# Patient Record
Sex: Male | Born: 1965 | Race: White | Hispanic: No | Marital: Married | Smoking: Current every day smoker
Health system: Southern US, Community
[De-identification: ages and names within clinical notes are randomized; demographics above are authoritative.]

## PROBLEM LIST (undated history)

## (undated) DIAGNOSIS — K219 Gastro-esophageal reflux disease without esophagitis: Secondary | ICD-10-CM

## (undated) DIAGNOSIS — Z87442 Personal history of urinary calculi: Secondary | ICD-10-CM

## (undated) DIAGNOSIS — F419 Anxiety disorder, unspecified: Secondary | ICD-10-CM

## (undated) DIAGNOSIS — I10 Essential (primary) hypertension: Secondary | ICD-10-CM

## (undated) DIAGNOSIS — K5792 Diverticulitis of intestine, part unspecified, without perforation or abscess without bleeding: Secondary | ICD-10-CM

## (undated) DIAGNOSIS — E7800 Pure hypercholesterolemia, unspecified: Secondary | ICD-10-CM

## (undated) DIAGNOSIS — Z9861 Coronary angioplasty status: Principal | ICD-10-CM

## (undated) DIAGNOSIS — I499 Cardiac arrhythmia, unspecified: Secondary | ICD-10-CM

## (undated) DIAGNOSIS — M1990 Unspecified osteoarthritis, unspecified site: Secondary | ICD-10-CM

## (undated) DIAGNOSIS — R079 Chest pain, unspecified: Secondary | ICD-10-CM

## (undated) DIAGNOSIS — I2510 Atherosclerotic heart disease of native coronary artery without angina pectoris: Principal | ICD-10-CM

## (undated) HISTORY — PX: TONSILLECTOMY: SUR1361

## (undated) HISTORY — PX: COLONOSCOPY: SHX174

## (undated) HISTORY — DX: Coronary angioplasty status: Z98.61

## (undated) HISTORY — DX: Atherosclerotic heart disease of native coronary artery without angina pectoris: I25.10

---

## 2002-10-28 ENCOUNTER — Emergency Department (HOSPITAL_COMMUNITY): Admission: EM | Admit: 2002-10-28 | Discharge: 2002-10-28 | Payer: Self-pay | Admitting: *Deleted

## 2002-10-28 ENCOUNTER — Encounter: Payer: Self-pay | Admitting: *Deleted

## 2004-03-12 ENCOUNTER — Emergency Department (HOSPITAL_COMMUNITY): Admission: EM | Admit: 2004-03-12 | Discharge: 2004-03-12 | Payer: Self-pay | Admitting: Emergency Medicine

## 2004-05-20 ENCOUNTER — Emergency Department (HOSPITAL_COMMUNITY): Admission: EM | Admit: 2004-05-20 | Discharge: 2004-05-20 | Payer: Self-pay | Admitting: Emergency Medicine

## 2004-05-22 ENCOUNTER — Emergency Department (HOSPITAL_COMMUNITY): Admission: EM | Admit: 2004-05-22 | Discharge: 2004-05-22 | Payer: Self-pay | Admitting: *Deleted

## 2004-05-23 ENCOUNTER — Inpatient Hospital Stay (HOSPITAL_COMMUNITY): Admission: EM | Admit: 2004-05-23 | Discharge: 2004-05-26 | Payer: Self-pay | Admitting: Emergency Medicine

## 2004-11-12 ENCOUNTER — Emergency Department (HOSPITAL_COMMUNITY): Admission: EM | Admit: 2004-11-12 | Discharge: 2004-11-12 | Payer: Self-pay | Admitting: Emergency Medicine

## 2005-07-11 ENCOUNTER — Emergency Department (HOSPITAL_COMMUNITY): Admission: EM | Admit: 2005-07-11 | Discharge: 2005-07-11 | Payer: Self-pay | Admitting: Gastroenterology

## 2005-07-18 ENCOUNTER — Emergency Department (HOSPITAL_COMMUNITY): Admission: EM | Admit: 2005-07-18 | Discharge: 2005-07-18 | Payer: Self-pay | Admitting: Emergency Medicine

## 2005-07-26 ENCOUNTER — Emergency Department (HOSPITAL_COMMUNITY): Admission: EM | Admit: 2005-07-26 | Discharge: 2005-07-26 | Payer: Self-pay | Admitting: Emergency Medicine

## 2005-10-04 ENCOUNTER — Emergency Department (HOSPITAL_COMMUNITY): Admission: EM | Admit: 2005-10-04 | Discharge: 2005-10-04 | Payer: Self-pay | Admitting: Emergency Medicine

## 2005-12-19 ENCOUNTER — Emergency Department (HOSPITAL_COMMUNITY): Admission: EM | Admit: 2005-12-19 | Discharge: 2005-12-19 | Payer: Self-pay | Admitting: Emergency Medicine

## 2007-06-03 ENCOUNTER — Emergency Department (HOSPITAL_COMMUNITY): Admission: EM | Admit: 2007-06-03 | Discharge: 2007-06-03 | Payer: Self-pay | Admitting: Family Medicine

## 2008-05-28 ENCOUNTER — Emergency Department (HOSPITAL_COMMUNITY): Admission: EM | Admit: 2008-05-28 | Discharge: 2008-05-28 | Payer: Self-pay | Admitting: Emergency Medicine

## 2008-05-29 ENCOUNTER — Emergency Department (HOSPITAL_COMMUNITY): Admission: EM | Admit: 2008-05-29 | Discharge: 2008-05-29 | Payer: Self-pay | Admitting: Emergency Medicine

## 2008-09-15 ENCOUNTER — Emergency Department (HOSPITAL_COMMUNITY): Admission: EM | Admit: 2008-09-15 | Discharge: 2008-09-15 | Payer: Self-pay | Admitting: *Deleted

## 2009-05-13 HISTORY — PX: TRANSTHORACIC ECHOCARDIOGRAM: SHX275

## 2009-08-29 ENCOUNTER — Ambulatory Visit (HOSPITAL_COMMUNITY): Admission: RE | Admit: 2009-08-29 | Discharge: 2009-08-29 | Payer: Self-pay | Admitting: Gastroenterology

## 2009-09-02 ENCOUNTER — Ambulatory Visit (HOSPITAL_COMMUNITY): Admission: RE | Admit: 2009-09-02 | Discharge: 2009-09-02 | Payer: Self-pay | Admitting: Gastroenterology

## 2009-09-07 DIAGNOSIS — Z9861 Coronary angioplasty status: Secondary | ICD-10-CM

## 2009-09-07 DIAGNOSIS — I2510 Atherosclerotic heart disease of native coronary artery without angina pectoris: Secondary | ICD-10-CM

## 2009-09-07 HISTORY — DX: Coronary angioplasty status: Z98.61

## 2009-09-07 HISTORY — DX: Atherosclerotic heart disease of native coronary artery without angina pectoris: I25.10

## 2009-09-07 HISTORY — PX: CORONARY ANGIOPLASTY WITH STENT PLACEMENT: SHX49

## 2009-09-09 ENCOUNTER — Encounter: Admission: RE | Admit: 2009-09-09 | Discharge: 2009-09-09 | Payer: Self-pay | Admitting: Cardiology

## 2009-09-15 DIAGNOSIS — I2510 Atherosclerotic heart disease of native coronary artery without angina pectoris: Secondary | ICD-10-CM | POA: Diagnosis present

## 2009-09-16 ENCOUNTER — Inpatient Hospital Stay (HOSPITAL_COMMUNITY): Admission: AD | Admit: 2009-09-16 | Discharge: 2009-09-17 | Payer: Self-pay | Admitting: Cardiology

## 2011-01-04 HISTORY — PX: NM MYOCAR PERF WALL MOTION: HXRAD629

## 2011-01-09 LAB — CBC
HCT: 41.1 % (ref 39.0–52.0)
Hemoglobin: 14.4 g/dL (ref 13.0–17.0)
MCHC: 34.9 g/dL (ref 30.0–36.0)
MCV: 91.7 fL (ref 78.0–100.0)
Platelets: 169 10*3/uL (ref 150–400)
RBC: 4.49 MIL/uL (ref 4.22–5.81)
RDW: 12.4 % (ref 11.5–15.5)
WBC: 8.3 10*3/uL (ref 4.0–10.5)

## 2011-01-09 LAB — BASIC METABOLIC PANEL
BUN: 10 mg/dL (ref 6–23)
CO2: 25 mEq/L (ref 19–32)
Calcium: 8.6 mg/dL (ref 8.4–10.5)
Chloride: 106 mEq/L (ref 96–112)
Creatinine, Ser: 0.98 mg/dL (ref 0.4–1.5)
GFR calc Af Amer: >60 mL/min (ref >60)
GFR calc non Af Amer: >60 mL/min (ref >60)
Glucose, Bld: 93 mg/dL (ref 70–99)
Potassium: 4.4 mEq/L (ref 3.5–5.1)
Sodium: 137 mEq/L (ref 135–145)

## 2011-01-09 LAB — CARDIAC PANEL(CRET KIN+CKTOT+MB+TROPI)
CK, MB: 0.9 ng/mL (ref 0.3–4.0)
CK, MB: 1.1 ng/mL (ref 0.3–4.0)
Relative Index: INVALID (ref 0.0–2.5)
Relative Index: INVALID (ref 0.0–2.5)
Total CK: 68 U/L (ref 7–232)
Total CK: 78 U/L (ref 7–232)
Troponin I: 0.02 ng/mL (ref 0.00–0.06)
Troponin I: 0.02 ng/mL (ref 0.00–0.06)

## 2011-02-23 NOTE — Discharge Summary (Signed)
Eric Savage, Eric Savage              ACCOUNT NO.:  [id number]   MEDICAL RECORD NO.:  [id number]          PATIENT TYPE:  INP   LOCATION:  A331                          FACILITY:  [facility]   PHYSICIAN:  Patrica Duel, M.D.    DATE OF BIRTH:  01-12-1966   DATE OF ADMISSION:  05/23/2004  DATE OF DISCHARGE:  08/19/2005LH                                 DISCHARGE SUMMARY   DISCHARGE DIAGNOSES:  1.  Refractory cellulitis, secondary to abrasion of his left arm.  Possible      sepsis.  2.  Question the validity of blood culture result.   For details regarding admission, please refer to the admitting note.  Briefly, this 45 year old male with a totally benign history, from a  trailer, striking his left arm on a large rock approximately days prior to  this admission.   The patient sustained a deep abrasion as well as puncture wound, which was  contaminated with debris.  He was seen in the emergency department the day  of the injury, and the wound was thoroughly irrigated and loosely  approximated with suture.  He was given Keflex prophylactically.  He was  seen again in the emergency department and administered Ancef IV.  He  returned back to the emergency department on the day of admission with  increasingly severe edema, pain and obvious cellulitis of his left arm.  He  was admitted for IV antibiotics.   HOSPITAL COURSE:  The patient was empirically treated with Levaquin and  doxycycline.  Dr. Romeo Apple was consulted who agreed with current management.  He developed fever, and a blood culture was positive for gram-positive rods.  His fever promptly resolved.  Dr. Romeo Apple left town.  Dr. Hilda Lias saw the patient in consultation and  felt the management was appropriate at that time.  He began to improve and  continued to improve until the day of discharge.  He was stable for  discharge on the fifth hospital day.  Appropriate wound care was undertaken  at the onset of this admission, and he has  responded well.   DISCHARGE MEDICATIONS:  1.  Levaquin 500 daily.  2.  Doxycycline 100 b.i.d.  3.  Vicodin p.r.n. pain.   FOLLOWUP:  He will be followed closely in the office and treated expectantly  as an outpatient.      MC/MEDQ  D:  07/18/2004  T:  07/18/2004  Job:  [id number]

## 2011-02-23 NOTE — H&P (Signed)
NAME:  Eric Savage, Eric Savage                        ACCOUNT NO.:  [id number]   MEDICAL RECORD NO.:  [id number]                   PATIENT TYPE:  INP   LOCATION:  A331                                 FACILITY:  [facility]   PHYSICIAN:  Patrica Duel, M.D.                 DATE OF BIRTH:  1966/03/06   DATE OF ADMISSION:  05/23/2004  DATE OF DISCHARGE:                                HISTORY & PHYSICAL   CHIEF COMPLAINT:  Arm pain.   HISTORY OF PRESENT ILLNESS:  This is a 45 year old male with a benign past  history.   The patient presented to the emergency department four days prior to this  admission after having fallen from a trailer, striking his left arm on a  large rock.  He sustained a deep abrasion as well as a puncture wound which  was contaminated with debris.  In the emergency department, the patient saw  P.A. Triplett.  The wound was thoroughly irrigated and loosely approximated  with sutures.  He was given Keflex prophylactically.  He followed up as  instructed and was found to have developing cellulitis.  He was given Ancef  IV.  He returned to the emergency department once again with increasingly  severe swelling and pain of his left arm.   Given the refractory nature of the cellulitis, the patient is admitted for  IV antibiotics.  We also need to consider the possibility of a septic joint  as well as retained foreign body contributing to refractory cellulitis.   There is no history of headache, neurologic deficits, chest pain, shortness  of breath, nausea, vomiting, diarrhea, melena, hematemesis, hematochezia, or  genitourinary symptoms.   CURRENT MEDICATIONS:  None.   ALLERGIES:  NONE.   PAST MEDICAL HISTORY:  As noted.  He underwent tonsillectomy as a young  child.   FAMILY HISTORY:  Noncontributory.   REVIEW OF SYSTEMS:  Negative, except as mentioned.   PHYSICAL EXAMINATION:  GENERAL:  A very pleasant male in no acute distress.  VITAL SIGNS:  The patient was febrile  upon presentation with a temperature  of 100 degrees.  Vitals otherwise normal.  HEENT:  Normocephalic, atraumatic.  Pupils are equal.  Ears, nose, and  throat benign.  NECK:  Supple.  No bruits, thyromegaly, or lymphadenopathy.  LUNGS:  Clear to A&P.  HEART:  Heart sounds are normal.  ABDOMEN:  Benign.  EXTREMITIES:  Marked erythema and tenderness of the forearm extending to the  extensor surface of the upper arm.  The joint itself seems somewhat boggy  and tender.  There is a slight discharge from an abrasion which is distal to  the olecranon region.  No frank pus is noted.  No epitrochlear or axillary  nodes are palpable.   ASSESSMENT:  Refractory cellulitis.  Question of septic joint versus  retained foreign body.   PLAN:  IV antibiotics (Levaquin as well as doxycycline).  Dr. Romeo Apple  will  be consulted to possibly explore the wound for debridement as well as to  access the possibility of retained foreign body and consider septic joint.  Will follow and treat expectantly.     ___________________________________________                                         Patrica Duel, M.D.   MC/MEDQ  D:  05/24/2004  T:  05/24/2004  Job:  [id number]

## 2011-02-23 NOTE — Consult Note (Signed)
NAME:  Eric Savage, Eric Savage                        ACCOUNT NO.:  [id number]   MEDICAL RECORD NO.:  [id number]                   PATIENT TYPE:  INP   LOCATION:  A331                                 FACILITY:  [facility]   PHYSICIAN:  J. Darreld Mclean, M.D.              DATE OF BIRTH:  1966-03-19   DATE OF CONSULTATION:  05/26/2004  DATE OF DISCHARGE:                                   CONSULTATION   REQUESTING PHYSICIAN:  Patrica Duel, M.D.   CHIEF COMPLAINT:  Infected left arm.   HISTORY:  The patient had fallen over a trailer and had multiple small loose  bodies in his left forearm near the elbow area on the posterior aspect.  He  has had an infection. He was seen by Dr. Romeo Apple on the 17th.  Cultures  have been obtained and these are negative.  His blood cultures were  bacillus. The patient is afebrile. He is on Vibramycin and has had  significant improvement in the wound.  The question is, does he need a  debridement.  Dr. Romeo Apple is out of town and Dr. Nobie Putnam has asked that I  see the patient.   The patient says that the swelling is going down progressively.  It is even  much less swollen than it was this morning.  He has full range of motion, in  fact, I saw him downstairs near x-ray when I was walking into the hospital.  The patient recognized me.  He has some slight swelling around his hand.  The patient says that you can see his knuckles now, and this morning he  could not.  He has full motion of his fingers.  He said this morning he  could barely make a fist.  He is feeling much better.  The wound, itself,  looks good.  There is no erythema nor purulence.  I have reviewed the x-rays  and these are negative except for soft tissue swelling. He has excellent  range of motion of the elbow with some tenderness, full supination, and full  flexion.  He lacks full extension by approximately 5 degrees. He does have  some slight erythema and slight edema. There is no discharge.   Cultures today are negative except for 1 blood culture which is showing only  a bacillus species.  He is afebrile and has been so for the last 2 days. He  appears to have had excellent response.  He has been shifted to Vibramycin.   IMPRESSION:  Resolving infection left elbow, responding well to Vibramycin.   PLAN:  Continue IV with Vibramycin for at least 6-7 more days.  Would not  recommend an incision, drainage, or debridement at this time.  The patient  says that he has a job in [place].  He has to start Monday, he has to be  there Monday.  Arrangements could be made for IV therapy as an outpatient.  I have  talked to pharmacy and they said that it could be arranged if the  times were set and arranged for the patient's benefit and comfort.   If the wound gets worse or any troubles they are to let me know.  Otherwise  I think that he will be okay to be discharged as an outpatient.     ___________________________________________                                            Teola Bradley, M.D.   JWK/MEDQ  D:  05/26/2004  T:  05/26/2004  Job:  [id number]

## 2011-07-13 LAB — URINALYSIS, ROUTINE W REFLEX MICROSCOPIC
Glucose, UA: NEGATIVE mg/dL
Ketones, ur: NEGATIVE mg/dL
Leukocytes, UA: NEGATIVE
Nitrite: NEGATIVE
Protein, ur: 30 mg/dL — AB
Specific Gravity, Urine: 1.037 — ABNORMAL HIGH (ref 1.005–1.030)
Urobilinogen, UA: 0.2 mg/dL (ref 0.0–1.0)
pH: 6 (ref 5.0–8.0)

## 2011-07-13 LAB — URINE MICROSCOPIC-ADD ON

## 2012-05-16 ENCOUNTER — Inpatient Hospital Stay (HOSPITAL_COMMUNITY)
Admission: EM | Admit: 2012-05-16 | Discharge: 2012-05-17 | DRG: 133 | Disposition: A | Discharge status: Home / Self Care | Payer: BC Managed Care – PPO | Attending: Cardiology | Admitting: Cardiology

## 2012-05-16 ENCOUNTER — Emergency Department (HOSPITAL_COMMUNITY): Payer: BC Managed Care – PPO

## 2012-05-16 ENCOUNTER — Encounter (HOSPITAL_COMMUNITY): Payer: Self-pay | Admitting: *Deleted

## 2012-05-16 ENCOUNTER — Other Ambulatory Visit: Payer: Self-pay

## 2012-05-16 DIAGNOSIS — R7309 Other abnormal glucose: Secondary | ICD-10-CM | POA: Diagnosis present

## 2012-05-16 DIAGNOSIS — I10 Essential (primary) hypertension: Secondary | ICD-10-CM | POA: Diagnosis present

## 2012-05-16 DIAGNOSIS — Z9861 Coronary angioplasty status: Secondary | ICD-10-CM

## 2012-05-16 DIAGNOSIS — K219 Gastro-esophageal reflux disease without esophagitis: Secondary | ICD-10-CM | POA: Diagnosis present

## 2012-05-16 DIAGNOSIS — I2510 Atherosclerotic heart disease of native coronary artery without angina pectoris: Principal | ICD-10-CM | POA: Diagnosis present

## 2012-05-16 DIAGNOSIS — E785 Hyperlipidemia, unspecified: Secondary | ICD-10-CM | POA: Diagnosis present

## 2012-05-16 DIAGNOSIS — F17200 Nicotine dependence, unspecified, uncomplicated: Secondary | ICD-10-CM | POA: Diagnosis present

## 2012-05-16 DIAGNOSIS — I200 Unstable angina: Secondary | ICD-10-CM

## 2012-05-16 HISTORY — DX: Essential (primary) hypertension: I10

## 2012-05-16 HISTORY — DX: Gastro-esophageal reflux disease without esophagitis: K21.9

## 2012-05-16 LAB — URINALYSIS, ROUTINE W REFLEX MICROSCOPIC
Bilirubin Urine: NEGATIVE
Glucose, UA: NEGATIVE mg/dL
Hgb urine dipstick: NEGATIVE
Ketones, ur: NEGATIVE mg/dL
Leukocytes, UA: NEGATIVE
Nitrite: NEGATIVE
Protein, ur: NEGATIVE mg/dL
Specific Gravity, Urine: 1.024 (ref 1.005–1.030)
Urobilinogen, UA: 1 mg/dL (ref 0.0–1.0)
pH: 6.5 (ref 5.0–8.0)

## 2012-05-16 LAB — CBC
HCT: 45.3 % (ref 39.0–52.0)
Hemoglobin: 15.5 g/dL (ref 13.0–17.0)
MCH: 30.9 pg (ref 26.0–34.0)
MCHC: 34.2 g/dL (ref 30.0–36.0)
MCV: 90.2 fL (ref 78.0–100.0)
Platelets: 218 10*3/uL (ref 150–400)
RBC: 5.02 MIL/uL (ref 4.22–5.81)
RDW: 12.3 % (ref 11.5–15.5)
WBC: 9.9 10*3/uL (ref 4.0–10.5)

## 2012-05-16 LAB — POCT I-STAT, CHEM 8
BUN: 16 mg/dL (ref 6–23)
Calcium, Ion: 1.25 mmol/L — ABNORMAL HIGH (ref 1.12–1.23)
Chloride: 105 mEq/L (ref 96–112)
Creatinine, Ser: 1.1 mg/dL (ref 0.50–1.35)
Glucose, Bld: 159 mg/dL — ABNORMAL HIGH (ref 70–99)
HCT: 45 % (ref 39.0–52.0)
Hemoglobin: 15.3 g/dL (ref 13.0–17.0)
Potassium: 4.1 mEq/L (ref 3.5–5.1)
Sodium: 140 mEq/L (ref 135–145)
TCO2: 23 mmol/L (ref 0–100)

## 2012-05-16 LAB — POCT I-STAT TROPONIN I: Troponin i, poc: 0 ng/mL (ref 0.00–0.08)

## 2012-05-16 LAB — RAPID URINE DRUG SCREEN, HOSP PERFORMED
Amphetamines: NOT DETECTED
Barbiturates: NOT DETECTED
Benzodiazepines: NOT DETECTED
Cocaine: NOT DETECTED
Opiates: NOT DETECTED
Tetrahydrocannabinol: POSITIVE — AB

## 2012-05-16 LAB — TROPONIN I
Troponin I: <0.3 ng/mL (ref <0.30)
Troponin I: <0.3 ng/mL (ref <0.30)

## 2012-05-16 LAB — MRSA PCR SCREENING: MRSA by PCR: NEGATIVE

## 2012-05-16 MED ORDER — METOPROLOL TARTRATE 12.5 MG HALF TABLET
12.5000 mg | ORAL_TABLET | Freq: Two times a day (BID) | ORAL | Status: DC
  Administered 2012-05-16: 12.5 mg via ORAL
  Filled 2012-05-16 (×3): qty 1

## 2012-05-16 MED ORDER — SODIUM CHLORIDE 0.9 % IV SOLN: INTRAVENOUS | Status: DC | PRN

## 2012-05-16 MED ORDER — ASPIRIN 325 MG PO TABS
325.0000 mg | ORAL_TABLET | Freq: Every day | ORAL | Status: DC
  Administered 2012-05-17: 325 mg via ORAL
  Filled 2012-05-16: qty 1

## 2012-05-16 MED ORDER — NITROGLYCERIN IN D5W 200-5 MCG/ML-% IV SOLN
2.0000 ug/min | INTRAVENOUS | Status: DC
  Administered 2012-05-16 (×2): 2 ug/min via INTRAVENOUS
  Filled 2012-05-16: qty 250

## 2012-05-16 MED ORDER — ACETAMINOPHEN 325 MG PO TABS: 650.0000 mg | ORAL_TABLET | ORAL | Status: DC | PRN

## 2012-05-16 MED ORDER — LISINOPRIL 40 MG PO TABS
40.0000 mg | ORAL_TABLET | Freq: Every day | ORAL | Status: DC
  Administered 2012-05-16 – 2012-05-17 (×2): 40 mg via ORAL
  Filled 2012-05-16 (×2): qty 1

## 2012-05-16 MED ORDER — SIMVASTATIN 40 MG PO TABS
40.0000 mg | ORAL_TABLET | Freq: Every day | ORAL | Status: DC
  Filled 2012-05-16: qty 1

## 2012-05-16 MED ORDER — HEPARIN (PORCINE) IN NACL 100-0.45 UNIT/ML-% IJ SOLN
1400.0000 [IU]/h | INTRAMUSCULAR | Status: DC
  Administered 2012-05-16 (×2): 900 [IU]/h via INTRAVENOUS
  Administered 2012-05-17: 1400 [IU]/h via INTRAVENOUS
  Filled 2012-05-16 (×3): qty 250

## 2012-05-16 MED ORDER — PANTOPRAZOLE SODIUM 40 MG PO TBEC
40.0000 mg | DELAYED_RELEASE_TABLET | Freq: Every day | ORAL | Status: DC
  Administered 2012-05-17: 40 mg via ORAL
  Filled 2012-05-16: qty 1

## 2012-05-16 MED ORDER — HEPARIN BOLUS VIA INFUSION
4000.0000 [IU] | Freq: Once | INTRAVENOUS | Status: AC
  Administered 2012-05-16: 4000 [IU] via INTRAVENOUS

## 2012-05-16 MED ORDER — ZOLPIDEM TARTRATE 5 MG PO TABS: 10.0000 mg | ORAL_TABLET | Freq: Every evening | ORAL | Status: DC | PRN

## 2012-05-16 MED ORDER — TRAMADOL HCL 50 MG PO TABS
50.0000 mg | ORAL_TABLET | Freq: Four times a day (QID) | ORAL | Status: DC | PRN
  Filled 2012-05-16: qty 1

## 2012-05-16 MED ORDER — ALPRAZOLAM 0.25 MG PO TABS
0.2500 mg | ORAL_TABLET | Freq: Three times a day (TID) | ORAL | Status: DC | PRN
  Administered 2012-05-16: 0.25 mg via ORAL
  Filled 2012-05-16: qty 1

## 2012-05-16 MED ORDER — CLOPIDOGREL BISULFATE 75 MG PO TABS
75.0000 mg | ORAL_TABLET | Freq: Every day | ORAL | Status: DC
  Administered 2012-05-16 – 2012-05-17 (×2): 75 mg via ORAL
  Filled 2012-05-16 (×2): qty 1

## 2012-05-16 MED ORDER — ASPIRIN 81 MG PO CHEW
324.0000 mg | CHEWABLE_TABLET | Freq: Once | ORAL | Status: AC
  Administered 2012-05-16: 324 mg via ORAL
  Filled 2012-05-16: qty 4

## 2012-05-16 NOTE — ED Notes (Signed)
Phlebotomy at the bedside  

## 2012-05-16 NOTE — ED Notes (Signed)
Pt eating his lunch  

## 2012-05-16 NOTE — ED Provider Notes (Signed)
Medical screening examination/treatment/procedure(s) were performed by non-physician practitioner and as supervising physician I was immediately available for consultation/collaboration.   Richardean Canal, MD 05/16/12 [phone]

## 2012-05-16 NOTE — ED Provider Notes (Signed)
History     CSN: [id number]  Arrival date & time 05/16/12  1353   First MD Initiated Contact with Patient 05/16/12 1501      Chief Complaint  Patient presents with  . Chest Pain    (Consider location/radiation/quality/duration/timing/severity/associated sxs/prior treatment) HPI Hx from pt. Eric Savage is a 46 y.o. male with hx CAD and stenting who presents with chest pain. States that this began yesterday afternoon and was intermittent in nature. Pain lasts for about 5-10 seconds at a time and is aching in nature. It is brought on by exertion. It is located substernally with occasional radiation to the neck and jaw. It is followed by a sensation as if he needs to belch. He states it does not feel c/w acid reflux. He states it does feel somewhat similar to his previous anginal pain. No associated nausea, vomiting, dyspnea, diaphoresis. No medications taken at home prior to coming here.  Main cardiologist is Dr. Clarene Duke, but he has not seen Dr. Clarene Duke in a while. He was last cathed in 2010 by Dr. Tresa Endo and had drug eluting stents placed to the L circumflex and RAD; he had a 50-60% stenosis of the LAD.  Past Medical History  Diagnosis Date  . Acid reflux   . Hypertension   . Coronary artery disease     Past Surgical History  Procedure Date  . Angioplasty     History reviewed. No pertinent family history.  History  Substance Use Topics  . Smoking status: Current Everyday Smoker  . Smokeless tobacco: Not on file  . Alcohol Use: Yes      Review of Systems  Constitutional: Negative for fever, chills and appetite change.  HENT: Positive for neck pain. Negative for congestion, sore throat and trouble swallowing.   Respiratory: Negative for cough, chest tightness, shortness of breath and wheezing.   Cardiovascular: Positive for chest pain. Negative for palpitations and leg swelling.  Gastrointestinal: Negative for nausea, vomiting and abdominal pain.       Belching    Musculoskeletal: Negative for myalgias.  Skin: Negative for color change and rash.  Neurological: Negative for dizziness, syncope and weakness.  All other systems reviewed and are negative.    Allergies  Review of patient's allergies indicates no known allergies.  Home Medications   Current Outpatient Rx  Name Route Sig Dispense Refill  . CLOPIDOGREL BISULFATE 75 MG PO TABS Oral Take 75 mg by mouth daily.    Marland Kitchen ESOMEPRAZOLE MAGNESIUM 40 MG PO CPDR Oral Take 40 mg by mouth daily before breakfast.    . LISINOPRIL 40 MG PO TABS Oral Take 40 mg by mouth daily.      BP 125/71  Pulse 73  Temp 98.6 F (37 C) (Oral)  Resp 14  SpO2 100%  Physical Exam  Nursing note and vitals reviewed. Constitutional: He appears well-developed and well-nourished. No distress.  HENT:  Head: Normocephalic and atraumatic.  Mouth/Throat: Oropharynx is clear and moist. No oropharyngeal exudate.  Eyes:       Normal appearance  Neck: Normal range of motion.  Cardiovascular: Normal rate, regular rhythm and normal heart sounds.   Pulmonary/Chest: Effort normal and breath sounds normal. He exhibits no tenderness.  Abdominal: Soft. Bowel sounds are normal. There is no tenderness. There is no rebound and no guarding.  Musculoskeletal: Normal range of motion.  Neurological: He is alert.  Skin: Skin is warm and dry. He is not diaphoretic.  Psychiatric: He has a normal mood and affect.  ED Course  Procedures (including critical care time)   Date: 05/16/2012  Rate: 73  Rhythm: normal sinus rhythm  QRS Axis: normal  Intervals: normal  ST/T Wave abnormalities: normal  Conduction Disutrbances:none  Narrative Interpretation:   Old EKG Reviewed: since last tracing rate increased  Labs Reviewed  POCT I-STAT, CHEM 8 - Abnormal; Notable for the following:    Glucose, Bld 159 (*)     Calcium, Ion 1.25 (*)     All other components within normal limits  CBC  POCT I-STAT TROPONIN I  URINE RAPID DRUG  SCREEN (HOSP PERFORMED)   Dg Chest 2 View  05/16/2012  *RADIOLOGY REPORT*  Clinical Data: Chest pain.  Neck pain.  Jaw pain and headache.  CHEST - 2 VIEW  Comparison: 09/09/2009.  Findings:  Cardiopericardial silhouette within normal limits. Mediastinal contours normal. Trachea midline.  No airspace disease or effusion.  IMPRESSION: No active cardiopulmonary disease.  Original Report Authenticated By: Andreas Newport, M.D.     No diagnosis found.    MDM  Pt with hx of stents who presents with new exertional chest pain, which feels similar to previous anginal pain. Currently CP free at this time. ECG unchanged from previous. POC troponin negative. Will contact [facility] to discuss.  3:35 PM Discussed with Corine Shelter, PA-C. He and consult team will be by shortly to see.  4:44 PM Pt will be admitted for MI r/o.  Grant Fontana, PA-C 05/16/12 1644

## 2012-05-16 NOTE — ED Notes (Signed)
Substernal cp, belching after the pain; took nothing for pain.

## 2012-05-16 NOTE — ED Notes (Signed)
Requested specimen, patient unable to urinate at this time

## 2012-05-16 NOTE — ED Notes (Signed)
Pt pulled to room from triage and taken to xray

## 2012-05-16 NOTE — Progress Notes (Signed)
ANTICOAGULATION CONSULT NOTE - Initial Consult  Pharmacy Consult for Heparin Indication: chest pain/ACS  No Known Allergies  Patient Measurements: Height: 5\' 6"  (167.6 cm) Weight: 162 lb (73.483 kg) IBW/kg (Calculated) : 63.8   Vital Signs: Temp: 98.6 F (37 C) (08/09 1354) Temp src: Oral (08/09 1354) BP: 136/91 mmHg (08/09 1611) Pulse Rate: 59  (08/09 1611)  Labs:  Basename 05/16/12 1435 05/16/12 1402  HGB 15.3 15.5  HCT 45.0 45.3  PLT -- 218  APTT -- --  LABPROT -- --  INR -- --  HEPARINUNFRC -- --  CREATININE 1.10 --  CKTOTAL -- --  CKMB -- --  TROPONINI -- --    Estimated Creatinine Clearance: 75.7 ml/min (by C-G formula based on Cr of 1.1).   Medical History: Past Medical History  Diagnosis Date  . Acid reflux   . Hypertension   . Coronary artery disease     Medications:  Plavix, Nexium, Lisinopril  Assessment: 46 y.o. male presents with chest pain. H/o CAD with DES 12/10. To begin heparin for r/o ACS.  Goal of Therapy:  Heparin level 0.3-0.7 units/ml Monitor platelets by anticoagulation protocol: Yes   Plan:  1. Heparin 4000 units IV bolus 2. Heparin gtt at 900 units/hr 3. Heparin level in 6 hours 4. Daily heparin level and CBC  Christoper Fabian, PharmD, BCPS Clinical pharmacist, pager [phone] 05/16/2012,4:56 PM

## 2012-05-16 NOTE — H&P (Signed)
Patient ID: Eric Savage MRN: [id number], DOB/AGE: 01/25/1966   Admit date: 05/16/2012   Primary Physician: ELMAHDY,WAGDY, MD Primary Cardiologist: Dr Little  HPI: 46 y/o with a history of CAD. He had an RCA/CFX DES 12/10 after a negative Nuclear study. He had residual LAD and Dx disease. He had an OP Myoview March 2012-"OK". He is seen now in the ER with complaints of two days of SSCP, "lasts 5 seconds", but not unlike his prior anginal symptoms. He did have some radiation to his jaw and throat. No associated N/V/D.   Problem List: Past Medical History  Diagnosis Date  . Acid reflux   . Hypertension   . Coronary artery disease     Past Surgical History  Procedure Date  . Angioplasty      Allergies: No Known Allergies   Home Medications  (Not in a hospital admission)   History reviewed. No pertinent family history.   History   Social History  . Marital Status: Single    Spouse Name: N/A    Number of Children: N/A  . Years of Education: N/A   Occupational History  . Not on file.   Social History Main Topics  . Smoking status: Current Everyday Smoker  . Smokeless tobacco: Not on file  . Alcohol Use: Yes  . Drug Use: 7 per week    Special: Marijuana     LSD  . Sexually Active:    Other Topics Concern  . Not on file   Social History Narrative  . No narrative on file     Review of Systems: General: negative for chills, fever, night sweats or weight changes.  Cardiovascular: negative for chest pain, dyspnea on exertion, edema, orthopnea, palpitations, paroxysmal nocturnal dyspnea or shortness of breath Dermatological: negative for rash Respiratory: negative for cough or wheezing Urologic: negative for hematuria Abdominal: negative for nausea, vomiting, diarrhea, bright red blood per rectum, melena, or hematemesis Neurologic: negative for visual changes, syncope, or dizziness All other systems reviewed and are otherwise negative except as noted  above.  Physical Exam: Blood pressure 136/91, pulse 59, temperature 98.6 F (37 C), temperature source Oral, resp. rate 16, SpO2 98.00%.  General appearance: alert, cooperative, appears stated age and no distress Neck: no carotid bruit, no JVD, supple, symmetrical, trachea midline and thyroid not enlarged, symmetric, no tenderness/mass/nodules Lungs: clear to auscultation bilaterally Heart: regular rate and rhythm Abdomen: soft, non-tender; bowel sounds normal; no masses,  no organomegaly Extremities: extremities normal, atraumatic, no cyanosis or edema Pulses: 2+ and symmetric Skin: Skin color, texture, turgor normal. No rashes or lesions Neurologic: Grossly normal    Labs:   Results for orders placed during the hospital encounter of 05/16/12 (from the past 24 hour(s))  CBC     Status: Normal   Collection Time   05/16/12  2:02 PM      Component Value Range   WBC 9.9  4.0 - 10.5 K/uL   RBC 5.02  4.22 - 5.81 MIL/uL   Hemoglobin 15.5  13.0 - 17.0 g/dL   HCT 45.3  39.0 - 52.0 %   MCV 90.2  78.0 - 100.0 fL   MCH 30.9  26.0 - 34.0 pg   MCHC 34.2  30.0 - 36.0 g/dL   RDW 12.3  11.5 - 15.5 %   Platelets 218  150 - 400 K/uL  POCT I-STAT TROPONIN I     Status: Normal   Collection Time   05/16/12  2:33 PM        Component Value Range   Troponin i, poc 0.00  0.00 - 0.08 ng/mL   Comment 3           POCT I-STAT, CHEM 8     Status: Abnormal   Collection Time   05/16/12  2:35 PM      Component Value Range   Sodium 140  135 - 145 mEq/L   Potassium 4.1  3.5 - 5.1 mEq/L   Chloride 105  96 - 112 mEq/L   BUN 16  6 - 23 mg/dL   Creatinine, Ser 1.10  0.50 - 1.35 mg/dL   Glucose, Bld 159 (*) 70 - 99 mg/dL   Calcium, Ion 1.25 (*) 1.12 - 1.23 mmol/L   TCO2 23  0 - 100 mmol/L   Hemoglobin 15.3  13.0 - 17.0 g/dL   HCT 45.0  39.0 - 52.0 %     Radiology/Studies: Dg Chest 2 View  05/16/2012  *RADIOLOGY REPORT*  Clinical Data: Chest pain.  Neck pain.  Jaw pain and headache.  CHEST - 2 VIEW   Comparison: 09/09/2009.  Findings:  Cardiopericardial silhouette within normal limits. Mediastinal contours normal. Trachea midline.  No airspace disease or effusion.  IMPRESSION: No active cardiopulmonary disease.  Original Report Authenticated By: GEOFFREY LAMKE, M.D.    EKG:NSR without acute changes   ASSESSMENT AND PLAN:  Principal Problem:  *Unstable angina Active Problems:  CAD (coronary artery disease), RCA/CFX DES 12/10 with residual 50-60% LAD.  HTN (hypertension)  Dyslipidemia, untreated  Smoker  Hyperglycemia- BS 159  Plan- Admit, Heparin, NTG, statin, ASA, low dose Metoprolol, R/O MI  Will check Hgb A1c.  Signed, KILROY,LUKE K, PA-C 05/16/2012, 4:24 PM  I have seen & examined the patient along with Mr. Kilroy.  I agree with his findings, exam & recommendations.  Essentially a 46 y/o man with h/o of known CAD - PCI to LCx & RCA in 2010 after "false negative Myoivew" ST.  Has been in [facility] until the past few weeks when he began to note intermittent Chest pains -- always with exertion.  Over the past few days, he has noted them with increased frequency & lasting longer.  Today, the Sx have started to be present @ rest, although always short-lived.  Says he has had a "few" since coming to the ER. He states that these Sx are very similar to his previous chest pains prior to his PCI.    He denies and CHF type Sx of edema, PND, orthopnea.  No palpitations, syncope or near-syncope.  He notes that he ran out of his Plavix Rx "a while ago."  I discussed with him the potential plan for admit - r/o MI, NTG  & Heparin.  Smoking cessation counseling.  As his Sx seem to be progressively worsening in nature and similar to his previous Anginal equivalents Sx, I agree that this should be considered Unstable Angina until proven otherwise.   He has had a false negative Myoview before with CP and a negative once over a year ago without CP.  He is quite skeptical of Myoview results, although with  multivessel disease, the likely reason for false negative reading would be balanced ischemia, which is less likely an issue after 2 vessel revascularization.  He is more in favor of invasive evaluation "to be sure", which is a reasonable approach for definitive anatomic evaluation.  We will admit with plan for LHC +/- PCI on Monday for Unstable Angina.  [name],DAVID W, M.D., M.S. [facility] [address] [place], [place]  [address]  [phone] Pager # [phone]  05/16/2012 5:26 PM   

## 2012-05-17 LAB — HEPARIN LEVEL (UNFRACTIONATED)
Heparin Unfractionated: 0.27 IU/mL — ABNORMAL LOW (ref 0.30–0.70)
Heparin Unfractionated: 0.29 IU/mL — ABNORMAL LOW (ref 0.30–0.70)
Heparin Unfractionated: <0.1 IU/mL — ABNORMAL LOW (ref 0.30–0.70)

## 2012-05-17 LAB — COMPREHENSIVE METABOLIC PANEL
ALT: 9 U/L (ref 0–53)
AST: 12 U/L (ref 0–37)
Albumin: 3.5 g/dL (ref 3.5–5.2)
Alkaline Phosphatase: 69 U/L (ref 39–117)
BUN: 17 mg/dL (ref 6–23)
CO2: 25 mEq/L (ref 19–32)
Calcium: 9.1 mg/dL (ref 8.4–10.5)
Chloride: 106 mEq/L (ref 96–112)
Creatinine, Ser: 0.91 mg/dL (ref 0.50–1.35)
GFR calc Af Amer: >90 mL/min (ref >90)
GFR calc non Af Amer: >90 mL/min (ref >90)
Glucose, Bld: 104 mg/dL — ABNORMAL HIGH (ref 70–99)
Potassium: 4.3 mEq/L (ref 3.5–5.1)
Sodium: 139 mEq/L (ref 135–145)
Total Bilirubin: 0.3 mg/dL (ref 0.3–1.2)
Total Protein: 5.6 g/dL — ABNORMAL LOW (ref 6.0–8.3)

## 2012-05-17 LAB — TROPONIN I: Troponin I: <0.3 ng/mL (ref <0.30)

## 2012-05-17 LAB — CBC
HCT: 42.8 % (ref 39.0–52.0)
Hemoglobin: 14.4 g/dL (ref 13.0–17.0)
MCH: 30.7 pg (ref 26.0–34.0)
MCHC: 33.6 g/dL (ref 30.0–36.0)
MCV: 91.3 fL (ref 78.0–100.0)
Platelets: 202 10*3/uL (ref 150–400)
RBC: 4.69 MIL/uL (ref 4.22–5.81)
RDW: 12.4 % (ref 11.5–15.5)
WBC: 9.1 10*3/uL (ref 4.0–10.5)

## 2012-05-17 LAB — PROTIME-INR
INR: 1.1 (ref 0.00–1.49)
Prothrombin Time: 14.4 seconds (ref 11.6–15.2)

## 2012-05-17 LAB — HEMOGLOBIN A1C
Hgb A1c MFr Bld: 5.5 % (ref <5.7)
Mean Plasma Glucose: 111 mg/dL (ref <117)

## 2012-05-17 MED ORDER — NITROGLYCERIN 0.4 MG SL SUBL: 0.4000 mg | SUBLINGUAL_TABLET | SUBLINGUAL | Status: DC | PRN

## 2012-05-17 MED ORDER — METOPROLOL TARTRATE 25 MG PO TABS: 12.5000 mg | ORAL_TABLET | Freq: Two times a day (BID) | ORAL | Status: DC

## 2012-05-17 MED ORDER — ISOSORBIDE MONONITRATE ER 30 MG PO TB24: 30.0000 mg | ORAL_TABLET | Freq: Every day | ORAL | Status: DC

## 2012-05-17 MED ORDER — HEPARIN BOLUS VIA INFUSION
2000.0000 [IU] | Freq: Once | INTRAVENOUS | Status: AC
  Administered 2012-05-17: 2000 [IU] via INTRAVENOUS
  Filled 2012-05-17: qty 2000

## 2012-05-17 MED ORDER — SIMVASTATIN 40 MG PO TABS: 40.0000 mg | ORAL_TABLET | Freq: Every day | ORAL | Status: DC

## 2012-05-17 MED ORDER — ASPIRIN 325 MG PO TABS: 325.0000 mg | ORAL_TABLET | Freq: Every day | ORAL | Status: AC

## 2012-05-17 NOTE — Discharge Summary (Signed)
Patient ID: CAS TRACZ,  MRN: [id number], DOB/AGE: 46-Jul-1967 46 y.o.  Admit date: 05/16/2012 Discharge date: 05/17/2012  Primary Care Provider:  Primary Cardiologist: Dr Herbie Baltimore  Discharge Diagnoses Principal Problem:  *Unstable angina Active Problems:  CAD (coronary artery disease), RCA/CFX DES 12/10 with residual 50-60% LAD.  HTN (hypertension)  Dyslipidemia, untreated  Smoker    Procedures: None   Hospital Course:  46 y/o with a history of CAD. He had an RCA/CFX DES 12/10 after a negative nuclear study. He had residual LAD and Dx disease of 50-60%. He had an OP Myoview March 2012-"OK". He is seen now in the ER with complaints of two days of SSCP, "lasts 5 seconds", but not unlike his prior anginal symptoms. He did have some radiation to his jaw and throat. No associated N/V/D. He was admitted to stepdown and started on Heparin and Nitro. His enzymes were negative. ASA and statin were added. He was to have a cath on Monday but insisted on going home Sat. Dr Herbie Baltimore spoke with the pt and feels he is OK for discharge on medical Rx with plans for cath Monday or Tuesday as an OP.   Discharge Vitals:  Blood pressure 112/68, pulse 58, temperature 98.2 F (36.8 C), temperature source Oral, resp. rate 18, height 5\' 6"  (1.676 m), weight 73.7 kg (162 lb 7.7 oz), SpO2 99.00%.    Labs: Results for orders placed during the hospital encounter of 05/16/12 (from the past 48 hour(s))  CBC     Status: Normal   Collection Time   05/16/12  2:02 PM      Component Value Range Comment   WBC 9.9  4.0 - 10.5 K/uL    RBC 5.02  4.22 - 5.81 MIL/uL    Hemoglobin 15.5  13.0 - 17.0 g/dL    HCT 78.2  95.6 - 21.3 %    MCV 90.2  78.0 - 100.0 fL    MCH 30.9  26.0 - 34.0 pg    MCHC 34.2  30.0 - 36.0 g/dL    RDW 08.6  57.8 - 46.9 %    Platelets 218  150 - 400 K/uL   POCT I-STAT TROPONIN I     Status: Normal   Collection Time   05/16/12  2:33 PM      Component Value Range Comment   Troponin i, poc 0.00   0.00 - 0.08 ng/mL    Comment 3            POCT I-STAT, CHEM 8     Status: Abnormal   Collection Time   05/16/12  2:35 PM      Component Value Range Comment   Sodium 140  135 - 145 mEq/L    Potassium 4.1  3.5 - 5.1 mEq/L    Chloride 105  96 - 112 mEq/L    BUN 16  6 - 23 mg/dL    Creatinine, Ser 6.29  0.50 - 1.35 mg/dL    Glucose, Bld 528 (*) 70 - 99 mg/dL    Calcium, Ion 4.13 (*) 1.12 - 1.23 mmol/L    TCO2 23  0 - 100 mmol/L    Hemoglobin 15.3  13.0 - 17.0 g/dL    HCT 24.4  01.0 - 27.2 %   URINE RAPID DRUG SCREEN (HOSP PERFORMED)     Status: Abnormal   Collection Time   05/16/12  4:10 PM      Component Value Range Comment   Opiates NONE DETECTED  NONE DETECTED  Cocaine NONE DETECTED  NONE DETECTED    Benzodiazepines NONE DETECTED  NONE DETECTED    Amphetamines NONE DETECTED  NONE DETECTED    Tetrahydrocannabinol POSITIVE (*) NONE DETECTED    Barbiturates NONE DETECTED  NONE DETECTED   TROPONIN I     Status: Normal   Collection Time   05/16/12  4:36 PM      Component Value Range Comment   Troponin I <0.30  <0.30 ng/mL   HEMOGLOBIN A1C     Status: Normal   Collection Time   05/16/12  4:37 PM      Component Value Range Comment   Hemoglobin A1C 5.5  <5.7 %    Mean Plasma Glucose 111  <117 mg/dL   TROPONIN I     Status: Normal   Collection Time   05/16/12  4:56 PM      Component Value Range Comment   Troponin I <0.30  <0.30 ng/mL   URINALYSIS, ROUTINE W REFLEX MICROSCOPIC     Status: Normal   Collection Time   05/16/12  5:02 PM      Component Value Range Comment   Color, Urine YELLOW  YELLOW    APPearance CLEAR  CLEAR    Specific Gravity, Urine 1.024  1.005 - 1.030    pH 6.5  5.0 - 8.0    Glucose, UA NEGATIVE  NEGATIVE mg/dL    Hgb urine dipstick NEGATIVE  NEGATIVE    Bilirubin Urine NEGATIVE  NEGATIVE    Ketones, ur NEGATIVE  NEGATIVE mg/dL    Protein, ur NEGATIVE  NEGATIVE mg/dL    Urobilinogen, UA 1.0  0.0 - 1.0 mg/dL    Nitrite NEGATIVE  NEGATIVE    Leukocytes, UA  NEGATIVE  NEGATIVE MICROSCOPIC NOT DONE ON URINES WITH NEGATIVE PROTEIN, BLOOD, LEUKOCYTES, NITRITE, OR GLUCOSE <1000 mg/dL.  MRSA PCR SCREENING     Status: Normal   Collection Time   05/16/12  8:18 PM      Component Value Range Comment   MRSA by PCR NEGATIVE  NEGATIVE   TROPONIN I     Status: Normal   Collection Time   05/16/12 11:48 PM      Component Value Range Comment   Troponin I <0.30  <0.30 ng/mL   HEPARIN LEVEL (UNFRACTIONATED)     Status: Abnormal   Collection Time   05/17/12 12:10 AM      Component Value Range Comment   Heparin Unfractionated <0.10 (*) 0.30 - 0.70 IU/mL   CBC     Status: Normal   Collection Time   05/17/12  5:30 AM      Component Value Range Comment   WBC 9.1  4.0 - 10.5 K/uL    RBC 4.69  4.22 - 5.81 MIL/uL    Hemoglobin 14.4  13.0 - 17.0 g/dL    HCT 16.1  09.6 - 04.5 %    MCV 91.3  78.0 - 100.0 fL    MCH 30.7  26.0 - 34.0 pg    MCHC 33.6  30.0 - 36.0 g/dL    RDW 40.9  81.1 - 91.4 %    Platelets 202  150 - 400 K/uL   COMPREHENSIVE METABOLIC PANEL     Status: Abnormal   Collection Time   05/17/12  5:30 AM      Component Value Range Comment   Sodium 139  135 - 145 mEq/L    Potassium 4.3  3.5 - 5.1 mEq/L    Chloride 106  96 -  112 mEq/L    CO2 25  19 - 32 mEq/L    Glucose, Bld 104 (*) 70 - 99 mg/dL    BUN 17  6 - 23 mg/dL    Creatinine, Ser 8.11  0.50 - 1.35 mg/dL    Calcium 9.1  8.4 - 91.4 mg/dL    Total Protein 5.6 (*) 6.0 - 8.3 g/dL    Albumin 3.5  3.5 - 5.2 g/dL    AST 12  0 - 37 U/L    ALT 9  0 - 53 U/L    Alkaline Phosphatase 69  39 - 117 U/L    Total Bilirubin 0.3  0.3 - 1.2 mg/dL    GFR calc non Af Amer >90  >90 mL/min    GFR calc Af Amer >90  >90 mL/min   HEPARIN LEVEL (UNFRACTIONATED)     Status: Abnormal   Collection Time   05/17/12  5:30 AM      Component Value Range Comment   Heparin Unfractionated 0.29 (*) 0.30 - 0.70 IU/mL   PROTIME-INR     Status: Normal   Collection Time   05/17/12  5:30 AM      Component Value Range Comment    Prothrombin Time 14.4  11.6 - 15.2 seconds    INR 1.10  0.00 - 1.49   HEPARIN LEVEL (UNFRACTIONATED)     Status: Abnormal   Collection Time   05/17/12  8:04 AM      Component Value Range Comment   Heparin Unfractionated 0.27 (*) 0.30 - 0.70 IU/mL     Disposition:  Follow-up Information    Follow up with [name],DAVID W, MD. (office will call)    Contact information:   [facility] [address], [address] [place] [address] [phone]          Discharge Medications:  Medication List  As of 05/17/2012 12:34 PM   TAKE these medications         aspirin 325 MG tablet   Take 1 tablet (325 mg total) by mouth daily.      clopidogrel 75 MG tablet   Commonly known as: PLAVIX   Take 75 mg by mouth daily.      esomeprazole 40 MG capsule   Commonly known as: NEXIUM   Take 40 mg by mouth daily before breakfast.      isosorbide mononitrate 30 MG 24 hr tablet   Commonly known as: IMDUR   Take 1 tablet (30 mg total) by mouth daily.      lisinopril 40 MG tablet   Commonly known as: PRINIVIL,ZESTRIL   Take 40 mg by mouth daily.      metoprolol tartrate 25 MG tablet   Commonly known as: LOPRESSOR   Take 0.5 tablets (12.5 mg total) by mouth 2 (two) times daily.      nitroGLYCERIN 0.4 MG SL tablet   Commonly known as: NITROSTAT   Place 1 tablet (0.4 mg total) under the tongue every 5 (five) minutes as needed for chest pain.      simvastatin 40 MG tablet   Commonly known as: ZOCOR   Take 1 tablet (40 mg total) by mouth daily at 6 PM.           Duration of Discharge Encounter: Greater than 30 minutes including physician time.  Leonides Schanz Corine Shelter PA-C 05/17/2012 12:34 PM  ATTENDING ATTESTATION:  As he has ruled out for MI, and his Sx are somewhat atypical (as was  the case in the past), he is relatively low risk from an ACS standpoint.  We spent some time discussing options for plan of care -- remaining inpatient on IV medications for  plan cath on Mon vs. Potential d/c home on increased Medical Rx (ASA/Plavix, Imdur & low dose BB) with plan for OP cath early next week. He would like to be able to go home today to take care of "family concerns" today, but is determined that he would like to proceed with cath (radial access) ASAP.  I do think that he is medically stable at this point -- no CP with ambulation, no arrhythmias, no dyspnea. He has promised to avoid exertional activities and to be adherent to out medical therapy. I also admonished him to return immediately for re-admission were he to have a prolonged CP episode.  With this contract in place, I feel that he is safe for discharge today with the plan to set up his cardiac catheterization as an OP procedure early next week - Mon vs. Tues. [facility] office will schedule & notify him.   Marykay Lex, M.D., M.S. [facility] [address] [place], [place]  [address]  [phone] Pager # [phone]  05/17/2012 12:36 PM

## 2012-05-17 NOTE — Progress Notes (Signed)
ANTICOAGULATION CONSULT NOTE - Follow Up  Pharmacy Consult for Heparin Indication: chest pain/ACS  No Known Allergies  Patient Measurements: Height: 5\' 6"  (167.6 cm) Weight: 162 lb (73.483 kg) IBW/kg (Calculated) : 63.8   Vital Signs: Temp: 98.4 F (36.9 C) (08/09 2322) Temp src: Oral (08/09 2322) BP: 109/58 mmHg (08/09 2320) Pulse Rate: 58  (08/09 2320)  Labs:  Basename 05/17/12 0010 05/16/12 2348 05/16/12 1656 05/16/12 1636 05/16/12 1435 05/16/12 1402  HGB -- -- -- -- 15.3 15.5  HCT -- -- -- -- 45.0 45.3  PLT -- -- -- -- -- 218  APTT -- -- -- -- -- --  LABPROT -- -- -- -- -- --  INR -- -- -- -- -- --  HEPARINUNFRC <0.10* -- -- -- -- --  CREATININE -- -- -- -- 1.10 --  CKTOTAL -- -- -- -- -- --  CKMB -- -- -- -- -- --  TROPONINI -- <0.30 <0.30 <0.30 -- --    Estimated Creatinine Clearance: 75.7 ml/min (by C-G formula based on Cr of 1.1).   Medical History: Past Medical History  Diagnosis Date  . Acid reflux   . Hypertension   . Coronary artery disease     Medications:  Plavix, Nexium, Lisinopril  Assessment: 46 y.o. male presents with chest pain. H/o CAD with DES 12/10. On IV heparin for r/o ACS.  Heparin level (<0.1) is below-goal on 900 units/hr. No problem with line / infusion per RN.   Goal of Therapy:  Heparin level 0.3-0.7 units/ml Monitor platelets by anticoagulation protocol: Yes   Plan:  1. Heparin IV bolus of 2000 units, then increase IV heparin to 1200 units/hr.  2. Heparin level in 6 hours.   Lorre Munroe, PharmD 05/17/2012,1:06 AM

## 2012-05-17 NOTE — Progress Notes (Signed)
ANTICOAGULATION CONSULT NOTE - Follow Up  Pharmacy Consult for Heparin Indication: chest pain/ACS  No Known Allergies  Patient Measurements: Height: 5\' 6"  (167.6 cm) Weight: 162 lb 7.7 oz (73.7 kg) IBW/kg (Calculated) : 63.8   Vital Signs: Temp: 97.9 F (36.6 C) (08/10 0755) Temp src: Oral (08/10 0755) BP: 110/58 mmHg (08/10 0730) Pulse Rate: 58  (08/09 2320)  Labs:  Basename 05/17/12 0804 05/17/12 0530 05/17/12 0010 05/16/12 2348 05/16/12 1656 05/16/12 1636 05/16/12 1435 05/16/12 1402  HGB -- 14.4 -- -- -- -- 15.3 --  HCT -- 42.8 -- -- -- -- 45.0 45.3  PLT -- 202 -- -- -- -- -- 218  APTT -- -- -- -- -- -- -- --  LABPROT -- 14.4 -- -- -- -- -- --  INR -- 1.10 -- -- -- -- -- --  HEPARINUNFRC 0.27* 0.29* <0.10* -- -- -- -- --  CREATININE -- 0.91 -- -- -- -- 1.10 --  CKTOTAL -- -- -- -- -- -- -- --  CKMB -- -- -- -- -- -- -- --  TROPONINI -- -- -- <0.30 <0.30 <0.30 -- --    Estimated Creatinine Clearance: 91.5 ml/min (by C-G formula based on Cr of 0.91).   Medical History: Past Medical History  Diagnosis Date  . Acid reflux   . Hypertension   . Coronary artery disease     Medications:  Prescriptions prior to admission  Medication Sig Dispense Refill  . clopidogrel (PLAVIX) 75 MG tablet Take 75 mg by mouth daily.      Marland Kitchen esomeprazole (NEXIUM) 40 MG capsule Take 40 mg by mouth daily before breakfast.      . lisinopril (PRINIVIL,ZESTRIL) 40 MG tablet Take 40 mg by mouth daily.         Admit Complaint: 46 y.o. male admitted 05/16/2012 with chest pain. H/o CAD with DES 12/10. Pharmacy consulted to dose heparin.    Assessment: Anticoagulation:  ACS: heparin infusing without problems at 1200 units/hr patient denies bleeding.    Cardiovascular: CAD, HTN: ASA, plavix, lisinopril, simvastatin: BP 100-150s/50-90s, HR 50-70s  Gastrointestinal / Nutrition: GERD: protonix  PTA Medication Issues: All Home Meds Ordered:   Best Practices: DVT Prophylaxis:  IV  heparin  Goal of Therapy:  Heparin level 0.3-0.7 units/ml Monitor platelets by anticoagulation protocol: Yes   Plan:  1. Heparin IV bolus of 2000 units, then increase IV heparin to 1400 units/hr.  2. Recheck heparin level in 6 hours.    Thank you for allowing pharmacy to be a part of this patients care team.  Lovenia Kim Pharm.D., BCPS Clinical Pharmacist 05/17/2012 10:04 AM Pager: [phone] Phone: [phone]

## 2012-05-17 NOTE — Progress Notes (Signed)
Subjective:  Some chest pain, last seconds. NTG increased.  Objective:  Vital Signs in the last 24 hours: Temp:  [97.3 F (36.3 C)-98.6 F (37 C)] 97.9 F (36.6 C) (08/10 0755) Pulse Rate:  [58-75] 58  (08/09 2320) Resp:  [10-21] 18  (08/10 0755) BP: (109-150)/(58-99) 110/58 mmHg (08/10 0730) SpO2:  [96 %-100 %] 98 % (08/10 0755) Weight:  [73.483 kg (162 lb)-73.7 kg (162 lb 7.7 oz)] 73.7 kg (162 lb 7.7 oz) (08/10 0539)  Intake/Output from previous day:  Intake/Output Summary (Last 24 hours) at 05/17/12 0936 Last data filed at 05/17/12 0900  Gross per 24 hour  Intake 962.75 ml  Output    650 ml  Net 312.75 ml    Physical Exam: General appearance: alert, cooperative, no distress and anxious Lungs: clear to auscultation bilaterally Heart: regular rate and rhythm   Rate: 50  Rhythm: sinus bradycardia  Lab Results:  Basename 05/17/12 0530 05/16/12 1435 05/16/12 1402  WBC 9.1 -- 9.9  HGB 14.4 15.3 --  PLT 202 -- 218    Basename 05/17/12 0530 05/16/12 1435  NA 139 140  K 4.3 4.1  CL 106 105  CO2 25 --  GLUCOSE 104* 159*  BUN 17 16  CREATININE 0.91 1.10    Basename 05/16/12 2348 05/16/12 1656  TROPONINI <0.30 <0.30   Hepatic Function Panel  Basename 05/17/12 0530  PROT 5.6*  ALBUMIN 3.5  AST 12  ALT 9  ALKPHOS 69  BILITOT 0.3  BILIDIR --  IBILI --   No results found for this basename: CHOL in the last 72 hours  Basename 05/17/12 0530  INR 1.10    Imaging: Dg Chest 2 View  05/16/2012  *RADIOLOGY REPORT*  Clinical Data: Chest pain.  Neck pain.  Jaw pain and headache.  CHEST - 2 VIEW  Comparison: 09/09/2009.  Findings:  Cardiopericardial silhouette within normal limits. Mediastinal contours normal. Trachea midline.  No airspace disease or effusion.  IMPRESSION: No active cardiopulmonary disease.  Original Report Authenticated By: Andreas Newport, M.D.    Cardiac Studies:  Assessment/Plan:   Principal Problem:  *Unstable angina Active Problems:  CAD (coronary artery disease), RCA/CFX DES 12/10 with residual 50-60% LAD.  HTN (hypertension)  Dyslipidemia, untreated  Smoker Plan- Cath Monday, D/C Metoprolol as his HR is 50 without it.  Corine Shelter PA-C 05/17/2012, 9:36 AM  I have seen & examined Mr. Bentsen today & reviewed his lab data.  He has ruled out for MI & the brief CP episodes have "notably improved" while on current Rx, but are still there occasionally (less frequent & severe).  His exam is normal. HR was a bit bradycardic, but would like to use BB at least in the short term -- use 12.5 mg bid.  As he has ruled out for MI, and his Sx are somewhat atypical (as was the case in the past), he is relatively low risk from an ACS standpoint.  We spent some time discussing options for plan of care -- remaining inpatient on IV medications for plan cath on Mon vs. Potential d/c home on increased Medical Rx (ASA/Plavix, Imdur & low dose BB) with plan for OP cath early next week.  He would like to be able to go home today to take care of "family concerns" today, but is determined that he would like to proceed with cath (radial access) ASAP.  I do think that he is medically stable at this point -- no CP with ambulation, no arrhythmias, no dyspnea.  He has promised to avoid exertional activities and to be adherent to out medical therapy.  I also admonished him to return immediately for re-admission were he to have a prolonged CP episode.  With this contract in place, I feel that he is safe for discharge today with the plan to set up his cardiac catheterization as an OP procedure early next week - Mon vs. Tues. [facility] office will schedule & notify him.   Marykay Lex, M.D., M.S. [facility] [address] [place], [place]  [address]  [phone] Pager # [phone]  05/17/2012 12:34 PM

## 2012-05-17 NOTE — Progress Notes (Signed)
Patient discharged to home.  Education provided re: follow up care, medications, symptoms requiring attention, and bleeding precautions post heparin infusion prior to his departure.  Patient reports no pain and vital signs within acceptable parameters at time of discharge; peripheral IV's removed.

## 2012-05-19 ENCOUNTER — Ambulatory Visit (HOSPITAL_COMMUNITY)
Admission: RE | Admit: 2012-05-19 | Discharge: 2012-05-19 | Disposition: A | Discharge status: Home / Self Care | Payer: BC Managed Care – PPO | Source: Ambulatory Visit | Attending: Cardiology | Admitting: Cardiology

## 2012-05-19 ENCOUNTER — Encounter (HOSPITAL_COMMUNITY)
Admission: RE | Disposition: A | Discharge status: Home / Self Care | Payer: Self-pay | Source: Ambulatory Visit | Attending: Cardiology

## 2012-05-19 DIAGNOSIS — I10 Essential (primary) hypertension: Secondary | ICD-10-CM | POA: Insufficient documentation

## 2012-05-19 DIAGNOSIS — I2510 Atherosclerotic heart disease of native coronary artery without angina pectoris: Secondary | ICD-10-CM | POA: Insufficient documentation

## 2012-05-19 DIAGNOSIS — Z9861 Coronary angioplasty status: Secondary | ICD-10-CM | POA: Insufficient documentation

## 2012-05-19 DIAGNOSIS — K219 Gastro-esophageal reflux disease without esophagitis: Secondary | ICD-10-CM | POA: Insufficient documentation

## 2012-05-19 DIAGNOSIS — R079 Chest pain, unspecified: Secondary | ICD-10-CM | POA: Insufficient documentation

## 2012-05-19 HISTORY — PX: LEFT HEART CATHETERIZATION WITH CORONARY ANGIOGRAM: SHX5451

## 2012-05-19 SURGERY — LEFT HEART CATHETERIZATION WITH CORONARY ANGIOGRAM
Anesthesia: LOCAL

## 2012-05-19 MED ORDER — SODIUM CHLORIDE 0.9 % IV SOLN: 1.0000 mL/kg/h | INTRAVENOUS | Status: DC

## 2012-05-19 MED ORDER — MIDAZOLAM HCL 2 MG/2ML IJ SOLN
INTRAMUSCULAR | Status: AC
  Filled 2012-05-19: qty 2

## 2012-05-19 MED ORDER — DIAZEPAM 5 MG PO TABS
ORAL_TABLET | ORAL | Status: AC
  Administered 2012-05-19: 5 mg via ORAL
  Filled 2012-05-19: qty 1

## 2012-05-19 MED ORDER — NITROGLYCERIN 0.2 MG/ML ON CALL CATH LAB
INTRAVENOUS | Status: AC
  Filled 2012-05-19: qty 1

## 2012-05-19 MED ORDER — SODIUM CHLORIDE 0.9 % IJ SOLN: 3.0000 mL | Freq: Two times a day (BID) | INTRAMUSCULAR | Status: DC

## 2012-05-19 MED ORDER — FENTANYL CITRATE 0.05 MG/ML IJ SOLN
INTRAMUSCULAR | Status: AC
  Filled 2012-05-19: qty 2

## 2012-05-19 MED ORDER — MORPHINE SULFATE 4 MG/ML IJ SOLN: 2.0000 mg | INTRAMUSCULAR | Status: DC | PRN

## 2012-05-19 MED ORDER — HEPARIN (PORCINE) IN NACL 2-0.9 UNIT/ML-% IJ SOLN
INTRAMUSCULAR | Status: AC
  Filled 2012-05-19: qty 2000

## 2012-05-19 MED ORDER — ACETAMINOPHEN 325 MG PO TABS: 650.0000 mg | ORAL_TABLET | ORAL | Status: DC | PRN

## 2012-05-19 MED ORDER — SODIUM CHLORIDE 0.9 % IV SOLN
INTRAVENOUS | Status: DC
  Administered 2012-05-19: 14:00:00 via INTRAVENOUS

## 2012-05-19 MED ORDER — LIDOCAINE HCL (PF) 1 % IJ SOLN
INTRAMUSCULAR | Status: AC
  Filled 2012-05-19: qty 30

## 2012-05-19 MED ORDER — ASPIRIN 81 MG PO CHEW
324.0000 mg | CHEWABLE_TABLET | ORAL | Status: AC
  Administered 2012-05-19: 324 mg via ORAL

## 2012-05-19 MED ORDER — NITROGLYCERIN 0.4 MG SL SUBL: 0.4000 mg | SUBLINGUAL_TABLET | SUBLINGUAL | Status: DC | PRN

## 2012-05-19 MED ORDER — SODIUM CHLORIDE 0.9 % IJ SOLN: 3.0000 mL | INTRAMUSCULAR | Status: DC | PRN

## 2012-05-19 MED ORDER — ONDANSETRON HCL 4 MG/2ML IJ SOLN: 4.0000 mg | Freq: Four times a day (QID) | INTRAMUSCULAR | Status: DC | PRN

## 2012-05-19 MED ORDER — NITROGLYCERIN 0.4 MG SL SUBL
SUBLINGUAL_TABLET | SUBLINGUAL | Status: AC
  Filled 2012-05-19: qty 25

## 2012-05-19 MED ORDER — DIAZEPAM 5 MG PO TABS
5.0000 mg | ORAL_TABLET | ORAL | Status: AC
  Administered 2012-05-19: 5 mg via ORAL

## 2012-05-19 MED ORDER — SODIUM CHLORIDE 0.9 % IV SOLN: 250.0000 mL | INTRAVENOUS | Status: DC | PRN

## 2012-05-19 MED ORDER — DIAZEPAM 5 MG PO TABS
ORAL_TABLET | ORAL | Status: AC
  Filled 2012-05-19: qty 1

## 2012-05-19 MED ORDER — ASPIRIN 81 MG PO CHEW
CHEWABLE_TABLET | ORAL | Status: AC
  Administered 2012-05-19: 324 mg via ORAL
  Filled 2012-05-19: qty 4

## 2012-05-19 NOTE — CV Procedure (Signed)
[facility] PERCUTANEOUS CORONARY INTERVENTION REPORT  NAME:  DELL BRINER   MRN: [id number] DOB:  1966/06/03   ADMIT DATE: 05/19/2012  INTERVENTIONAL CARDIOLOGIST: Marykay Lex, M.D., MS PRIMARY CARE PROVIDER: Lynne Leader, MD PRIMARY CARDIOLOGIST:   PATIENT:  Eric Savage is a 46 y.o. male with history of previous coronary disease status post PCI to the RCA and Circumflex with drug-eluting stents Dr. Tresa Endo in 2010.  He has done relatively well since his PCI until he started having chest discomfort initially with exertion and now at rest last several days that was similar to his prior anginal equivalent. He did have a nuclear stress test done prior to having his catheterization which was read as normal. There for quite skeptical of repeat nuclear stress testing as the follow catheterization demonstrated two-vessel disease.  He was initially admitted overnight for observation and ruled out for MI.  Assessment with an brought in for outpatient invasive coronary evaluation via cardiac catheterization.    PRE-OPERATIVE DIAGNOSIS:    A typical anginal pain consistent with prior unstable angina  PROCEDURES PERFORMED:    Left heart catheterization with coronary angiography and left ventriculography via 5 French Right Radial Artery access  PROCEDURE: Consent: The procedure with Risks/Benefits/Alternatives and Indications was reviewed with the patient.  All questions were answered.    Risks / Complications include, but not limited to: Death, MI, CVA/TIA, VF/VT (with defibrillation), Bradycardia (need for temporary pacer placement), contrast induced nephropathy, bleeding / bruising / hematoma / pseudoaneurysm, vascular or coronary injury (with possible emergent CT or Vascular Surgery), adverse medication reactions, infection.    The patient voice understanding and agree to proceed.    Risks of procedure as well as the alternatives and risks of each were explained  to the (patient/caregiver).  Consent for procedure obtained. Consent for signed by MD and patient with RN witness -- placed on chart.  PROCEDURE: The patient was brought to the 2nd Floor [facility] in the fasting state and prepped and draped in the usual sterile fashion for Right radial or groin access. A modified Allen's test with plethysmography was performed on the right wrist demonstrating excellent Ulnar Artery flow.  Sterile technique was used including antiseptics, cap, gloves, gown, hand hygiene, mask and sheet.  Skin prep: Chlorhexidine;  Time Out: Verified patient identification, verified procedure, site/side was marked, verified correct patient position, special equipment/implants available, medications/allergies/relevent history reviewed, required imaging and test results available.  Performed  Access: Right Radial Artery; 5 Fr Sheath, Seldinger technique Diagnostic:  Cath was advanced and removed over Versicore wire and exchanged over long exchange safety J-wire.  Left and Right Coronary Artery Angiography: TIG 4.0  LV Hemodynamics (LV Gram): Angled Pigtail TR Band:  1445 Hours,  12 mL air  Hemodynamics:  Central Aortic / Mean Pressures:  96/58 mmHg;  75 mmHg  Left Ventricular Pressures / EDP:  96/6 mmHg;  15 mmHg  Left Ventriculography:  EF:  60-65%  Wall Motion:  Normal  Coronary Anatomy:  Left Main:  Large caliber vessel that bifurcates into the circumflex and LAD. He genetically normal. LAD:  Large caliber vessel with a long proximal segment giving off one small septal perforator. There then 2 diagonal branches in the early midportion of which have proximal 40-50% tubular lesions. The remainder the LAD system has diffuse luminal irregularities and are somewhat tortuous vessels. Left Circumflex:  Large caliber vessel gives rise to a proximal small OM branch and an atrial branch. At  this point has a widely patent stent that terminates as a  circumflex courses in the interventricular groove. At this point the vessel bifurcates into the AV groove branch and OM 2  OM1:  Very small-caliber that tapers into small several small branches in the basal anterolateral wall  OM 2:  Moderate to large caliber vessel that essentially trifurcates into 3 small branches of the reach the inferolateral border.  The remainder the AV groove circumflex has an ostial 40% lesion of the vessel then terminates by bifurcating into 2 posterior lateral branches  RCA:  Large caliber dominant vessel is rises SA nodal artery at which point there is a widely patent stent extending into the mid vessel jailing of a small RV marginal branch the remainder the right coronary artery courses in the AV groove where bifurcates into the RPDA and posterior lateral system.  RPDA:  Large caliber vessel reaches almost to the apex gives several septal perforator branches as it tapers to the apex.  RPL Sysytem:The Right Posterior AV Groove Branch  gives rise to a very proximal posterior lateral branches almost like a double barrel PDA. The vessel then bifurcates distally into 2 small caliber posterior lateral branches of the most distal one giving off the AV nodal artery.  ANESTHESIA:   Local Lidocaine  2 ml SEDATION:  3 mg IV Versed,  50 mcg IV fentanyl ; Premedication:  5 mg oral Valium MEDICATIONS: Radial Cocktail: 5 mg Verapamil, 400 mcg NTG, 2 ml 2% Lidocaine in 10 ml NS   Anticoagulation:  IV Heparin 3500 units  EBL:   < 5 ml  PATIENT DISPOSITION:    The patient was transferred to the PACU holding area in a hemodynamicaly stable, chest pain free condition.  The patient tolerated the procedure well, and there were no complications.  The patient was stable before, during, and after the procedure.  POST-OPERATIVE DIAGNOSIS:    Widely patent stents in the circumflex and RCA.  Moderate lesions in both Diagonal branches in the ostium the Atrioventricular Groove Branch  of the Circumflex.  Well-preserved left ventricular ejection fraction with normal EDP.  No culprit lesions to explain the patient's chest pain as anginal in nature.  PLAN OF CARE:  Standard post-radial diagnostic catheterization care and anticipate discharge this afternoon to  Continue with aspirin and Plavix as well as beta blocker and ACE inhibitor and statin per previous discharge orders.  He'll followup [facility] likely with me as he has been Dr. Clarene Duke patient.   Marykay Lex, M.D., M.S. [facility] [address] [place], [place]  [address]  [phone]  05/19/2012 2:59 PM

## 2012-05-19 NOTE — H&P (View-Only) (Signed)
Patient ID: Eric Savage MRN: [id number], DOB/AGE: 02/10/1966   Admit date: 05/16/2012   Primary Physician: Lynne Leader, MD Primary Cardiologist: Dr Clarene Duke  HPI: 46 y/o with a history of CAD. He had an RCA/CFX DES 12/10 after a negative Nuclear study. He had residual LAD and Dx disease. He had an OP Myoview March 2012-"OK". He is seen now in the ER with complaints of two days of SSCP, "lasts 5 seconds", but not unlike his prior anginal symptoms. He did have some radiation to his jaw and throat. No associated N/V/D.   Problem List: Past Medical History  Diagnosis Date  . Acid reflux   . Hypertension   . Coronary artery disease     Past Surgical History  Procedure Date  . Angioplasty      Allergies: No Known Allergies   Home Medications  (Not in a hospital admission)   History reviewed. No pertinent family history.   History   Social History  . Marital Status: Single    Spouse Name: N/A    Number of Children: N/A  . Years of Education: N/A   Occupational History  . Not on file.   Social History Main Topics  . Smoking status: Current Everyday Smoker  . Smokeless tobacco: Not on file  . Alcohol Use: Yes  . Drug Use: 7 per week    Special: Marijuana     LSD  . Sexually Active:    Other Topics Concern  . Not on file   Social History Narrative  . No narrative on file     Review of Systems: General: negative for chills, fever, night sweats or weight changes.  Cardiovascular: negative for chest pain, dyspnea on exertion, edema, orthopnea, palpitations, paroxysmal nocturnal dyspnea or shortness of breath Dermatological: negative for rash Respiratory: negative for cough or wheezing Urologic: negative for hematuria Abdominal: negative for nausea, vomiting, diarrhea, bright red blood per rectum, melena, or hematemesis Neurologic: negative for visual changes, syncope, or dizziness All other systems reviewed and are otherwise negative except as noted  above.  Physical Exam: Blood pressure 136/91, pulse 59, temperature 98.6 F (37 C), temperature source Oral, resp. rate 16, SpO2 98.00%.  General appearance: alert, cooperative, appears stated age and no distress Neck: no carotid bruit, no JVD, supple, symmetrical, trachea midline and thyroid not enlarged, symmetric, no tenderness/mass/nodules Lungs: clear to auscultation bilaterally Heart: regular rate and rhythm Abdomen: soft, non-tender; bowel sounds normal; no masses,  no organomegaly Extremities: extremities normal, atraumatic, no cyanosis or edema Pulses: 2+ and symmetric Skin: Skin color, texture, turgor normal. No rashes or lesions Neurologic: Grossly normal    Labs:   Results for orders placed during the hospital encounter of 05/16/12 (from the past 24 hour(s))  CBC     Status: Normal   Collection Time   05/16/12  2:02 PM      Component Value Range   WBC 9.9  4.0 - 10.5 K/uL   RBC 5.02  4.22 - 5.81 MIL/uL   Hemoglobin 15.5  13.0 - 17.0 g/dL   HCT 40.9  81.1 - 91.4 %   MCV 90.2  78.0 - 100.0 fL   MCH 30.9  26.0 - 34.0 pg   MCHC 34.2  30.0 - 36.0 g/dL   RDW 78.2  95.6 - 21.3 %   Platelets 218  150 - 400 K/uL  POCT I-STAT TROPONIN I     Status: Normal   Collection Time   05/16/12  2:33 PM  Component Value Range   Troponin i, poc 0.00  0.00 - 0.08 ng/mL   Comment 3           POCT I-STAT, CHEM 8     Status: Abnormal   Collection Time   05/16/12  2:35 PM      Component Value Range   Sodium 140  135 - 145 mEq/L   Potassium 4.1  3.5 - 5.1 mEq/L   Chloride 105  96 - 112 mEq/L   BUN 16  6 - 23 mg/dL   Creatinine, Ser 7.82  0.50 - 1.35 mg/dL   Glucose, Bld 956 (*) 70 - 99 mg/dL   Calcium, Ion 2.13 (*) 1.12 - 1.23 mmol/L   TCO2 23  0 - 100 mmol/L   Hemoglobin 15.3  13.0 - 17.0 g/dL   HCT 08.6  57.8 - 46.9 %     Radiology/Studies: Dg Chest 2 View  05/16/2012  *RADIOLOGY REPORT*  Clinical Data: Chest pain.  Neck pain.  Jaw pain and headache.  CHEST - 2 VIEW   Comparison: 09/09/2009.  Findings:  Cardiopericardial silhouette within normal limits. Mediastinal contours normal. Trachea midline.  No airspace disease or effusion.  IMPRESSION: No active cardiopulmonary disease.  Original Report Authenticated By: Eric Savage, M.D.    EKG:NSR without acute changes   ASSESSMENT AND PLAN:  Principal Problem:  *Unstable angina Active Problems:  CAD (coronary artery disease), RCA/CFX DES 12/10 with residual 50-60% LAD.  HTN (hypertension)  Dyslipidemia, untreated  Smoker  Hyperglycemia- BS 159  Plan- Admit, Heparin, NTG, statin, ASA, low dose Metoprolol, R/O MI  Will check Hgb A1c.  Eric Pretty, PA-C 05/16/2012, 4:24 PM  I have seen & examined the patient along with Mr. Eric Savage.  I agree with his findings, exam & recommendations.  Essentially a 46 y/o man with h/o of known CAD - PCI to LCx & RCA in 2010 after "false negative Myoivew" ST.  Has been in USOH until the past few weeks when he began to note intermittent Chest pains -- always with exertion.  Over the past few days, he has noted them with increased frequency & lasting longer.  Today, the Sx have started to be present @ rest, although always short-lived.  Says he has had a "few" since coming to the ER. He states that these Sx are very similar to his previous chest pains prior to his PCI.    He denies and CHF type Sx of edema, PND, orthopnea.  No palpitations, syncope or near-syncope.  He notes that he ran out of his Plavix Rx "a while ago."  I discussed with him the potential plan for admit - r/o MI, NTG  & Heparin.  Smoking cessation counseling.  As his Sx seem to be progressively worsening in nature and similar to his previous Anginal equivalents Sx, I agree that this should be considered Unstable Angina until proven otherwise.   He has had a false negative Myoview before with CP and a negative once over a year ago without CP.  He is quite skeptical of Myoview results, although with  multivessel disease, the likely reason for false negative reading would be balanced ischemia, which is less likely an issue after 2 vessel revascularization.  He is more in favor of invasive evaluation "to be sure", which is a reasonable approach for definitive anatomic evaluation.  We will admit with plan for [facility] +/- PCI on Monday for Unstable Angina.  Marykay Lex, M.D., M.S. [facility] [address] [place], [place]  [address]  [phone] Pager # [phone]  05/16/2012 5:26 PM

## 2012-05-19 NOTE — Interval H&P Note (Signed)
History and Physical Interval Note:  05/19/2012 1:15 PM  Eric Savage was admitted overnight 8/9-08/07/2012 for signs and is concerning for unstable angina. He ruled out for MI with cardiac markers and his pain was relatively well-controlled on nitroglycerin. Due to pressing family emergency we decided that he would be stable for discharge provided plan to return earlier this week for planned cardiac catheterization.  She seems much less stressed now and issues resolved. He has still had intermittent chest discomfort, albeit less than it was prior to his admission.   He has presented today for planned cardiac catheterization, with the diagnosis of Chest pain /Unstable Angina As Is His Symptoms Similar to the Chest Discomfort He Had Prior to 2 Stents Placed in the Past. The various methods of treatment have been discussed with the patient and family. After consideration of risks, benefits and other options for treatment, the patient has consented to  Procedure(s) (LRB): LEFT HEART CATHETERIZATION WITH CORONARY ANGIOGRAM (N/A) with possible percutaneous intervention as an invasive evaluation procedure.    The patient's history has been reviewed, patient examined, no change in status, stable for surgery.  I have reviewed the patient's chart and labs.  Questions were answered to the patient's satisfaction.    Plan is to proceed with left heart catheterization coronary angiography possible PCI.  Marykay Lex, M.D., M.S. [facility] [address] [place], [place]  [address]  [phone] Pager # [phone]  05/19/2012 1:18 PM

## 2012-05-19 NOTE — Progress Notes (Signed)
Utilization Review Completed.Khoen Genet T8/09/2012   

## 2013-01-06 DIAGNOSIS — R079 Chest pain, unspecified: Secondary | ICD-10-CM

## 2013-01-06 HISTORY — DX: Chest pain, unspecified: R07.9

## 2013-01-27 ENCOUNTER — Encounter (HOSPITAL_COMMUNITY): Payer: Self-pay | Admitting: Emergency Medicine

## 2013-01-27 ENCOUNTER — Emergency Department (HOSPITAL_COMMUNITY): Payer: Self-pay

## 2013-01-27 DIAGNOSIS — I2510 Atherosclerotic heart disease of native coronary artery without angina pectoris: Principal | ICD-10-CM | POA: Insufficient documentation

## 2013-01-27 DIAGNOSIS — R0602 Shortness of breath: Secondary | ICD-10-CM | POA: Insufficient documentation

## 2013-01-27 DIAGNOSIS — I200 Unstable angina: Secondary | ICD-10-CM | POA: Insufficient documentation

## 2013-01-27 DIAGNOSIS — F17200 Nicotine dependence, unspecified, uncomplicated: Secondary | ICD-10-CM | POA: Insufficient documentation

## 2013-01-27 DIAGNOSIS — E785 Hyperlipidemia, unspecified: Secondary | ICD-10-CM | POA: Insufficient documentation

## 2013-01-27 DIAGNOSIS — Z9861 Coronary angioplasty status: Secondary | ICD-10-CM | POA: Insufficient documentation

## 2013-01-27 DIAGNOSIS — Z91199 Patient's noncompliance with other medical treatment and regimen due to unspecified reason: Secondary | ICD-10-CM | POA: Insufficient documentation

## 2013-01-27 DIAGNOSIS — R079 Chest pain, unspecified: Secondary | ICD-10-CM | POA: Insufficient documentation

## 2013-01-27 DIAGNOSIS — I498 Other specified cardiac arrhythmias: Secondary | ICD-10-CM | POA: Insufficient documentation

## 2013-01-27 DIAGNOSIS — Z9119 Patient's noncompliance with other medical treatment and regimen: Secondary | ICD-10-CM | POA: Insufficient documentation

## 2013-01-27 DIAGNOSIS — I10 Essential (primary) hypertension: Secondary | ICD-10-CM | POA: Insufficient documentation

## 2013-01-27 LAB — COMPREHENSIVE METABOLIC PANEL
ALT: 14 U/L (ref 0–53)
AST: 17 U/L (ref 0–37)
Albumin: 3.7 g/dL (ref 3.5–5.2)
Alkaline Phosphatase: 86 U/L (ref 39–117)
BUN: 16 mg/dL (ref 6–23)
CO2: 26 mEq/L (ref 19–32)
Calcium: 9.5 mg/dL (ref 8.4–10.5)
Chloride: 104 mEq/L (ref 96–112)
Creatinine, Ser: 1.14 mg/dL (ref 0.50–1.35)
GFR calc Af Amer: 87 mL/min — ABNORMAL LOW (ref >90)
GFR calc non Af Amer: 75 mL/min — ABNORMAL LOW (ref >90)
Glucose, Bld: 167 mg/dL — ABNORMAL HIGH (ref 70–99)
Potassium: 4 mEq/L (ref 3.5–5.1)
Sodium: 141 mEq/L (ref 135–145)
Total Bilirubin: 0.4 mg/dL (ref 0.3–1.2)
Total Protein: 6.4 g/dL (ref 6.0–8.3)

## 2013-01-27 LAB — POCT I-STAT TROPONIN I: Troponin i, poc: 0 ng/mL (ref 0.00–0.08)

## 2013-01-27 LAB — CBC WITH DIFFERENTIAL/PLATELET
Basophils Absolute: 0 10*3/uL (ref 0.0–0.1)
Basophils Relative: 0 % (ref 0–1)
Eosinophils Absolute: 0.3 10*3/uL (ref 0.0–0.7)
Eosinophils Relative: 4 % (ref 0–5)
HCT: 42.9 % (ref 39.0–52.0)
Hemoglobin: 15.5 g/dL (ref 13.0–17.0)
Lymphocytes Relative: 28 % (ref 12–46)
Lymphs Abs: 2.1 10*3/uL (ref 0.7–4.0)
MCH: 31.8 pg (ref 26.0–34.0)
MCHC: 36.1 g/dL — ABNORMAL HIGH (ref 30.0–36.0)
MCV: 88.1 fL (ref 78.0–100.0)
Monocytes Absolute: 0.6 10*3/uL (ref 0.1–1.0)
Monocytes Relative: 8 % (ref 3–12)
Neutro Abs: 4.5 10*3/uL (ref 1.7–7.7)
Neutrophils Relative %: 60 % (ref 43–77)
Platelets: 206 10*3/uL (ref 150–400)
RBC: 4.87 MIL/uL (ref 4.22–5.81)
RDW: 12.2 % (ref 11.5–15.5)
WBC: 7.5 10*3/uL (ref 4.0–10.5)

## 2013-01-27 NOTE — ED Notes (Signed)
PT. REPORTS INTERMITTENT MID CHEST PAIN WITH SOB FOR 2 - 3 WEEKS , DENIES NAUSEA OR DIAPHORESIS . STATES HISTORY OF CAD/STENTS.

## 2013-01-28 ENCOUNTER — Encounter (HOSPITAL_COMMUNITY)
Admission: EM | Disposition: A | Discharge status: Home / Self Care | Payer: Self-pay | Source: Home / Self Care | Attending: Emergency Medicine

## 2013-01-28 ENCOUNTER — Observation Stay (HOSPITAL_COMMUNITY)
Admission: EM | Admit: 2013-01-28 | Discharge: 2013-01-28 | Disposition: A | Discharge status: Home / Self Care | Payer: Self-pay | Attending: Cardiovascular Disease | Admitting: Cardiovascular Disease

## 2013-01-28 ENCOUNTER — Encounter (HOSPITAL_COMMUNITY): Payer: Self-pay | Admitting: Internal Medicine

## 2013-01-28 DIAGNOSIS — E785 Hyperlipidemia, unspecified: Secondary | ICD-10-CM | POA: Diagnosis present

## 2013-01-28 DIAGNOSIS — Z9119 Patient's noncompliance with other medical treatment and regimen: Secondary | ICD-10-CM

## 2013-01-28 DIAGNOSIS — I10 Essential (primary) hypertension: Secondary | ICD-10-CM | POA: Diagnosis present

## 2013-01-28 DIAGNOSIS — Z91199 Patient's noncompliance with other medical treatment and regimen due to unspecified reason: Secondary | ICD-10-CM

## 2013-01-28 DIAGNOSIS — R079 Chest pain, unspecified: Secondary | ICD-10-CM

## 2013-01-28 DIAGNOSIS — I2510 Atherosclerotic heart disease of native coronary artery without angina pectoris: Secondary | ICD-10-CM | POA: Diagnosis present

## 2013-01-28 DIAGNOSIS — F17200 Nicotine dependence, unspecified, uncomplicated: Secondary | ICD-10-CM | POA: Diagnosis present

## 2013-01-28 DIAGNOSIS — Z9861 Coronary angioplasty status: Secondary | ICD-10-CM | POA: Diagnosis present

## 2013-01-28 HISTORY — DX: Chest pain, unspecified: R07.9

## 2013-01-28 HISTORY — DX: Pure hypercholesterolemia, unspecified: E78.00

## 2013-01-28 HISTORY — PX: LEFT HEART CATHETERIZATION WITH CORONARY ANGIOGRAM: SHX5451

## 2013-01-28 LAB — BASIC METABOLIC PANEL
BUN: 16 mg/dL (ref 6–23)
CO2: 28 mEq/L (ref 19–32)
Calcium: 9.3 mg/dL (ref 8.4–10.5)
Chloride: 103 mEq/L (ref 96–112)
Creatinine, Ser: 0.99 mg/dL (ref 0.50–1.35)
GFR calc Af Amer: >90 mL/min (ref >90)
GFR calc non Af Amer: >90 mL/min (ref >90)
Glucose, Bld: 111 mg/dL — ABNORMAL HIGH (ref 70–99)
Potassium: 4.1 mEq/L (ref 3.5–5.1)
Sodium: 139 mEq/L (ref 135–145)

## 2013-01-28 LAB — CBC
HCT: 41.6 % (ref 39.0–52.0)
HCT: 40.6 % (ref 39.0–52.0)
Hemoglobin: 14.8 g/dL (ref 13.0–17.0)
Hemoglobin: 14.5 g/dL (ref 13.0–17.0)
MCH: 31 pg (ref 26.0–34.0)
MCH: 31 pg (ref 26.0–34.0)
MCHC: 35.6 g/dL (ref 30.0–36.0)
MCHC: 35.7 g/dL (ref 30.0–36.0)
MCV: 87.2 fL (ref 78.0–100.0)
MCV: 86.9 fL (ref 78.0–100.0)
Platelets: 187 10*3/uL (ref 150–400)
Platelets: 203 10*3/uL (ref 150–400)
RBC: 4.77 MIL/uL (ref 4.22–5.81)
RBC: 4.67 MIL/uL (ref 4.22–5.81)
RDW: 12.2 % (ref 11.5–15.5)
RDW: 12.3 % (ref 11.5–15.5)
WBC: 7.5 10*3/uL (ref 4.0–10.5)
WBC: 7.4 10*3/uL (ref 4.0–10.5)

## 2013-01-28 LAB — CREATININE, SERUM
Creatinine, Ser: 1.11 mg/dL (ref 0.50–1.35)
GFR calc Af Amer: 90 mL/min — ABNORMAL LOW (ref >90)
GFR calc non Af Amer: 77 mL/min — ABNORMAL LOW (ref >90)

## 2013-01-28 LAB — LIPID PANEL
Cholesterol: 178 mg/dL (ref 0–200)
HDL: 38 mg/dL — ABNORMAL LOW (ref >39)
LDL Cholesterol: 91 mg/dL (ref 0–99)
Total CHOL/HDL Ratio: 4.7 RATIO
Triglycerides: 243 mg/dL — ABNORMAL HIGH (ref <150)
VLDL: 49 mg/dL — ABNORMAL HIGH (ref 0–40)

## 2013-01-28 LAB — HEMOGLOBIN A1C
Hgb A1c MFr Bld: 5.2 % (ref <5.7)
Mean Plasma Glucose: 103 mg/dL (ref <117)

## 2013-01-28 LAB — POCT ACTIVATED CLOTTING TIME: Activated Clotting Time: 100 seconds

## 2013-01-28 LAB — TROPONIN I
Troponin I: <0.3 ng/mL (ref <0.30)
Troponin I: <0.3 ng/mL (ref <0.30)
Troponin I: <0.3 ng/mL (ref <0.30)

## 2013-01-28 LAB — PROTIME-INR
INR: 0.97 (ref 0.00–1.49)
Prothrombin Time: 12.8 seconds (ref 11.6–15.2)

## 2013-01-28 LAB — MRSA PCR SCREENING: MRSA by PCR: NEGATIVE

## 2013-01-28 LAB — GLUCOSE, CAPILLARY: Glucose-Capillary: 114 mg/dL — ABNORMAL HIGH (ref 70–99)

## 2013-01-28 LAB — HEPARIN LEVEL (UNFRACTIONATED): Heparin Unfractionated: 0.32 IU/mL (ref 0.30–0.70)

## 2013-01-28 SURGERY — LEFT HEART CATHETERIZATION WITH CORONARY ANGIOGRAM
Anesthesia: LOCAL

## 2013-01-28 MED ORDER — ASPIRIN EC 325 MG PO TBEC: 325.0000 mg | DELAYED_RELEASE_TABLET | Freq: Every day | ORAL | Status: DC

## 2013-01-28 MED ORDER — FENTANYL CITRATE 0.05 MG/ML IJ SOLN
INTRAMUSCULAR | Status: AC
  Filled 2013-01-28: qty 2

## 2013-01-28 MED ORDER — ACETAMINOPHEN 325 MG PO TABS: 650.0000 mg | ORAL_TABLET | Freq: Four times a day (QID) | ORAL | Status: DC | PRN

## 2013-01-28 MED ORDER — NITROGLYCERIN 0.4 MG SL SUBL: 0.4000 mg | SUBLINGUAL_TABLET | SUBLINGUAL | Status: DC | PRN

## 2013-01-28 MED ORDER — ONDANSETRON HCL 4 MG/2ML IJ SOLN: 4.0000 mg | Freq: Four times a day (QID) | INTRAMUSCULAR | Status: DC | PRN

## 2013-01-28 MED ORDER — ACETAMINOPHEN 325 MG PO TABS: 650.0000 mg | ORAL_TABLET | ORAL | Status: DC | PRN

## 2013-01-28 MED ORDER — DIAZEPAM 5 MG PO TABS: 5.0000 mg | ORAL_TABLET | ORAL | Status: DC

## 2013-01-28 MED ORDER — ZOLPIDEM TARTRATE 5 MG PO TABS: 5.0000 mg | ORAL_TABLET | Freq: Every evening | ORAL | Status: DC | PRN

## 2013-01-28 MED ORDER — ISOSORBIDE MONONITRATE ER 30 MG PO TB24: 15.0000 mg | ORAL_TABLET | Freq: Every day | ORAL | Status: DC

## 2013-01-28 MED ORDER — MIDAZOLAM HCL 2 MG/2ML IJ SOLN
INTRAMUSCULAR | Status: AC
  Filled 2013-01-28: qty 2

## 2013-01-28 MED ORDER — TRAMADOL HCL 50 MG PO TABS
50.0000 mg | ORAL_TABLET | Freq: Four times a day (QID) | ORAL | Status: DC | PRN
  Filled 2013-01-28 (×3): qty 1

## 2013-01-28 MED ORDER — HEPARIN (PORCINE) IN NACL 100-0.45 UNIT/ML-% IJ SOLN
1000.0000 [IU]/h | INTRAMUSCULAR | Status: DC
  Administered 2013-01-28: 1000 [IU]/h via INTRAVENOUS
  Filled 2013-01-28: qty 250

## 2013-01-28 MED ORDER — HEPARIN (PORCINE) IN NACL 2-0.9 UNIT/ML-% IJ SOLN
INTRAMUSCULAR | Status: AC
  Filled 2013-01-28: qty 1000

## 2013-01-28 MED ORDER — ALUM & MAG HYDROXIDE-SIMETH 200-200-20 MG/5ML PO SUSP
30.0000 mL | Freq: Once | ORAL | Status: AC
  Administered 2013-01-28: 30 mL via ORAL
  Filled 2013-01-28: qty 30

## 2013-01-28 MED ORDER — PANTOPRAZOLE SODIUM 40 MG PO TBEC
40.0000 mg | DELAYED_RELEASE_TABLET | Freq: Every day | ORAL | Status: DC
  Administered 2013-01-28: 40 mg via ORAL
  Filled 2013-01-28: qty 1

## 2013-01-28 MED ORDER — SODIUM CHLORIDE 0.9 % IV SOLN
INTRAVENOUS | Status: DC
  Administered 2013-01-28: 10:00:00 via INTRAVENOUS

## 2013-01-28 MED ORDER — ALPRAZOLAM 0.25 MG PO TABS: 0.2500 mg | ORAL_TABLET | Freq: Three times a day (TID) | ORAL | Status: DC | PRN

## 2013-01-28 MED ORDER — HEPARIN BOLUS VIA INFUSION
3500.0000 [IU] | Freq: Once | INTRAVENOUS | Status: AC
  Administered 2013-01-28: 3500 [IU] via INTRAVENOUS

## 2013-01-28 MED ORDER — ASPIRIN EC 325 MG PO TBEC
325.0000 mg | DELAYED_RELEASE_TABLET | Freq: Every day | ORAL | Status: DC
  Filled 2013-01-28: qty 1

## 2013-01-28 MED ORDER — SODIUM CHLORIDE 0.9 % IV SOLN
INTRAVENOUS | Status: DC
Start: 2013-01-28 — End: 2013-01-28

## 2013-01-28 MED ORDER — LIDOCAINE HCL (PF) 1 % IJ SOLN
INTRAMUSCULAR | Status: AC
  Filled 2013-01-28: qty 30

## 2013-01-28 MED ORDER — ONDANSETRON HCL 4 MG PO TABS: 4.0000 mg | ORAL_TABLET | Freq: Four times a day (QID) | ORAL | Status: DC | PRN

## 2013-01-28 MED ORDER — SODIUM CHLORIDE 0.9 % IJ SOLN: 3.0000 mL | Freq: Two times a day (BID) | INTRAMUSCULAR | Status: DC

## 2013-01-28 MED ORDER — SIMVASTATIN 20 MG PO TABS: 20.0000 mg | ORAL_TABLET | Freq: Every evening | ORAL | Status: DC

## 2013-01-28 MED ORDER — ASPIRIN EC 325 MG PO TBEC
325.0000 mg | DELAYED_RELEASE_TABLET | Freq: Every day | ORAL | Status: DC
  Administered 2013-01-28: 325 mg via ORAL
  Filled 2013-01-28: qty 1

## 2013-01-28 MED ORDER — ENOXAPARIN SODIUM 40 MG/0.4ML ~~LOC~~ SOLN
40.0000 mg | SUBCUTANEOUS | Status: DC
Start: 2013-01-28 — End: 2013-01-28

## 2013-01-28 MED ORDER — ACETAMINOPHEN 650 MG RE SUPP: 650.0000 mg | Freq: Four times a day (QID) | RECTAL | Status: DC | PRN

## 2013-01-28 MED ORDER — SODIUM CHLORIDE 0.9 % IV SOLN
INTRAVENOUS | Status: DC
  Administered 2013-01-28: 04:00:00 via INTRAVENOUS

## 2013-01-28 MED ORDER — ASPIRIN 81 MG PO CHEW
324.0000 mg | CHEWABLE_TABLET | ORAL | Status: AC
  Administered 2013-01-28: 324 mg via ORAL
  Filled 2013-01-28: qty 4

## 2013-01-28 MED ORDER — DIAZEPAM 5 MG PO TABS
5.0000 mg | ORAL_TABLET | ORAL | Status: AC
  Administered 2013-01-28: 5 mg via ORAL
  Filled 2013-01-28: qty 1

## 2013-01-28 MED ORDER — ATORVASTATIN CALCIUM 40 MG PO TABS
40.0000 mg | ORAL_TABLET | Freq: Every day | ORAL | Status: DC
  Filled 2013-01-28: qty 1

## 2013-01-28 MED ORDER — NITROGLYCERIN 0.4 MG/HR TD PT24
0.4000 mg | MEDICATED_PATCH | Freq: Every day | TRANSDERMAL | Status: DC
  Administered 2013-01-28 (×2): 0.4 mg via TRANSDERMAL
  Filled 2013-01-28 (×3): qty 1

## 2013-01-28 NOTE — ED Notes (Signed)
Pt requesting medication for his acid reflux, this RN spoke with Dr. Toniann Fail in regards to pt's request, new orders are being placed by Dr. Toniann Fail for step down, Heparin IV drip, and Nitro paste. Bed control informed for new bed placement

## 2013-01-28 NOTE — Care Management Note (Signed)
    Page 1 of 1   01/28/2013     8:56:53 AM   CARE MANAGEMENT NOTE 01/28/2013  Patient:  Eric Savage, Eric Savage   Account Number:  [id number]  Date Initiated:  01/28/2013  Documentation initiated by:  Junius Creamer  Subjective/Objective Assessment:   adm w ch pain     Action/Plan:   lives alone, no ins listed   Anticipated DC Date:     Anticipated DC Plan:  HOME/SELF CARE      DC Planning Services  CM consult      Choice offered to / List presented to:             Status of service:   Medicare Important Message given?   (If response is "NO", the following Medicare IM given date fields will be blank) Date Medicare IM given:   Date Additional Medicare IM given:    Discharge Disposition:    Per UR Regulation:  Reviewed for med. necessity/level of care/duration of stay  If discussed at Long Length of Stay Meetings, dates discussed:    Comments:  4/23 0856 debbie Liesel Peckenpaugh rn,bsn

## 2013-01-28 NOTE — ED Provider Notes (Signed)
History     CSN: [id number]  Arrival date & time 01/27/13  2243   First MD Initiated Contact with Patient 01/28/13 0202      Chief Complaint  Patient presents with  . Chest Pain    (Consider location/radiation/quality/duration/timing/severity/associated sxs/prior treatment) HPI Hx per PT - having CP dull ache substernal with SOB, worse over the last few days to weeks. No diaphoresis or nausea, was followed by [facility]. Last cath 2 years ago has h/o stents, currently smokes tob and takes only ASA. Currently pain free. Has some reflux but states this is different. Pain mod in severity and not radiating. No known aggrevating/ alleviating factors Past Medical History  Diagnosis Date  . Acid reflux   . Hypertension   . Coronary artery disease     Past Surgical History  Procedure Laterality Date  . Angioplasty    . Coronary stent placement      No family history on file.  History  Substance Use Topics  . Smoking status: Current Every Day Smoker  . Smokeless tobacco: Not on file  . Alcohol Use: Yes      Review of Systems  Constitutional: Negative for fever and chills.  HENT: Negative for neck pain and neck stiffness.   Eyes: Negative for pain.  Respiratory: Positive for shortness of breath.   Cardiovascular: Positive for chest pain.  Gastrointestinal: Negative for abdominal pain.  Genitourinary: Negative for dysuria.  Musculoskeletal: Negative for back pain.  Skin: Negative for rash.  Neurological: Negative for headaches.  All other systems reviewed and are negative.    Allergies  Review of patient's allergies indicates no known allergies.  Home Medications   Current Outpatient Rx  Name  Route  Sig  Dispense  Refill  . aspirin 325 MG tablet   Oral   Take 1 tablet (325 mg total) by mouth daily.         . nitroGLYCERIN (NITROSTAT) 0.4 MG SL tablet   Sublingual   Place 1 tablet (0.4 mg total) under the tongue every 5 (five) minutes as needed for chest pain.  25 tablet   2   . omeprazole (PRILOSEC OTC) 20 MG tablet   Oral   Take 20 mg by mouth daily.           BP 143/88  Pulse 67  Temp(Src) 98.4 F (36.9 C) (Oral)  Resp 16  SpO2 100%  Physical Exam  Constitutional: He is oriented to person, place, and time. He appears well-developed and well-nourished.  HENT:  Head: Normocephalic and atraumatic.  Eyes: Conjunctivae and EOM are normal. Pupils are equal, round, and reactive to light.  Neck: Trachea normal. Neck supple. No thyromegaly present.  Cardiovascular: Normal rate, regular rhythm, S1 normal, S2 normal and normal pulses.     No systolic murmur is present   No diastolic murmur is present  Pulses:      Radial pulses are 2+ on the right side, and 2+ on the left side.  Pulmonary/Chest: Effort normal and breath sounds normal. He has no wheezes. He has no rhonchi. He has no rales. He exhibits no tenderness.  Abdominal: Soft. Normal appearance and bowel sounds are normal. There is no tenderness. There is no CVA tenderness and negative Murphy's sign.  Musculoskeletal:  calves nontender, no cords or erythema  Neurological: He is alert and oriented to person, place, and time. He has normal strength. No cranial nerve deficit or sensory deficit. GCS eye subscore is 4. GCS verbal subscore is 5.  GCS motor subscore is 6.  Skin: Skin is warm and dry. No rash noted. He is not diaphoretic.  Psychiatric: His speech is normal.  Cooperative and appropriate    ED Course  Procedures (including critical care time)  Results for orders placed during the hospital encounter of 01/28/13  CBC WITH DIFFERENTIAL      Result Value Range   WBC 7.5  4.0 - 10.5 K/uL   RBC 4.87  4.22 - 5.81 MIL/uL   Hemoglobin 15.5  13.0 - 17.0 g/dL   HCT 16.1  09.6 - 04.5 %   MCV 88.1  78.0 - 100.0 fL   MCH 31.8  26.0 - 34.0 pg   MCHC 36.1 (*) 30.0 - 36.0 g/dL   RDW 40.9  81.1 - 91.4 %   Platelets 206  150 - 400 K/uL   Neutrophils Relative 60  43 - 77 %   Neutro  Abs 4.5  1.7 - 7.7 K/uL   Lymphocytes Relative 28  12 - 46 %   Lymphs Abs 2.1  0.7 - 4.0 K/uL   Monocytes Relative 8  3 - 12 %   Monocytes Absolute 0.6  0.1 - 1.0 K/uL   Eosinophils Relative 4  0 - 5 %   Eosinophils Absolute 0.3  0.0 - 0.7 K/uL   Basophils Relative 0  0 - 1 %   Basophils Absolute 0.0  0.0 - 0.1 K/uL  COMPREHENSIVE METABOLIC PANEL      Result Value Range   Sodium 141  135 - 145 mEq/L   Potassium 4.0  3.5 - 5.1 mEq/L   Chloride 104  96 - 112 mEq/L   CO2 26  19 - 32 mEq/L   Glucose, Bld 167 (*) 70 - 99 mg/dL   BUN 16  6 - 23 mg/dL   Creatinine, Ser 7.82  0.50 - 1.35 mg/dL   Calcium 9.5  8.4 - 95.6 mg/dL   Total Protein 6.4  6.0 - 8.3 g/dL   Albumin 3.7  3.5 - 5.2 g/dL   AST 17  0 - 37 U/L   ALT 14  0 - 53 U/L   Alkaline Phosphatase 86  39 - 117 U/L   Total Bilirubin 0.4  0.3 - 1.2 mg/dL   GFR calc non Af Amer 75 (*) >90 mL/min   GFR calc Af Amer 87 (*) >90 mL/min  POCT I-STAT TROPONIN I      Result Value Range   Troponin i, poc 0.00  0.00 - 0.08 ng/mL   Comment 3            Dg Chest 2 View  01/28/2013  *RADIOLOGY REPORT*  Clinical Data: Chest pain  CHEST - 2 VIEW  Comparison: 05/16/2012  Findings: Heart size upper normal.  Mediastinal contours otherwise within normal range.  No focal consolidation, pleural effusion, or pneumothorax.  No acute osseous finding.  IMPRESSION: No radiographic evidence of acute cardiopulmonary process.   Original Report Authenticated By: Jearld Lesch, M.D.       Date: 01/28/2013  Rate: 70  Rhythm: normal sinus rhythm  QRS Axis: normal  Intervals: normal  ST/T Wave abnormalities: nonspecific ST changes  Conduction Disutrbances:none  Narrative Interpretation:   Old EKG Reviewed: unchanged  2:25 AM ASA at home today. Pain free in ER, d/w MED Dr Toniann Fail to admit for [facility]   MDM  CP/ SOB with sig h/o CAD  ECG, CXR, labs  MED admit  Sunnie Nielsen, MD 01/28/13 [phone]

## 2013-01-28 NOTE — Discharge Summary (Signed)
Patient ID: Eric Savage,  MRN: [id number], DOB/AGE: [birth year] 47 y.o.  Admit date: 01/28/2013 Discharge date: 01/28/2013  Primary Care Provider:  Primary Cardiologist: Dr Tresa Endo  Discharge Diagnoses Principal Problem:   Unstable angina Active Problems:   CAD, RCA/CFX DES 12/10 with residual 50-60% LAD.- Cath 05/19/12- and 01/28/13 no ISR- med Rx   Non-compliance with treatment   HTN (hypertension)   Dyslipidemia, untreated   Smoker    Procedures: Coronary angiogram 01/28/13   Hospital Course:  This is a 47 y.o. male with a past medical history significant for CAD. He had an RCA and CFX DES in Dec 2010 after a negative Myoview. He was admitted in Aug 2013 with chest pain and ruled out for an MI. He came in for an OP cath a few days later that revealed patent stents and moderate residual LAD and Dx disease. He was admitted with [place] 01/27/13. He had run out of all his medications several weeks ago. He had recently lost his job. Troponin's were negative. Dr Tresa Endo felt that the best way to evaluate him further would be with coronary angiogram. This was done 01/28/13 and revealed no evidence for restenosis of either RCA or LCX. There was mild LAD 20% narrowing and 60 - 70% smooth mid diagonal 2 stenosis. He will be discharged later on the 23d. He is not on a beta blocker because of baseline bradycardia. Dr Tresa Endo feels it is OK for him to be off Plavix.at this point. He has been counseled against smoking. He has an appointment for follow up in two weeks.    Discharge Vitals:  Blood pressure 113/68, pulse 55, temperature 97.7 F (36.5 C), temperature source Oral, resp. rate 18, SpO2 96.00%.    Labs: Results for orders placed during the hospital encounter of 01/28/13 (from the past 48 hour(s))  CBC WITH DIFFERENTIAL     Status: Abnormal   Collection Time    01/27/13 10:55 PM      Result Value Range   WBC 7.5  4.0 - 10.5 K/uL   RBC 4.87  4.22 - 5.81 MIL/uL   Hemoglobin 15.5  13.0 - 17.0  g/dL   HCT 82.9  56.2 - 13.0 %   MCV 88.1  78.0 - 100.0 fL   MCH 31.8  26.0 - 34.0 pg   MCHC 36.1 (*) 30.0 - 36.0 g/dL   RDW 86.5  78.4 - 69.6 %   Platelets 206  150 - 400 K/uL   Neutrophils Relative 60  43 - 77 %   Neutro Abs 4.5  1.7 - 7.7 K/uL   Lymphocytes Relative 28  12 - 46 %   Lymphs Abs 2.1  0.7 - 4.0 K/uL   Monocytes Relative 8  3 - 12 %   Monocytes Absolute 0.6  0.1 - 1.0 K/uL   Eosinophils Relative 4  0 - 5 %   Eosinophils Absolute 0.3  0.0 - 0.7 K/uL   Basophils Relative 0  0 - 1 %   Basophils Absolute 0.0  0.0 - 0.1 K/uL  COMPREHENSIVE METABOLIC PANEL     Status: Abnormal   Collection Time    01/27/13 10:55 PM      Result Value Range   Sodium 141  135 - 145 mEq/L   Potassium 4.0  3.5 - 5.1 mEq/L   Chloride 104  96 - 112 mEq/L   CO2 26  19 - 32 mEq/L   Glucose, Bld 167 (*) 70 - 99 mg/dL  BUN 16  6 - 23 mg/dL   Creatinine, Ser 8.41  0.50 - 1.35 mg/dL   Calcium 9.5  8.4 - 32.4 mg/dL   Total Protein 6.4  6.0 - 8.3 g/dL   Albumin 3.7  3.5 - 5.2 g/dL   AST 17  0 - 37 U/L   ALT 14  0 - 53 U/L   Alkaline Phosphatase 86  39 - 117 U/L   Total Bilirubin 0.4  0.3 - 1.2 mg/dL   GFR calc non Af Amer 75 (*) >90 mL/min   GFR calc Af Amer 87 (*) >90 mL/min   Comment:            The eGFR has been calculated     using the CKD EPI equation.     This calculation has not been     validated in all clinical     situations.     eGFR's persistently     <90 mL/min signify     possible Chronic Kidney Disease.  POCT I-STAT TROPONIN I     Status: None   Collection Time    01/27/13 11:08 PM      Result Value Range   Troponin i, poc 0.00  0.00 - 0.08 ng/mL   Comment 3            Comment: Due to the release kinetics of cTnI,     a negative result within the first hours     of the onset of symptoms does not rule out     myocardial infarction with certainty.     If myocardial infarction is still suspected,     repeat the test at appropriate intervals.  TROPONIN I     Status:  None   Collection Time    01/28/13  3:29 AM      Result Value Range   Troponin I <0.30  <0.30 ng/mL   Comment:            Due to the release kinetics of cTnI,     a negative result within the first hours     of the onset of symptoms does not rule out     myocardial infarction with certainty.     If myocardial infarction is still suspected,     repeat the test at appropriate intervals.  CREATININE, SERUM     Status: Abnormal   Collection Time    01/28/13  3:29 AM      Result Value Range   Creatinine, Ser 1.11  0.50 - 1.35 mg/dL   GFR calc non Af Amer 77 (*) >90 mL/min   GFR calc Af Amer 90 (*) >90 mL/min   Comment:            The eGFR has been calculated     using the CKD EPI equation.     This calculation has not been     validated in all clinical     situations.     eGFR's persistently     <90 mL/min signify     possible Chronic Kidney Disease.  CBC     Status: None   Collection Time    01/28/13  4:35 AM      Result Value Range   WBC 7.5  4.0 - 10.5 K/uL   RBC 4.77  4.22 - 5.81 MIL/uL   Hemoglobin 14.8  13.0 - 17.0 g/dL   HCT 40.1  02.7 - 25.3 %   MCV 87.2  78.0 -  100.0 fL   MCH 31.0  26.0 - 34.0 pg   MCHC 35.6  30.0 - 36.0 g/dL   RDW 16.1  09.6 - 04.5 %   Platelets 187  150 - 400 K/uL  MRSA PCR SCREENING     Status: None   Collection Time    01/28/13  5:18 AM      Result Value Range   MRSA by PCR NEGATIVE  NEGATIVE   Comment:            The GeneXpert MRSA Assay (FDA     approved for NASAL specimens     only), is one component of a     comprehensive MRSA colonization     surveillance program. It is not     intended to diagnose MRSA     infection nor to guide or     monitor treatment for     MRSA infections.  BASIC METABOLIC PANEL     Status: Abnormal   Collection Time    01/28/13  7:00 AM      Result Value Range   Sodium 139  135 - 145 mEq/L   Potassium 4.1  3.5 - 5.1 mEq/L   Chloride 103  96 - 112 mEq/L   CO2 28  19 - 32 mEq/L   Glucose, Bld 111 (*)  70 - 99 mg/dL   BUN 16  6 - 23 mg/dL   Creatinine, Ser 4.09  0.50 - 1.35 mg/dL   Calcium 9.3  8.4 - 81.1 mg/dL   GFR calc non Af Amer >90  >90 mL/min   GFR calc Af Amer >90  >90 mL/min   Comment:            The eGFR has been calculated     using the CKD EPI equation.     This calculation has not been     validated in all clinical     situations.     eGFR's persistently     <90 mL/min signify     possible Chronic Kidney Disease.  CBC     Status: None   Collection Time    01/28/13  7:00 AM      Result Value Range   WBC 7.4  4.0 - 10.5 K/uL   RBC 4.67  4.22 - 5.81 MIL/uL   Hemoglobin 14.5  13.0 - 17.0 g/dL   HCT 91.4  78.2 - 95.6 %   MCV 86.9  78.0 - 100.0 fL   MCH 31.0  26.0 - 34.0 pg   MCHC 35.7  30.0 - 36.0 g/dL   RDW 21.3  08.6 - 57.8 %   Platelets 203  150 - 400 K/uL  LIPID PANEL     Status: Abnormal   Collection Time    01/28/13  7:00 AM      Result Value Range   Cholesterol 178  0 - 200 mg/dL   Triglycerides 469 (*) <150 mg/dL   HDL 38 (*) >62 mg/dL   Total CHOL/HDL Ratio 4.7     VLDL 49 (*) 0 - 40 mg/dL   LDL Cholesterol 91  0 - 99 mg/dL   Comment:            Total Cholesterol/HDL:CHD Risk     Coronary Heart Disease Risk Table                         Men   Women      1/2  Average Risk   3.4   3.3      Average Risk       5.0   4.4      2 X Average Risk   9.6   7.1      3 X Average Risk  23.4   11.0                Use the calculated Patient Ratio     above and the CHD Risk Table     to determine the patient's CHD Risk.                ATP III CLASSIFICATION (LDL):      <100     mg/dL   Optimal      098-119  mg/dL   Near or Above                        Optimal      130-159  mg/dL   Borderline      147-829  mg/dL   High      >562     mg/dL   Very High  GLUCOSE, CAPILLARY     Status: Abnormal   Collection Time    01/28/13  7:30 AM      Result Value Range   Glucose-Capillary 114 (*) 70 - 99 mg/dL  TROPONIN I     Status: None   Collection Time    01/28/13  10:10 AM      Result Value Range   Troponin I <0.30  <0.30 ng/mL   Comment:            Due to the release kinetics of cTnI,     a negative result within the first hours     of the onset of symptoms does not rule out     myocardial infarction with certainty.     If myocardial infarction is still suspected,     repeat the test at appropriate intervals.  HEPARIN LEVEL (UNFRACTIONATED)     Status: None   Collection Time    01/28/13 10:10 AM      Result Value Range   Heparin Unfractionated 0.32  0.30 - 0.70 IU/mL   Comment:            IF HEPARIN RESULTS ARE BELOW     EXPECTED VALUES, AND PATIENT     DOSAGE HAS BEEN CONFIRMED,     SUGGEST FOLLOW UP TESTING     OF ANTITHROMBIN III LEVELS.  PROTIME-INR     Status: None   Collection Time    01/28/13 10:10 AM      Result Value Range   Prothrombin Time 12.8  11.6 - 15.2 seconds   INR 0.97  0.00 - 1.49    Disposition:  Follow-up Information   Follow up with Abelino Derrick, PA-C On 02/10/2013. (2pm)    Contact information:   [address] [place] [address] [phone]       Discharge Medications:    Medication List    TAKE these medications       aspirin 325 MG tablet  Take 1 tablet (325 mg total) by mouth daily.     isosorbide mononitrate 30 MG 24 hr tablet  Commonly known as:  IMDUR  Take 0.5 tablets (15 mg total) by mouth daily.     nitroGLYCERIN 0.4 MG SL tablet  Commonly known as:  NITROSTAT  Place 1 tablet (0.4  mg total) under the tongue every 5 (five) minutes as needed for chest pain.     omeprazole 20 MG tablet  Commonly known as:  PRILOSEC OTC  Take 20 mg by mouth daily.     simvastatin 20 MG tablet  Commonly known as:  ZOCOR  Take 1 tablet (20 mg total) by mouth every evening.        Outstanding Labs/Studies: Hgb A1C  Duration of Discharge Encounter: Greater than 30 minutes including physician time.  Jolene Provost PA-C 01/28/2013 2:54 PM

## 2013-01-28 NOTE — Progress Notes (Signed)
ANTICOAGULATION CONSULT NOTE - Initial Consult  Pharmacy Consult for heparin Indication: chest pain/ACS  No Known Allergies  Patient Measurements:   Heparin Dosing Weight: 70.3 kg  Vital Signs: Temp: 98.4 F (36.9 C) (04/22 2332) Temp src: Oral (04/22 2332) BP: 140/77 mmHg (04/23 0345) Pulse Rate: 55 (04/23 0345)  Labs:  Recent Labs  01/27/13 2255  HGB 15.5  HCT 42.9  PLT 206  CREATININE 1.14    The CrCl is unknown because both a height and weight (above a minimum accepted value) are required for this calculation.   Medical History: Past Medical History  Diagnosis Date  . Acid reflux   . Hypertension   . Coronary artery disease     Medications:   (Not in a hospital admission)  Assessment: 47 yo man with known CAD to start heparin for CP.  Baseline Hg 15.5, PTLC 206 Goal of Therapy:  Heparin level 0.3-0.7 units/ml Monitor platelets by anticoagulation protocol: Yes   Plan:  Heparin bolus 3500 units and drip at 1000 units/hr Check heparin level 6 hours after start Daily heparin level and CBC while on heparin.  Monitor for s&s bleeding.  Talbert Cage Poteet 01/28/2013,4:03 AM

## 2013-01-28 NOTE — Consult Note (Signed)
Reason for Consult: Chest pain  Requesting Physician: [facility]  HPI: This is a 47 y.o. male with a past medical history significant for CAD. He had an RCA and CFX DES in Dec 2010 after a negative Myoview. He was admitted in Aug 2013 with chest pain and ruled out for an MI. He came in for an OP cath a few days later that revealed patent stents and moderate residual LAD and Dx disease. He has since lost his job and was unable to afford any medications. He has not been seen in the office.  He presented 4/22 pm with SSCP which he describes as med sternal. No radiation to his arms or jaw. It lasts "2-3 seconds" and is similar to chest pain in the past that he has associated with angina. Troponin is negative and his EKG is without acute changes.   PMHx:  Past Medical History  Diagnosis Date  . Acid reflux   . Hypertension   . Coronary artery disease 12/10    RCA/CFX DES   Past Surgical History  Procedure Laterality Date  . Coronary stent placement  12/10    RCA/CFX DES    FAMHx: Family History  Problem Relation Age of Onset  . CAD Father 22    CABG    SOCHx:  reports that he has been smoking Cigarettes.  He has been smoking about 1.00 pack per day. He does not have any smokeless tobacco history on file. He reports that  drinks alcohol. He reports that he uses illicit drugs (Marijuana) about 7 times per week.  ALLERGIES: No Known Allergies  ROS: Pertinent items are noted in HPI. He denies fever, chills, or SOB. He has had no GI bleeding.  HOME MEDICATIONS: Prescriptions prior to admission  Medication Sig Dispense Refill  . aspirin 325 MG tablet Take 1 tablet (325 mg total) by mouth daily.      . nitroGLYCERIN (NITROSTAT) 0.4 MG SL tablet Place 1 tablet (0.4 mg total) under the tongue every 5 (five) minutes as needed for chest pain.  25 tablet  2  . omeprazole (PRILOSEC OTC) 20 MG tablet Take 20 mg by mouth daily.        HOSPITAL MEDICATIONS: I have reviewed the patient's  current medications.  VITALS: Blood pressure 133/65, pulse 55, temperature 97.8 F (36.6 C), temperature source Oral, resp. rate 17, SpO2 92.00%.  PHYSICAL EXAM: General appearance: alert, cooperative, appears stated age and no distress Neck: no carotid bruit and no JVD Lungs: decreased breath sounds c/w COPD Heart: regular rate and rhythm, S1, S2 normal, no murmur, click, rub or gallop Abdomen: soft, non-tender; bowel sounds normal; no masses,  no organomegaly Extremities: extremities normal, atraumatic, no cyanosis or edema Pulses: 2+ and symmetric Skin: Skin color, texture, turgor normal. No rashes or lesions Neurologic: Grossly normal  LABS: Results for orders placed during the hospital encounter of 01/28/13 (from the past 48 hour(s))  CBC WITH DIFFERENTIAL     Status: Abnormal   Collection Time    01/27/13 10:55 PM      Result Value Range   WBC 7.5  4.0 - 10.5 Savage/uL   RBC 4.87  4.22 - 5.81 MIL/uL   Hemoglobin 15.5  13.0 - 17.0 g/dL   HCT 16.1  09.6 - 04.5 %   MCV 88.1  78.0 - 100.0 fL   MCH 31.8  26.0 - 34.0 pg   MCHC 36.1 (*) 30.0 - 36.0 g/dL   RDW 40.9  81.1 - 91.4 %  Platelets 206  150 - 400 Savage/uL   Neutrophils Relative 60  43 - 77 %   Neutro Abs 4.5  1.7 - 7.7 Savage/uL   Lymphocytes Relative 28  12 - 46 %   Lymphs Abs 2.1  0.7 - 4.0 Savage/uL   Monocytes Relative 8  3 - 12 %   Monocytes Absolute 0.6  0.1 - 1.0 Savage/uL   Eosinophils Relative 4  0 - 5 %   Eosinophils Absolute 0.3  0.0 - 0.7 Savage/uL   Basophils Relative 0  0 - 1 %   Basophils Absolute 0.0  0.0 - 0.1 Savage/uL  COMPREHENSIVE METABOLIC PANEL     Status: Abnormal   Collection Time    01/27/13 10:55 PM      Result Value Range   Sodium 141  135 - 145 mEq/L   Potassium 4.0  3.5 - 5.1 mEq/L   Chloride 104  96 - 112 mEq/L   CO2 26  19 - 32 mEq/L   Glucose, Bld 167 (*) 70 - 99 mg/dL   BUN 16  6 - 23 mg/dL   Creatinine, Ser 4.69  0.50 - 1.35 mg/dL   Calcium 9.5  8.4 - 62.9 mg/dL   Total Protein 6.4  6.0 - 8.3 g/dL    Albumin 3.7  3.5 - 5.2 g/dL   AST 17  0 - 37 U/L   ALT 14  0 - 53 U/L   Alkaline Phosphatase 86  39 - 117 U/L   Total Bilirubin 0.4  0.3 - 1.2 mg/dL   GFR calc non Af Amer 75 (*) >90 mL/min   GFR calc Af Amer 87 (*) >90 mL/min   Comment:            The eGFR has been calculated     using the CKD EPI equation.     This calculation has not been     validated in all clinical     situations.     eGFR's persistently     <90 mL/min signify     possible Chronic Kidney Disease.  POCT I-STAT TROPONIN I     Status: None   Collection Time    01/27/13 11:08 PM      Result Value Range   Troponin i, poc 0.00  0.00 - 0.08 ng/mL   Comment 3            Comment: Due to the release kinetics of cTnI,     a negative result within the first hours     of the onset of symptoms does not rule out     myocardial infarction with certainty.     If myocardial infarction is still suspected,     repeat the test at appropriate intervals.  TROPONIN I     Status: None   Collection Time    01/28/13  3:29 AM      Result Value Range   Troponin I <0.30  <0.30 ng/mL   Comment:            Due to the release kinetics of cTnI,     a negative result within the first hours     of the onset of symptoms does not rule out     myocardial infarction with certainty.     If myocardial infarction is still suspected,     repeat the test at appropriate intervals.  CREATININE, SERUM     Status: Abnormal   Collection Time    01/28/13  3:29 AM      Result Value Range   Creatinine, Ser 1.11  0.50 - 1.35 mg/dL   GFR calc non Af Amer 77 (*) >90 mL/min   GFR calc Af Amer 90 (*) >90 mL/min   Comment:            The eGFR has been calculated     using the CKD EPI equation.     This calculation has not been     validated in all clinical     situations.     eGFR's persistently     <90 mL/min signify     possible Chronic Kidney Disease.  CBC     Status: None   Collection Time    01/28/13  4:35 AM      Result Value Range    WBC 7.5  4.0 - 10.5 Savage/uL   RBC 4.77  4.22 - 5.81 MIL/uL   Hemoglobin 14.8  13.0 - 17.0 g/dL   HCT 40.9  81.1 - 91.4 %   MCV 87.2  78.0 - 100.0 fL   MCH 31.0  26.0 - 34.0 pg   MCHC 35.6  30.0 - 36.0 g/dL   RDW 78.2  95.6 - 21.3 %   Platelets 187  150 - 400 Savage/uL  MRSA PCR SCREENING     Status: None   Collection Time    01/28/13  5:18 AM      Result Value Range   MRSA by PCR NEGATIVE  NEGATIVE   Comment:            The GeneXpert MRSA Assay (FDA     approved for NASAL specimens     only), is one component of a     comprehensive MRSA colonization     surveillance program. It is not     intended to diagnose MRSA     infection nor to guide or     monitor treatment for     MRSA infections.  BASIC METABOLIC PANEL     Status: Abnormal   Collection Time    01/28/13  7:00 AM      Result Value Range   Sodium 139  135 - 145 mEq/L   Potassium 4.1  3.5 - 5.1 mEq/L   Chloride 103  96 - 112 mEq/L   CO2 28  19 - 32 mEq/L   Glucose, Bld 111 (*) 70 - 99 mg/dL   BUN 16  6 - 23 mg/dL   Creatinine, Ser 0.86  0.50 - 1.35 mg/dL   Calcium 9.3  8.4 - 57.8 mg/dL   GFR calc non Af Amer >90  >90 mL/min   GFR calc Af Amer >90  >90 mL/min   Comment:            The eGFR has been calculated     using the CKD EPI equation.     This calculation has not been     validated in all clinical     situations.     eGFR's persistently     <90 mL/min signify     possible Chronic Kidney Disease.  CBC     Status: None   Collection Time    01/28/13  7:00 AM      Result Value Range   WBC 7.4  4.0 - 10.5 Savage/uL   RBC 4.67  4.22 - 5.81 MIL/uL   Hemoglobin 14.5  13.0 - 17.0 g/dL   HCT 46.9  62.9 - 52.8 %  MCV 86.9  78.0 - 100.0 fL   MCH 31.0  26.0 - 34.0 pg   MCHC 35.7  30.0 - 36.0 g/dL   RDW 16.1  09.6 - 04.5 %   Platelets 203  150 - 400 Savage/uL  LIPID PANEL     Status: Abnormal   Collection Time    01/28/13  7:00 AM      Result Value Range   Cholesterol 178  0 - 200 mg/dL   Triglycerides 409 (*) <150 mg/dL    HDL 38 (*) >81 mg/dL   Total CHOL/HDL Ratio 4.7     VLDL 49 (*) 0 - 40 mg/dL   LDL Cholesterol 91  0 - 99 mg/dL   Comment:            Total Cholesterol/HDL:CHD Risk     Coronary Heart Disease Risk Table                         Men   Women      1/2 Average Risk   3.4   3.3      Average Risk       5.0   4.4      2 X Average Risk   9.6   7.1      3 X Average Risk  23.4   11.0                Use the calculated Patient Ratio     above and the CHD Risk Table     to determine the patient's CHD Risk.                ATP III CLASSIFICATION (LDL):      <100     mg/dL   Optimal      191-478  mg/dL   Near or Above                        Optimal      130-159  mg/dL   Borderline      295-621  mg/dL   High      >308     mg/dL   Very High    IMAGING: Dg Chest 2 View  01/28/2013  *RADIOLOGY REPORT*  Clinical Data: Chest pain  CHEST - 2 VIEW  Comparison: 05/16/2012  Findings: Heart size upper normal.  Mediastinal contours otherwise within normal range.  No focal consolidation, pleural effusion, or pneumothorax.  No acute osseous finding.  IMPRESSION: No radiographic evidence of acute cardiopulmonary process.   Original Report Authenticated By: Jearld Lesch, M.D.    EKG- NSR, SB  IMPRESSION:  Principal Problem:   Unstable angina  Active Problems:   CAD, RCA/CFX DES 12/10 with residual 50-60% LAD.- Cath 05/19/12- no ISR- med Rx   Non-compliance with treatment   HTN (hypertension)   Dyslipidemia, untreated   Smoker   RECOMMENDATION: MD to see. We will take on our service. He is not on Beta Blocker secondary to sinus bradycardia  Time Spent Directly with Patient: 45 minutes  Eric Savage,Eric Savage 01/28/2013, 8:09 AM    Patient seen and examined. Agree with assessment and plan. Pleasant 47 yo WM with known CAD, s/p stents to RCA and LCX after a negative myoview. He has continued to smoke and has discontinued all of his meds for at least 6 months. He presents with recurrent chest pain  which he states is similar to prior  discomfort. He has a probable dyslipidemic lipid panel with increased TG, VLDL and low HDL suggesting LDL discordance with particle concentration. Discussed options with patient. With a prior nuclear study not detecting significant CAD, recommend definitive cath. Will plan later today.   Lennette Bihari, MD, [facility] 01/28/2013 9:00 AM

## 2013-01-28 NOTE — CV Procedure (Signed)
Eric Savage is a 47 y.o. male    [id number]  [id number] LOCATION:  FACILITY: [facility]  PHYSICIAN: Lennette Bihari, MD, [facility] November 03, 1965   DATE OF PROCEDURE:  01/28/2013  DATE OF DISCHARGE:  [facility]  CARDIAC CATHETERIZATION     History obtained from chart review and the patient. Patient is a 47 yo WM who is s/p prior stenting to RCA and LCX in December 2010 and has documented mild residual LAD and Diagonal disease.  He has stopped all his medications for the past 6 months. He has continued to smoke and has elevated lipids. He was admitted with short episodes of similar chest pain. Definitive cardiac catheterization was recommended.   PROCEDURE DESCRIPTION:    The patient was brought to the second floor  Funk Cardiac cath lab in the postabsorptive state. He was  premedicated with Versed and fetanyl. His right groin was prepped and shaved in usual sterile fashion. Xylocaine 1% was used  for local anesthesia. A 5 French sheath was inserted into the right femoral  artery. Diagnostic catheterizatiion was done with 5 [place] LF4, FR4, and pigtail catheters. Left ventriculography was done with 25 cc Omnipaque contrast. Hemostasis was obtained by direct manual compression. The patient tolerated the procedure well.   HEMODYNAMICS:    AO SYSTOLIC/AO DIASTOLIC: 100/64   LV SYSTOLIC/LV DIASTOLIC: 100/3/14  ANGIOGRAPHIC RESULTS:   1. Left main: normal 2. LAD: 20% proximal smooth narrowing; 60 - 70% smooth mid diagonal 2 narrowing 3. Left circumflex: normal with patent stent  4. Right coronary artery: normal with patent stent  5.  Left ventriculography; RAO left ventriculogram was performed using  25 mL of Omnipaque dye at 12 mL/second. The overall LVEF estimated  60 - 65 %  without wall motion abnormalities.     IMPRESSION: No evidence for restenosis of either RCA or LCX. Mild LAD 20% narrowing and 60 - 70% smooth mid diagonal 2  stenosis.  Plan: medical therapy, smoking cessation, lipid therapy.  Lennette Bihari, MD, [facility] 01/28/2013 11:32 AM

## 2013-01-28 NOTE — Progress Notes (Signed)
Verbalized that whenever he moves, he feels a dull pain on midsternum unable to scale it. Bedrest emphasized. Continue to monitor.

## 2013-01-28 NOTE — ED Notes (Signed)
Report given to [facility] RN, pt transported via stretcher on a monitor by [employer]

## 2013-01-28 NOTE — Progress Notes (Signed)
To the cath lab by bed, . With on and off chestpain.

## 2013-01-28 NOTE — H&P (Addendum)
[facility] History and Physical  Eric Savage ZOX:[id number] DOB: [birth year] DOA: 01/28/2013  Referring physician: Dr. Dierdre Highman. PCP: No PCP Per Patient  Specialists: [facility]  Chief Complaint: Chest pain.  HPI: Eric Savage is a 47 y.o. male history of CAD status post stenting, hypertension and hyperlipidemia has not been taking his medications for last 6 months because he was not able to forward presented because of chest pain. Patient states that he has been having off and on chest pain for last 2 weeks is retrosternal and lasts for few seconds and is pressure-like. Along with this chest pain he also had mild shortness of breath. His symptoms improved within a few seconds and has not used nitroglycerin. He has stents 2 years ago and his last cardiac catheter last in August. At this time patient is chest pain-free. Cardiac enzymes EKG chest x-ray were unremarkable. Patient will be admitted for further management.  Review of Systems: As presented in the history of presenting illness, rest negative.  Past Medical History  Diagnosis Date  . Acid reflux   . Hypertension   . Coronary artery disease    Past Surgical History  Procedure Laterality Date  . Angioplasty    . Coronary stent placement     Social History:  reports that he has been smoking.  He does not have any smokeless tobacco history on file. He reports that  drinks alcohol. He reports that he uses illicit drugs (Marijuana) about 7 times per week. Lives at home. where does patient live-- Can do ADLs. Can patient participate in ADLs?  No Known Allergies  Family History  Problem Relation Age of Onset  . CAD Father       Prior to Admission medications   Medication Sig Start Date End Date Taking? Authorizing Provider  aspirin 325 MG tablet Take 1 tablet (325 mg total) by mouth daily. 05/17/12 05/17/13 Yes Luke K Kilroy, PA-C  nitroGLYCERIN (NITROSTAT) 0.4 MG SL tablet Place 1 tablet (0.4 mg  total) under the tongue every 5 (five) minutes as needed for chest pain. 05/17/12 05/17/13 Yes Luke K Kilroy, PA-C  omeprazole (PRILOSEC OTC) 20 MG tablet Take 20 mg by mouth daily.   Yes Historical Provider, MD   Physical Exam: Filed Vitals:   01/27/13 2332 01/28/13 0256 01/28/13 0300 01/28/13 0315  BP: 143/88 135/89 135/87 139/80  Pulse: 67 59 65 56  Temp: 98.4 F (36.9 C)     TempSrc: Oral     Resp: 16 20    SpO2: 100% 98% 99% 96%     General:  Well-developed and nourished.  Eyes: Anicteric no pallor.  ENT: No discharge from ears eyes nose and mouth.  Neck: No mass felt.  Cardiovascular: S1-S2 heard.  Respiratory: No rhonchi no crepitations.  Abdomen: Soft nontender bowel sounds present.  Skin: No rash.  Musculoskeletal: No edema.  Psychiatric: Appears normal.  Neurologic: Alert awake oriented to time place and person.  Labs on Admission:  Basic Metabolic Panel:  Recent Labs Lab 01/27/13 2255  NA 141  K 4.0  CL 104  CO2 26  GLUCOSE 167*  BUN 16  CREATININE 1.14  CALCIUM 9.5   Liver Function Tests:  Recent Labs Lab 01/27/13 2255  AST 17  ALT 14  ALKPHOS 86  BILITOT 0.4  PROT 6.4  ALBUMIN 3.7   No results found for this basename: LIPASE, AMYLASE,  in the last 168 hours No results found for this basename: AMMONIA,  in  the last 168 hours CBC:  Recent Labs Lab 01/27/13 2255  WBC 7.5  NEUTROABS 4.5  HGB 15.5  HCT 42.9  MCV 88.1  PLT 206   Cardiac Enzymes: No results found for this basename: CKTOTAL, CKMB, CKMBINDEX, TROPONINI,  in the last 168 hours  BNP (last 3 results) No results found for this basename: PROBNP,  in the last 8760 hours CBG: No results found for this basename: GLUCAP,  in the last 168 hours  Radiological Exams on Admission: Dg Chest 2 View  01/28/2013  *RADIOLOGY REPORT*  Clinical Data: Chest pain  CHEST - 2 VIEW  Comparison: 05/16/2012  Findings: Heart size upper normal.  Mediastinal contours otherwise within  normal range.  No focal consolidation, pleural effusion, or pneumothorax.  No acute osseous finding.  IMPRESSION: No radiographic evidence of acute cardiopulmonary process.   Original Report Authenticated By: Jearld Lesch, M.D.     EKG: Independently reviewed. Normal sinus rhythm.  Assessment/Plan Principal Problem:   Chest pain Active Problems:   CAD (coronary artery disease), RCA/CFX DES 12/10 with residual 50-60% LAD.   HTN (hypertension)   Smoker   1. Chest pain and history of CAD status post stenting - presently chest pain-free. We'll keep patient in feeling anticipation of possible procedure. Aspirin. Nitroglycerin when necessary. Check drug screen. Cardiology consult. 2. Hypertension and hyperlipidemia - presently on no medications as patient cannot afford. Check lipid panel. Closely follow blood pressure trends. 3. Tobacco abuse - strongly advised to quit smoking.     Code Status: Full code.  Family Communication: None.  Disposition Plan: Admit for observation.    Charity Tessier N. [facility] Pager [phone].  If 7PM-7AM, please contact night-coverage [email or web address] Password [facility] 01/28/2013, 3:31 AM Addendum - patient started developing chest pain again. At this time we will start patient on heparin infusion. Aspirin. Nitroglycerin patch. Recheck EKG and cardiac markers. Transferred to step down.  Midge Minium

## 2013-02-19 ENCOUNTER — Telehealth: Payer: Self-pay | Admitting: Cardiology

## 2013-04-03 ENCOUNTER — Telehealth: Payer: Self-pay | Admitting: *Deleted

## 2013-04-03 NOTE — Telephone Encounter (Signed)
**  Walk-In**  Pt requesting a form be completed so he can get a job.  Stated he needs cardiac clearance to work the job.  Pt informed records indicate he has not been seen since March 2012 and will need to be seen before any forms can be completed.  Pt taken to scheduling and financial services to set up appt.

## 2013-04-07 ENCOUNTER — Encounter: Payer: Self-pay | Admitting: Cardiology

## 2013-04-07 ENCOUNTER — Ambulatory Visit (INDEPENDENT_AMBULATORY_CARE_PROVIDER_SITE_OTHER): Payer: Self-pay | Admitting: Cardiology

## 2013-04-07 VITALS — BP 118/84 | HR 57 | Ht 66.0 in | Wt 172.7 lb

## 2013-04-07 DIAGNOSIS — R079 Chest pain, unspecified: Secondary | ICD-10-CM

## 2013-04-07 DIAGNOSIS — E785 Hyperlipidemia, unspecified: Secondary | ICD-10-CM

## 2013-04-07 DIAGNOSIS — Z7189 Other specified counseling: Secondary | ICD-10-CM

## 2013-04-07 DIAGNOSIS — Z716 Tobacco abuse counseling: Secondary | ICD-10-CM

## 2013-04-07 DIAGNOSIS — Z9119 Patient's noncompliance with other medical treatment and regimen: Secondary | ICD-10-CM

## 2013-04-07 DIAGNOSIS — I2510 Atherosclerotic heart disease of native coronary artery without angina pectoris: Secondary | ICD-10-CM

## 2013-04-07 DIAGNOSIS — F17200 Nicotine dependence, unspecified, uncomplicated: Secondary | ICD-10-CM

## 2013-04-07 NOTE — Patient Instructions (Addendum)
Your physician wants you to follow-up in: 1 year.  You will receive a reminder letter in the mail two months in advance. If you don't receive a letter, please call our office to schedule the follow-up appointment.  Cholesterol looked great. Cardiac catheterization was normal, with normal cardiac function. Blood Pressure & heart rate are within normal limits.   Best of luck with your job - Dr. Herbie Baltimore.

## 2013-04-12 ENCOUNTER — Encounter: Payer: Self-pay | Admitting: Cardiology

## 2013-04-12 DIAGNOSIS — Z716 Tobacco abuse counseling: Secondary | ICD-10-CM | POA: Insufficient documentation

## 2013-04-12 NOTE — Assessment & Plan Note (Signed)
Patent stents but last cath just a few months ago. He does have moderate lesions in his diagonal, but it is unkely cause symptoms. Would discontinue medical therapy with statin and aspirin. Has been long since his DES stents that he doesn't need dual antiplatelet therapy.

## 2013-04-12 NOTE — Assessment & Plan Note (Signed)
He had been on statin before and was restarted since his last hospitalization. His most recent labs were excellent with total cholesterol 126, cholesterol is a 56, HDL 48 and LDL 67. We will just continue with current regimen.

## 2013-04-12 NOTE — Assessment & Plan Note (Signed)
I think this is more to do with his lack of financial ability to get his medications then and his desire to not take his medicines. Now that he is hopefully getting a job, he'll be able to afford his medications and have health care coverage.

## 2013-04-12 NOTE — Assessment & Plan Note (Signed)
His last admission in April this year was evaluated with a cardiac catheterization that showed no evidence of obstructive coronary disease. He is on a very low dose and nitrate as well as statin. He doesn't have a lot of blood pressure and to use beta blocker or ACE inhibitor. In the past and his beta blockers and bradycardia. We may in the future consider using an ACE inhibitor or ARB but for now I am going to leave it alone.

## 2013-04-12 NOTE — Progress Notes (Signed)
Patient ID: Eric Savage, male   DOB: June 05, 1966, 47 y.o.   MRN: [id number]  Clinic Note: HPI: Eric Savage is a 47 y.o. male with a PMH below who presents today for post hospital followup and work related cardiovascular assessment. He has been a long-term patient of this practice but has not been seen for several years. His been in the hospital a few times since but has not been seen in the office, since as far as I can tell 2011.   He was a patient of Dr. Karsten Fells, and was noted to have about a years worth of intermittent chest discomfort. This is a value with the Myoview stress test that did not show any ischemia, but for continued symptoms he underwent cardiac catheterization in December 2010. This showed severe subtotal occlusion of the RCA and moderate to severe lesion in the circumflex. These are treated in in the same PCI procedure by Dr. Tresa Endo as noted below. He is subsequently been admitted at least twice for chest pain once in August 2013 when I did a catheterization showing no significant stenosis. He then was readmitted in April of this year for chest pain, and catheterization also showed no significant stenosis.  Interval History: Since his discharge, his been doing very well. His back on a statin. Recent lipids were well-controlled. He denies any further episodes of chest pain or shortness of breath with rest or exertion. His been cutting [employer] regards and weed whacking etc. etc. He is also getting pretty good exercise besides the work. He denies any PND orthopnea or edema. No melena hematochezia or hematuria. No lightheadedness, dizziness, weakness or 60/near-syncope the symptoms. No claudication symptoms.  He is currently smoking but is down to maybe 5-6 cigarettes a day from a pack to pack and half a day a few years ago. He is quite excited about how well his been doing, saying he's just about quit. He is having a little difficulty getting over the final step. He is now work  for a while it, and therefore had not off his medications for while before his last hospitalization. 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 He actually has a form for me to fill out.  Past Medical History  Diagnosis Date  . Acid reflux   . Hypertension   . Coronary artery disease 12/10    RCA/CFX DES  . High cholesterol   . Chest pain 01/2013    Evaluate cardiac catheterization, no obstructive CAD    Prior Cardiac Evaluation and Past Surgical History: Past Surgical History  Procedure Laterality Date  . Tonsillectomy  ~ 1973  . Coronary angioplasty with stent placement  09/2009    Proximal RCA - Cypher DES 3.0 mm but 33 mm postdilated to 2.7 mm; proximal CFX XIENCE V. DES 2.75 mm x 15 mm postdilated to 3.0 mm  . Cardiac catheterization  05/2012; 01/28/2013    No significant in-stent restenosis in circumflex or RCA. 60-70% moderate D2 lesion; EF 60-65%.  . Nuclear stress test  2010    Failed to show significant stenosis despite followup catheterization showing severe RCA and circumflex lesions.   No Known Allergies  Current Outpatient Prescriptions  Medication Sig Dispense Refill  . aspirin 325 MG tablet Take 1 tablet (325 mg total) by mouth daily.      . Esomeprazole Magnesium (NEXIUM PO) Take 1 tablet by mouth daily. OTC      .  isosorbide mononitrate (IMDUR) 30 MG 24 hr tablet Take 0.5 tablets (15 mg total) by mouth daily.  15 tablet  11  . nitroGLYCERIN (NITROSTAT) 0.4 MG SL tablet Place 1 tablet (0.4 mg total) under the tongue every 5 (five) minutes as needed for chest pain.  25 tablet  2  . simvastatin (ZOCOR) 20 MG tablet Take 1 tablet (20 mg total) by mouth every evening.  30 tablet  11   No current facility-administered medications for this visit.    History   Social History  . Marital Status: Single    Spouse Name: N/A    Number of Children: N/A  . Years of Education: N/A    Occupational History  . Unemployed    Social History Main Topics  . Smoking status: Current Every Day Smoker -- 0.25 packs/day for 30 years    Types: Cigarettes  . Smokeless tobacco: Not on file  . Alcohol Use: 2.4 oz/week    4 Shots of liquor per week     Comment: 01/28/2013 "takes me a month to drink 1/5th"  . Drug Use: No     Comment: 01/28/2013 last drug use ">10 yr ago"; had reported marijuana use several times a week  . Sexually Active: Not Currently   Other Topics Concern  . 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   ROS: A comprehensive Review of Systems - Negative except Any pertinent positives as above. Also minor positives below. General ROS: positive for  - sleep disturbance and weight gain negative for - fatigue, malaise or night sweats  PHYSICAL EXAM BP 118/84  Pulse 57  Ht 5\' 6"  (1.676 m)  Wt 172 lb 11.2 oz (78.336 kg)  BMI 27.89 kg/m2 General appearance: alert, cooperative, appears stated age, no distress and Pleasant mood and affect. Well-nourished and well-groomed. Answers questions appropriately. Neck: no carotid bruit, no JVD and supple, symmetrical, trachea midline Lungs: clear to auscultation bilaterally, normal percussion bilaterally and Nonlabored. Good air movement. No W./R./R. Heart: regular rate and rhythm, S1, S2 normal, no murmur, click, rub or gallop and normal apical impulse Abdomen: soft, non-tender; bowel sounds normal; no masses,  no organomegaly Extremities: extremities normal, atraumatic, no cyanosis or edema, no edema, redness or tenderness in the calves or thighs and no ulcers, gangrene or trophic changes Pulses: 2+ and symmetric Skin: Skin color, texture, turgor normal. No rashes or lesions or Highly tanned Neurologic: Grossly normal HEENT: [place]/AT, EOMI, MMM, anicteric sclera  WUJ:[id number] today: Yes Rate: 56 , Rhythm: Sinus  bradycardia, otherwise normal ECG  Recent Labs:  ASSESSMENT: Relatively stable from a cardiac standpoint. No further chest pain. Cardiac catheterization with no significant obstructive disease only the moderate D2 lesion that is probably stable from all 4 catheterizations. His cholesterol is relatively well controlled. I see no reason why he could not do work for the city without any further cardiac evaluation. He is in no increased risk of syncope or near syncope or any significant arrhythmias. His blood pressure and heart rate are well controlled. I have filled out his work release form for the city [place]. If anything his recent catheterization with selective findings is stable as he could be with his history.   The paperwork to 10-15 minutes in addition to his 15 minute visit along with at least 15-20 minutes with his chart.  Chest pain with low risk for cardiac etiology  His last admission in April this year was evaluated with a cardiac catheterization that showed no evidence of obstructive coronary disease. He is on a very low dose and nitrate as well as statin. He doesn't have a lot of blood pressure and to use beta blocker or ACE inhibitor. In the past and his beta blockers and bradycardia. We may in the future consider using an ACE inhibitor or ARB but for now I am going to leave it alone.  Dyslipidemia, untreated He had been on statin before and was restarted since his last hospitalization. His most recent labs were excellent with total cholesterol 126, cholesterol is a 56, HDL 48 and LDL 67. We will just continue with current regimen.  Non-compliance with treatment I think this is more to do with his lack of financial ability to get his medications then and his desire to not take his medicines. Now that he is hopefully getting a job, he'll be able to afford his medications and have health care coverage.  CAD, RCA/CFX DES 12/10 with residual 50-60% LAD.- Cath 05/19/12- and 01/28/13 no  ISR- med Rx Patent stents but last cath just a few months ago. He does have moderate lesions in his diagonal, but it is unkely cause symptoms. Would discontinue medical therapy with statin and aspirin. Has been long since his DES stents that he doesn't need dual antiplatelet therapy.  Tobacco abuse counseling We spell his 5 minutes, the importance of smoking cessation. He says he is just about quit. Down to 5-6 cigarettes a day. Sometimes on the weekends when he drinks no more than usual he, at has a few more cigarettes than that on Saturday nights but the most part his doing quite well. He has considered using the patch or the e-cigarettes to make that final step. I congratulated him on his efforts so far, and helped him into some other ways to potentially kick the habit. Including gown and electric cigarettes etc. He is reluctant to use things like Wellbutrin or Chantix.   PLAN: Per problem list. Orders Placed This Encounter  Procedures  . EKG 12-Lead   Followup:  1 year  Mico Spark W, MD,M.S. [facility] [address] [place], [place]  [address]  [phone] Pager # [phone]

## 2013-04-12 NOTE — Assessment & Plan Note (Addendum)
We spell his 5 minutes, the importance of smoking cessation. He says he is just about quit. Down to 5-6 cigarettes a day. Sometimes on the weekends when he drinks no more than usual he, at has a few more cigarettes than that on Saturday nights but the most part his doing quite well. He has considered using the patch or the e-cigarettes to make that final step. I congratulated him on his efforts so far, and helped him into some other ways to potentially kick the habit. Including gown and electric cigarettes etc. He is reluctant to use things like Wellbutrin or Chantix.

## 2014-01-27 NOTE — Telephone Encounter (Signed)
CLOSED ENCOUNTER BY TINA P 

## 2014-02-21 ENCOUNTER — Other Ambulatory Visit (HOSPITAL_COMMUNITY): Payer: Self-pay | Admitting: Cardiology

## 2014-02-22 NOTE — Telephone Encounter (Signed)
Rx was sent to pharmacy electronically. 

## 2014-04-08 ENCOUNTER — Encounter: Payer: Self-pay | Admitting: *Deleted

## 2014-04-19 ENCOUNTER — Ambulatory Visit (INDEPENDENT_AMBULATORY_CARE_PROVIDER_SITE_OTHER): Payer: 59 | Admitting: Cardiology

## 2014-04-19 ENCOUNTER — Encounter: Payer: Self-pay | Admitting: Cardiology

## 2014-04-19 VITALS — BP 118/76 | HR 67 | Ht 64.0 in | Wt 192.1 lb

## 2014-04-19 DIAGNOSIS — Z716 Tobacco abuse counseling: Secondary | ICD-10-CM

## 2014-04-19 DIAGNOSIS — Z7189 Other specified counseling: Secondary | ICD-10-CM

## 2014-04-19 DIAGNOSIS — F17200 Nicotine dependence, unspecified, uncomplicated: Secondary | ICD-10-CM

## 2014-04-19 DIAGNOSIS — I2510 Atherosclerotic heart disease of native coronary artery without angina pectoris: Secondary | ICD-10-CM

## 2014-04-19 DIAGNOSIS — E785 Hyperlipidemia, unspecified: Secondary | ICD-10-CM

## 2014-04-19 DIAGNOSIS — I10 Essential (primary) hypertension: Secondary | ICD-10-CM

## 2014-04-19 DIAGNOSIS — E669 Obesity, unspecified: Secondary | ICD-10-CM

## 2014-04-19 DIAGNOSIS — Z9861 Coronary angioplasty status: Secondary | ICD-10-CM

## 2014-04-19 NOTE — Patient Instructions (Signed)
Wean off of Imdur as discussed.  Keep working on weight loss!  Your physician recommends that you schedule a follow-up appointment in 1 year with Dr Herbie BaltimoreHarding.

## 2014-04-26 ENCOUNTER — Encounter: Payer: Self-pay | Admitting: Cardiology

## 2014-04-26 DIAGNOSIS — E669 Obesity, unspecified: Secondary | ICD-10-CM | POA: Insufficient documentation

## 2014-04-26 MED ORDER — LISINOPRIL 20 MG PO TABS: 20.0000 mg | ORAL_TABLET | Freq: Every day | ORAL | Status: DC

## 2014-04-26 MED ORDER — SIMVASTATIN 20 MG PO TABS: ORAL_TABLET | ORAL | Status: DC

## 2014-04-26 MED ORDER — ISOSORBIDE MONONITRATE ER 30 MG PO TB24: ORAL_TABLET | ORAL | Status: DC

## 2014-04-26 MED ORDER — NITROGLYCERIN 0.4 MG SL SUBL: 0.4000 mg | SUBLINGUAL_TABLET | SUBLINGUAL | Status: DC | PRN

## 2014-04-26 NOTE — Assessment & Plan Note (Signed)
This is a new thing for him. He is put on about 35-40 pounds since 2013. The last year he blames on reestablishing a relationship with his estranged wife (separated for 9 years). He is now eating a lot more "home-cooked meals" instead of trying to figure out how to make food for himself. 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   He is worried he is getting a bit lazy

## 2014-04-26 NOTE — Assessment & Plan Note (Signed)
See above

## 2014-04-26 NOTE — Assessment & Plan Note (Signed)
Well-controlled on ACE inhibitor. 

## 2014-04-26 NOTE — Progress Notes (Signed)
PCP: Millsaps, KIMBERLY M, NP  Clinic Note: ChiefEgbert Savage Complaint  Patient presents with  . Follow-up    1 yr; CAD-PCI   HPI: Eric Savage is a 48 y.o. male with a Cardiovascular Problem List below who presents today for annual follow up with CAD. He is very happy to niacin he is to go to good news: He qualified to get the job with the city of [place] he was looking forward to getting, and has reestablished a relationship with his long estranged wife. He seems to be quite happy with how things are going.  Interval History: He is unwell over the last year with no major symptoms. He does sometimes feel tired in the day, but has been working hard to make sure he keeps his job. The work he does is relatively taxing physically. He is essentially negative a cardiac standpoint his worsening symptoms, Cardiovascular ROS: no chest pain or dyspnea on exertion negative for - edema, irregular heartbeat, murmur, orthopnea, palpitations, paroxysmal nocturnal dyspnea, rapid heart rate, shortness of breath or Syncope/pre--syncope, TIA/versus BX, lightheadedness or dizziness, melena, hematochezia, hematuria, epistaxis; claudication:  Past Medical History  Diagnosis Date  . Acid reflux   . Hypertension   . CAD S/P percutaneous coronary angioplasty 12/10    RCA/CFX DES  . High cholesterol   . Chest pain 01/2013    Evaluate cardiac catheterization, no obstructive CAD    Prior Cardiac Evaluation and Past Surgical History: Past Surgical History  Procedure Laterality Date  . Tonsillectomy  ~ 1973  . Coronary angioplasty with stent placement  09/2009    Proximal RCA - Cypher DES 3.0 mm but 33 mm postdilated to 2.7 mm; proximal CFX XIENCE V. DES 2.75 mm x 15 mm postdilated to 3.0 mm  . Cardiac catheterization  05/2012; 01/28/2013    No significant in-stent restenosis in circumflex or RCA. 60-70% moderate D2 lesion; EF 60-65%.  . Nm myocar perf wall motion  01/04/2011    Protocol:Bruce, post stress EF=69%,  Exercise Cap 13 METS, attenuation defect in inferior region of myocardium, no sig. ischemia demonstrated  . Transthoracic echocardiogram  05/13/2009    EF =>55%, mild mitral regurg, trace tricuspid regurg.    MEDICATIONS AND ALLERGIES REVIEWED IN EPIC No Change in Social and Family History  ROS: A comprehensive Review of Systems - Negative except The fact is gaining weight and eating more frequently. He is still trying to cut back on cigarettes but has not fully quit.  Wt Readings from Last 3 Encounters:  04/19/14 192 lb 1.6 oz (87.136 kg)  04/07/13 172 lb 11.2 oz (78.336 kg)  05/19/12 155 lb (70.308 kg)   PHYSICAL EXAM BP 118/76  Pulse 67  Ht 5\' 4"  (1.626 Savage)  Wt 192 lb 1.6 oz (87.136 kg)  BMI 32.96 kg/m2 General appearance: alert, cooperative, appears stated age, no distress and Pleasant mood and affect. Well-nourished and well-groomed. Answers questions appropriately.  HEENT: [place]/AT, EOMI, MMM, anicteric sclera Neck: no carotid bruit, no JVD and supple, symmetrical, trachea midline  Lungs: CTAB, normal percussion bilaterally and Nonlabored. Good air movement. No W./R./R.  Heart: RRR, normal S1 & S2; No Savage/R/G; normal apical impulse  Abdomen: soft, non-tender; bowel sounds normal; no masses, no organomegaly  Extremities: extremities normal, atraumatic, no cyanosis or edema,Pulses: 2+ and symmetric  Neurologic: Grossly normal    Adult ECG Report  Rate: 67 ;  Rhythm: normal sinus rhythm ; normal EKG   Labs were checked by PCP earlier this year.  ASSESSMENT / PLAN: CAD, RCA/CFX DES 12/10 with residual 50-60% LAD.- Cath 05/19/12- and 01/28/13 no ISR- med Rx No active symptoms with patent stents last year. Moderate diagonal disease but no symptoms. Not currently on beta blocker due to fatigue and low resting heart rate. He is on an ACE inhibitor nitrate and statin along with aspirin. No GI bleeding from aspirin. As he has not had any further anginal symptoms, we can try to wean him off  the Imdur may be 2-3 times a week to see if he has any symptoms. If not we could potentially come off all together  Smoker He is still smoking a little bit he says. He is gradually trying to quit but is finding it more difficult than expected. We discussed various options including the [place] Quitline as well as vapor cigarettes, patches etc. He is ventilating tube probably doing some type of supplementation but is not quite ready yet.. Over 5 minutes was spent discussing options.  Tobacco abuse counseling See above.   Obesity (BMI 30-39.9) This is a new thing for him. He is put on about 35-40 pounds since 2013. The last year he blames on reestablishing a relationship with his estranged wife (separated for 9 years). He is now eating a lot more "home-cooked meals" instead of trying to figure out how to make food for himself. 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   He is worried he is getting a bit lazy   Dyslipidemia, goal LDL below 70 On simvastatin. I have not seen recent lab slip as of July of last year his total cholesterol was 126 LDL 67 HDL 48 and is quite well controlled. He called his PCP has recently check his labs, and was told that they were fine.  Essential hypertension Well controlled on ACE inhibitor    Orders Placed This Encounter  Procedures  . EKG 12-Lead   Meds ordered this encounter  Medications  . DISCONTD: lisinopril (PRINIVIL,ZESTRIL) 20 MG tablet    Sig:   . DISCONTD: traMADol-acetaminophen (ULTRACET) 37.5-325 MG per tablet    Sig:   . aspirin 325 MG tablet    Sig: Take 325 mg by mouth daily.  . simvastatin (ZOCOR) 20 MG tablet    Sig: TAKE 1 TABLET (20 MG TOTAL) BY MOUTH EVERY EVENING.    Dispense:  30 tablet    Refill:  2  . nitroGLYCERIN (NITROSTAT) 0.4 MG SL tablet    Sig: Place 1 tablet (0.4 mg total) under the tongue every 5 (five) minutes as needed for chest pain.     Dispense:  25 tablet    Refill:  2  . lisinopril (PRINIVIL,ZESTRIL) 20 MG tablet    Sig: Take 1 tablet (20 mg total) by mouth daily.    Dispense:  90 tablet    Refill:  3  . isosorbide mononitrate (IMDUR) 30 MG 24 hr tablet    Sig: TAKE 1/2 TABLET BY MOUTH DAILY    Dispense:  15 tablet    Refill:  2    Followup: 12 months  Azrael Maddix W. Herbie Baltimore, Savage.D., Savage.S. Interventional Cardiologist [facility]

## 2014-04-26 NOTE — Assessment & Plan Note (Signed)
He is still smoking a little bit he says. He is gradually trying to quit but is finding it more difficult than expected. We discussed various options including the [place] Quitline as well as vapor cigarettes, patches etc. He is ventilating tube probably doing some type of supplementation but is not quite ready yet.. Over 5 minutes was spent discussing options.

## 2014-04-26 NOTE — Assessment & Plan Note (Signed)
On simvastatin. I have not seen recent lab slip as of July of last year his total cholesterol was 126 LDL 67 HDL 48 and is quite well controlled. He called his PCP has recently check his labs, and was told that they were fine.

## 2014-04-26 NOTE — Assessment & Plan Note (Addendum)
No active symptoms with patent stents last year. Moderate diagonal disease but no symptoms. Not currently on beta blocker due to fatigue and low resting heart rate. He is on an ACE inhibitor nitrate and statin along with aspirin. No GI bleeding from aspirin. As he has not had any further anginal symptoms, we can try to wean him off the Imdur may be 2-3 times a week to see if he has any symptoms. If not we could potentially come off all together

## 2014-05-21 ENCOUNTER — Other Ambulatory Visit: Payer: Self-pay | Admitting: *Deleted

## 2014-05-21 MED ORDER — ISOSORBIDE MONONITRATE ER 30 MG PO TB24: 15.0000 mg | ORAL_TABLET | Freq: Every day | ORAL | Status: DC

## 2014-05-21 NOTE — Telephone Encounter (Signed)
Rx was sent to pharmacy electronically. 

## 2014-09-16 ENCOUNTER — Encounter (HOSPITAL_COMMUNITY): Payer: Self-pay | Admitting: Cardiology

## 2014-10-18 ENCOUNTER — Telehealth: Payer: Self-pay | Admitting: Cardiology

## 2014-10-18 NOTE — Telephone Encounter (Signed)
Pt called in wanting a new prescription for NTG called in to the [facility] on [address]  Thanks

## 2014-10-19 NOTE — Telephone Encounter (Signed)
Called patient. Med already refilled.

## 2014-12-05 ENCOUNTER — Other Ambulatory Visit: Payer: Self-pay | Admitting: Cardiology

## 2014-12-06 NOTE — Telephone Encounter (Signed)
Rx has been sent to the pharmacy electronically. ° °

## 2015-01-21 ENCOUNTER — Other Ambulatory Visit: Payer: Self-pay | Admitting: Cardiology

## 2015-01-21 MED ORDER — SIMVASTATIN 20 MG PO TABS: ORAL_TABLET | ORAL | Status: DC

## 2015-01-21 NOTE — Telephone Encounter (Signed)
°  1. Which medications need to be refilled? Simvastatin  2. Which pharmacy is medication to be sent to?[facility]  3. Do they need a 30 day or 90 day supply? 90 and refills  4. Would they like a call back once the medication has been sent to the pharmacy? yes

## 2015-01-21 NOTE — Telephone Encounter (Signed)
E-SENT TO PHARMACY PATIENT AWARE 

## 2015-05-06 ENCOUNTER — Ambulatory Visit: Payer: Self-pay | Admitting: Cardiology

## 2015-05-06 ENCOUNTER — Other Ambulatory Visit: Payer: Self-pay | Admitting: Cardiology

## 2015-05-06 NOTE — Telephone Encounter (Signed)
REFILL 

## 2015-06-21 ENCOUNTER — Ambulatory Visit: Payer: Self-pay | Admitting: Cardiology

## 2015-07-22 ENCOUNTER — Telehealth: Payer: Self-pay | Admitting: Cardiology

## 2015-07-22 NOTE — Telephone Encounter (Signed)
07/22/2015 Received faxed records from [facility] for upcoming appointment with Dr. Herbie BaltimoreHarding.  Records giving to [facility] cbr

## 2015-07-25 ENCOUNTER — Other Ambulatory Visit: Payer: Self-pay | Admitting: *Deleted

## 2015-07-25 ENCOUNTER — Ambulatory Visit (INDEPENDENT_AMBULATORY_CARE_PROVIDER_SITE_OTHER): Payer: Commercial Managed Care - HMO | Admitting: Cardiology

## 2015-07-25 ENCOUNTER — Encounter: Payer: Self-pay | Admitting: Cardiology

## 2015-07-25 VITALS — BP 142/92 | HR 64 | Ht 63.0 in | Wt 192.0 lb

## 2015-07-25 DIAGNOSIS — Z9861 Coronary angioplasty status: Secondary | ICD-10-CM | POA: Diagnosis not present

## 2015-07-25 DIAGNOSIS — E785 Hyperlipidemia, unspecified: Secondary | ICD-10-CM | POA: Diagnosis not present

## 2015-07-25 DIAGNOSIS — I2510 Atherosclerotic heart disease of native coronary artery without angina pectoris: Secondary | ICD-10-CM | POA: Diagnosis not present

## 2015-07-25 DIAGNOSIS — Z9119 Patient's noncompliance with other medical treatment and regimen: Secondary | ICD-10-CM

## 2015-07-25 DIAGNOSIS — Z716 Tobacco abuse counseling: Secondary | ICD-10-CM

## 2015-07-25 DIAGNOSIS — E669 Obesity, unspecified: Secondary | ICD-10-CM

## 2015-07-25 DIAGNOSIS — I10 Essential (primary) hypertension: Secondary | ICD-10-CM

## 2015-07-25 DIAGNOSIS — Z91199 Patient's noncompliance with other medical treatment and regimen due to unspecified reason: Secondary | ICD-10-CM

## 2015-07-25 MED ORDER — LISINOPRIL 20 MG PO TABS: 20.0000 mg | ORAL_TABLET | Freq: Every day | ORAL | Status: DC

## 2015-07-25 MED ORDER — SIMVASTATIN 20 MG PO TABS: ORAL_TABLET | ORAL | Status: DC

## 2015-07-25 NOTE — Patient Instructions (Signed)
MEDICATION SENT TO PHARMACY  PLEASE DO LABS AT PRIMARY OFFICE--LIPID,CMP  Your physician wants you to follow-up in 12 MONTHS WITH DR [name]. You will receive a reminder letter in the mail two months in advance. If you don't receive a letter, please call our office to schedule the follow-up appointment.

## 2015-07-25 NOTE — Progress Notes (Signed)
PCP: [name], NP  Clinic Note: Chief Complaint  Patient presents with  . Annual Exam  . Chest Pain    A FEW MONTHS AGO BUT IT WENT AWAY  . Coronary Artery Disease    HPI: Eric Savage is a 49 y.o. male with a PMH below who presents today for delayed annual f/u for CAD-PCI.  He was originally scheduled for after he visits during the summer that he was not able stay for due to delay in being seen.  LUCIANO CINQUEMANI was last seen in July 2015 - He qualified to get the job with the city of [place] he was looking forward to getting, and has reestablished a relationship with his long estranged wife. He seemed to be quite happy with how things are going.  He continues to be working on a stable relationship and is enjoying his job.  Recent Hospitalizations: none  Studies Reviewed: none  Interval History: He is very active with his job and is not having any symptoms of chest tightness or pressure. About 4 months ago, he had an spell where he was having of some twinging-type chest pain. These episodes would last about 1-2 minutes. He is not having more symptoms since then. These episodes were not necessary associated with exertion and was not similar to his prior anginal symptoms.  He does note that he is out of his lisinopril for about a month now. Denies any headaches or vision associated with this. Otherwise: No chest pain or shortness of breath with rest or exertion.  No PND, orthopnea or edema.  No palpitations, lightheadedness, dizziness, weakness or syncope/near syncope. No TIA/amaurosis fugax symptoms. No claudication.  ROS: A comprehensive was performed. Review of Systems  Constitutional: Negative for malaise/fatigue.  HENT: Negative for nosebleeds.   Cardiovascular: Negative for claudication.  Gastrointestinal: Negative for heartburn, blood in stool and melena.  Genitourinary: Negative for hematuria.  Musculoskeletal: Positive for back pain (worse with lying  down).  Neurological: Negative for dizziness, weakness and headaches.  Endo/Heme/Allergies: Does not bruise/bleed easily.  All other systems reviewed and are negative.   Past Medical History  Diagnosis Date  . Acid reflux   . Hypertension   . CAD S/P percutaneous coronary angioplasty 12/10    RCA/CFX DES  . High cholesterol   . Chest pain 01/2013    Evaluate cardiac catheterization, no obstructive CAD    Past Surgical History  Procedure Laterality Date  . Tonsillectomy  ~ 1973  . Coronary angioplasty with stent placement  09/2009    Proximal RCA - Cypher DES 3.0 mm but 33 mm postdilated to 2.7 mm; proximal CFX XIENCE V. DES 2.75 mm x 15 mm postdilated to 3.0 mm  . Cardiac catheterization  05/2012; 01/28/2013    No significant in-stent restenosis in circumflex or RCA. 60-70% moderate D2 lesion; EF 60-65%.  . Nm myocar perf wall motion  01/04/2011    Protocol:Bruce, post stress EF=69%, Exercise Cap 13 METS, attenuation defect in inferior region of myocardium, no sig. ischemia demonstrated  . Transthoracic echocardiogram  05/13/2009    EF =>55%, mild mitral regurg, trace tricuspid regurg.  . Left heart catheterization with coronary angiogram N/A 05/19/2012    Procedure: LEFT HEART CATHETERIZATION WITH CORONARY ANGIOGRAM;  Surgeon: Marykay Lex, MD;  Location: [facility] CATH LAB;  Service: Cardiovascular;  Laterality: N/A;  . Left heart catheterization with coronary angiogram N/A 01/28/2013    Procedure: LEFT HEART CATHETERIZATION WITH CORONARY ANGIOGRAM;  Surgeon: Lennette Bihari,  MD;  Location: [facility] CATH LAB;  Service: Cardiovascular;  Laterality: N/A;   Prior to Admission medications   Medication Sig Start Date End Date Taking? Authorizing Provider  aspirin 325 MG tablet Take 325 mg by mouth daily.    Historical Provider, MD  Esomeprazole Magnesium (NEXIUM PO) Take 1 tablet by mouth daily. OTC    Historical Provider, MD  isosorbide mononitrate (IMDUR) 30 MG 24 hr tablet Take 0.5 tablets (15  mg total) by mouth daily. 05/21/14   Marykay Lexavid W Hiral Lukasiewicz, MD  lisinopril (PRINIVIL,ZESTRIL) 20 MG tablet TAKE 1 TABLET (20 MG TOTAL) BY MOUTH DAILY. 05/06/15   Marykay Lexavid W Regis Hinton, MD  nitroGLYCERIN (NITROSTAT) 0.4 MG SL tablet Place 1 tablet (0.4 mg total) under the tongue every 5 (five) minutes as needed for chest pain.  04/19/15  Marykay Lexavid W Deyjah Kindel, MD  simvastatin (ZOCOR) 20 MG tablet TAKE 1 TABLET (20 MG TOTAL) BY MOUTH EVERY EVENING. 01/21/15   Marykay Lexavid W Jon Lall, MD   No Known Allergies  Social History   Social History  . Marital Status: Single    Spouse Name: N/A  . Number of Children: N/A  . Years of Education: N/A   Occupational History  . Unemployed    Social History Main Topics  . Smoking status: Current Every Day Smoker -- 0.25 packs/day for 30 years    Types: Cigarettes  . Smokeless tobacco: None  . Alcohol Use: 2.4 oz/week    4 Shots of liquor per week     Comment: 01/28/2013 "takes me a month to drink 1/5th"  . Drug Use: No     Comment: 01/28/2013 last drug use ">10 yr ago"; had reported marijuana use several times a week  . Sexual Activity: Not Currently   Other Topics Concern  . 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   Is active, but with a re-established spousal relationship, is eating a lot more "good - home cooked meals."   Family History  Problem Relation Age of Onset  . CAD Father 10162    CABG     Wt Readings from Last 3 Encounters:  07/25/15 192 lb (87.091 kg)  04/19/14 192 lb 1.6 oz (87.136 kg)  04/07/13 172 lb 11.2 oz (78.336 kg)    PHYSICAL EXAM BP 142/92 mmHg  Pulse 64  Ht 5\' 3"  (1.6 m)  Wt 192 lb (87.091 kg)  BMI 34.02 kg/m2 General appearance: alert, cooperative, appears stated age, no distress and Pleasant mood and affect. Well-nourished and well-groomed. Answers questions appropriately.  HEENT: [place]/AT, EOMI, MMM,  anicteric sclera Neck: no carotid bruit, no JVD and supple, symmetrical, trachea midline  Lungs: CTAB, normal percussion bilaterally and Nonlabored. Good air movement. No W./R./R.  Heart: RRR, normal S1 & S2; No M/R/G; normal apical impulse  Abdomen: soft, non-tender; bowel sounds normal; no masses, no organomegaly  Extremities: extremities normal, atraumatic, no cyanosis or edema,Pulses: 2+ and symmetric  Neurologic: Grossly normal    Adult ECG Report  Rate: 64 ;  Rhythm: normal sinus rhythm and Normal axis, intervals and durations;   Narrative Interpretation: Normal/stable EKG   Other studies Reviewed: Additional studies/ records that were reviewed today include:  Recent Labs:  Checked by PCP - no lipids since 01/2014.   February 2016: Sodium 141, potassium 4.2, chloride 101, bicarbonate 26, BUN 17, creatinine 0.84, glucose 99, calcium 9.2.  T protein 6.3, abdomen 4.7, T bili 0.5, alkaline phosphatase 91, AST/ALT 13/23  CBC: W 9.0, H/H 15/42.9, P 230   ASSESSMENT / PLAN: Problem List Items Addressed This Visit    Tobacco abuse counseling    Smoking cessation instruction/counseling given:  counseled patient on the dangers of tobacco use, advised patient to stop smoking, and reviewed strategies to maximize success (<3 min) He is not beyond the contemplative state.      Relevant Orders   Lipid panel   Comprehensive metabolic panel   Obesity (BMI 19-14.7) (Chronic)    No additional weight gain. The patient understands the need to lose weight with diet and exercise. We have discussed specific strategies for this.      Relevant Orders   Lipid panel   Comprehensive metabolic panel   Non-compliance with treatment    No longer an issue. He had not been on ACE inhibitor is more a factor that he was not able to be seen in a timely manner in order to get his prescription refilled. I did stress to the importance of actually calling in to have a prescription refilled prior to running  out.      Relevant Orders   Lipid panel   Comprehensive metabolic panel   Essential hypertension (Chronic)    Borderline pressures without being on lisinopril currently. Plan to restart ACE inhibitor.      Relevant Medications   lisinopril (PRINIVIL,ZESTRIL) 20 MG tablet   Other Relevant Orders   EKG 12-Lead   Lipid panel   Comprehensive metabolic panel   Dyslipidemia, goal LDL below 70 (Chronic)    On simvastatin. No labs checked since April 2015 that I can find.  Plan: Check FLP and CMP.      Relevant Medications   lisinopril (PRINIVIL,ZESTRIL) 20 MG tablet   Other Relevant Orders   Lipid panel   Comprehensive metabolic panel   CAD, RCA/CFX DES 12/10 with residual 50-60% LAD.- Cath 05/19/12- and 01/28/13 no ISR- med Rx - Primary (Chronic)    No real active symptoms. The episodes he had about a month or so ago aren't probably not cardiac in nature. More musculoskeletal. He is very active without any exertional symptoms over the last several months. Remains on statin and ACE inhibitor as well as aspirin. Can reduce aspirin to 81 mg. Needed refill ACE inhibitor. He is off Imdur.      Relevant Medications   lisinopril (PRINIVIL,ZESTRIL) 20 MG tablet   Other Relevant Orders   EKG 12-Lead   Lipid panel   Comprehensive metabolic panel      Current medicines are reviewed at length with the patient today. (+/- concerns) is out of lisinopril The following changes have been made: Refilled lisinopril Studies Ordered:   Orders Placed This Encounter  Procedures  . Lipid panel  . Comprehensive metabolic panel  . EKG 12-Lead     Follow-up in 12 MONTHS WITH DR Mizael Sagar.    Marykay Lex, M.D., M.S. Interventional Cardiologist   Pager # [phone]

## 2015-07-26 ENCOUNTER — Encounter: Payer: Self-pay | Admitting: Cardiology

## 2015-07-26 NOTE — Assessment & Plan Note (Signed)
Smoking cessation instruction/counseling given:  counseled patient on the dangers of tobacco use, advised patient to stop smoking, and reviewed strategies to maximize success (<3 min) He is not beyond the contemplative state.

## 2015-07-26 NOTE — Assessment & Plan Note (Signed)
Borderline pressures without being on lisinopril currently. Plan to restart ACE inhibitor.

## 2015-07-26 NOTE — Assessment & Plan Note (Signed)
No longer an issue. He had not been on ACE inhibitor is more a factor that he was not able to be seen in a timely manner in order to get his prescription refilled. I did stress to the importance of actually calling in to have a prescription refilled prior to running out.

## 2015-07-26 NOTE — Assessment & Plan Note (Signed)
No additional weight gain. The patient understands the need to lose weight with diet and exercise. We have discussed specific strategies for this.

## 2015-07-26 NOTE — Assessment & Plan Note (Signed)
No real active symptoms. The episodes he had about a month or so ago aren't probably not cardiac in nature. More musculoskeletal. He is very active without any exertional symptoms over the last several months. Remains on statin and ACE inhibitor as well as aspirin. Can reduce aspirin to 81 mg. Needed refill ACE inhibitor. He is off Imdur.

## 2015-07-26 NOTE — Assessment & Plan Note (Signed)
On simvastatin. No labs checked since April 2015 that I can find.  Plan: Check FLP and CMP.

## 2016-06-12 ENCOUNTER — Telehealth: Payer: Self-pay | Admitting: Cardiology

## 2016-06-12 NOTE — Telephone Encounter (Signed)
New message    Pt is at the pcp and a EKG was done   Pt c/o of Chest Pain: STAT if CP now or developed within 24 hours  1. Are you having CP right now? yes  2. Are you experiencing any other symptoms (ex. SOB, nausea, vomiting, sweating)? no  3. How long have you been experiencing CP? 06-11-2016  4. Is your CP continuous or coming and going? Coming and going  5. Have you taken Nitroglycerin? no ?

## 2016-06-12 NOTE — Telephone Encounter (Signed)
Left msg for Bethann Berkshirerisha, cardio cardmaster to call if questions.

## 2016-06-12 NOTE — Telephone Encounter (Signed)
Pt of Dr. Herbie BaltimoreHarding. Called, noting he is due for annual OV.  Returned call. Patient states he has been having chest pain for approx 1 day, started yesterday and "really hasn't let up". I asked if this was a continuous pain and he states yes. Notes he has not taken Nitro for relief - I advised this as 1st line intervention but he notes his bottle is expired.  Recommended w/ his symptoms he seek medical attention in the emergency room. Pt verbalized understanding. [facility] to [facility].

## 2016-07-03 ENCOUNTER — Encounter: Payer: Self-pay | Admitting: Nurse Practitioner

## 2016-07-03 ENCOUNTER — Other Ambulatory Visit: Payer: Self-pay | Admitting: Gastroenterology

## 2016-07-03 DIAGNOSIS — R1032 Left lower quadrant pain: Secondary | ICD-10-CM

## 2016-07-12 ENCOUNTER — Ambulatory Visit: Payer: Commercial Managed Care - HMO | Admitting: Nurse Practitioner

## 2016-07-13 ENCOUNTER — Ambulatory Visit
Admission: RE | Admit: 2016-07-13 | Discharge: 2016-07-13 | Disposition: A | Discharge status: Home / Self Care | Payer: Commercial Managed Care - HMO | Source: Ambulatory Visit | Attending: Gastroenterology | Admitting: Gastroenterology

## 2016-07-13 DIAGNOSIS — R1032 Left lower quadrant pain: Secondary | ICD-10-CM

## 2016-08-06 ENCOUNTER — Other Ambulatory Visit: Payer: Self-pay | Admitting: Gastroenterology

## 2016-08-06 ENCOUNTER — Ambulatory Visit
Admission: RE | Admit: 2016-08-06 | Discharge: 2016-08-06 | Disposition: A | Discharge status: Home / Self Care | Payer: Commercial Managed Care - HMO | Source: Ambulatory Visit | Attending: Gastroenterology | Admitting: Gastroenterology

## 2016-08-06 DIAGNOSIS — R1032 Left lower quadrant pain: Secondary | ICD-10-CM

## 2016-08-06 MED ORDER — IOPAMIDOL (ISOVUE-300) INJECTION 61%
100.0000 mL | Freq: Once | INTRAVENOUS | Status: AC | PRN
  Administered 2016-08-06: 100 mL via INTRAVENOUS

## 2016-08-16 ENCOUNTER — Other Ambulatory Visit: Payer: Self-pay | Admitting: Cardiology

## 2016-08-24 ENCOUNTER — Other Ambulatory Visit: Payer: Self-pay | Admitting: Nurse Practitioner

## 2016-08-24 ENCOUNTER — Ambulatory Visit
Admission: RE | Admit: 2016-08-24 | Discharge: 2016-08-24 | Disposition: A | Discharge status: Home / Self Care | Payer: Worker's Compensation | Source: Ambulatory Visit | Attending: Nurse Practitioner | Admitting: Nurse Practitioner

## 2016-08-24 DIAGNOSIS — M25551 Pain in right hip: Secondary | ICD-10-CM

## 2016-09-08 ENCOUNTER — Encounter (HOSPITAL_COMMUNITY): Payer: Self-pay | Admitting: *Deleted

## 2016-09-08 ENCOUNTER — Emergency Department (HOSPITAL_COMMUNITY): Payer: Commercial Managed Care - HMO

## 2016-09-08 ENCOUNTER — Inpatient Hospital Stay (HOSPITAL_COMMUNITY)
Admission: EM | Admit: 2016-09-08 | Discharge: 2016-09-14 | DRG: 392 | Disposition: A | Discharge status: Home / Self Care | Payer: Commercial Managed Care - HMO | Attending: Internal Medicine | Admitting: Internal Medicine

## 2016-09-08 DIAGNOSIS — F17200 Nicotine dependence, unspecified, uncomplicated: Secondary | ICD-10-CM | POA: Diagnosis present

## 2016-09-08 DIAGNOSIS — Z6828 Body mass index (BMI) 28.0-28.9, adult: Secondary | ICD-10-CM

## 2016-09-08 DIAGNOSIS — E7800 Pure hypercholesterolemia, unspecified: Secondary | ICD-10-CM | POA: Diagnosis present

## 2016-09-08 DIAGNOSIS — K219 Gastro-esophageal reflux disease without esophagitis: Secondary | ICD-10-CM | POA: Diagnosis present

## 2016-09-08 DIAGNOSIS — R7302 Impaired glucose tolerance (oral): Secondary | ICD-10-CM | POA: Diagnosis present

## 2016-09-08 DIAGNOSIS — I10 Essential (primary) hypertension: Secondary | ICD-10-CM | POA: Diagnosis present

## 2016-09-08 DIAGNOSIS — K5792 Diverticulitis of intestine, part unspecified, without perforation or abscess without bleeding: Secondary | ICD-10-CM | POA: Diagnosis present

## 2016-09-08 DIAGNOSIS — E785 Hyperlipidemia, unspecified: Secondary | ICD-10-CM | POA: Diagnosis present

## 2016-09-08 DIAGNOSIS — I2510 Atherosclerotic heart disease of native coronary artery without angina pectoris: Secondary | ICD-10-CM

## 2016-09-08 DIAGNOSIS — K5903 Drug induced constipation: Secondary | ICD-10-CM | POA: Diagnosis present

## 2016-09-08 DIAGNOSIS — T402X5A Adverse effect of other opioids, initial encounter: Secondary | ICD-10-CM | POA: Diagnosis present

## 2016-09-08 DIAGNOSIS — Z79899 Other long term (current) drug therapy: Secondary | ICD-10-CM | POA: Diagnosis not present

## 2016-09-08 DIAGNOSIS — F17213 Nicotine dependence, cigarettes, with withdrawal: Secondary | ICD-10-CM | POA: Diagnosis present

## 2016-09-08 DIAGNOSIS — E669 Obesity, unspecified: Secondary | ICD-10-CM | POA: Diagnosis present

## 2016-09-08 DIAGNOSIS — K5732 Diverticulitis of large intestine without perforation or abscess without bleeding: Secondary | ICD-10-CM

## 2016-09-08 DIAGNOSIS — Z7982 Long term (current) use of aspirin: Secondary | ICD-10-CM

## 2016-09-08 DIAGNOSIS — Z955 Presence of coronary angioplasty implant and graft: Secondary | ICD-10-CM | POA: Diagnosis not present

## 2016-09-08 DIAGNOSIS — Z8249 Family history of ischemic heart disease and other diseases of the circulatory system: Secondary | ICD-10-CM

## 2016-09-08 HISTORY — DX: Diverticulitis of intestine, part unspecified, without perforation or abscess without bleeding: K57.92

## 2016-09-08 LAB — URINALYSIS, ROUTINE W REFLEX MICROSCOPIC
Bilirubin Urine: NEGATIVE
Glucose, UA: NEGATIVE mg/dL
Hgb urine dipstick: NEGATIVE
Ketones, ur: NEGATIVE mg/dL
Leukocytes, UA: NEGATIVE
Nitrite: NEGATIVE
Protein, ur: NEGATIVE mg/dL
Specific Gravity, Urine: 1.023 (ref 1.005–1.030)
pH: 5 (ref 5.0–8.0)

## 2016-09-08 LAB — CBC
HCT: 43.9 % (ref 39.0–52.0)
Hemoglobin: 14.8 g/dL (ref 13.0–17.0)
MCH: 30.1 pg (ref 26.0–34.0)
MCHC: 33.7 g/dL (ref 30.0–36.0)
MCV: 89.4 fL (ref 78.0–100.0)
Platelets: 283 10*3/uL (ref 150–400)
RBC: 4.91 MIL/uL (ref 4.22–5.81)
RDW: 11.9 % (ref 11.5–15.5)
WBC: 16.7 10*3/uL — ABNORMAL HIGH (ref 4.0–10.5)

## 2016-09-08 LAB — COMPREHENSIVE METABOLIC PANEL
ALT: 24 U/L (ref 17–63)
AST: 18 U/L (ref 15–41)
Albumin: 3.8 g/dL (ref 3.5–5.0)
Alkaline Phosphatase: 81 U/L (ref 38–126)
Anion gap: 9 (ref 5–15)
BUN: 15 mg/dL (ref 6–20)
CO2: 25 mmol/L (ref 22–32)
Calcium: 9.2 mg/dL (ref 8.9–10.3)
Chloride: 99 mmol/L — ABNORMAL LOW (ref 101–111)
Creatinine, Ser: 1.06 mg/dL (ref 0.61–1.24)
GFR calc Af Amer: >60 mL/min (ref >60)
GFR calc non Af Amer: >60 mL/min (ref >60)
Glucose, Bld: 126 mg/dL — ABNORMAL HIGH (ref 65–99)
Potassium: 4.6 mmol/L (ref 3.5–5.1)
Sodium: 133 mmol/L — ABNORMAL LOW (ref 135–145)
Total Bilirubin: 0.6 mg/dL (ref 0.3–1.2)
Total Protein: 6.1 g/dL — ABNORMAL LOW (ref 6.5–8.1)

## 2016-09-08 LAB — LIPASE, BLOOD: Lipase: 15 U/L (ref 11–51)

## 2016-09-08 MED ORDER — MORPHINE SULFATE (PF) 4 MG/ML IV SOLN
4.0000 mg | Freq: Once | INTRAVENOUS | Status: AC
  Administered 2016-09-08: 4 mg via INTRAVENOUS
  Filled 2016-09-08: qty 1

## 2016-09-08 MED ORDER — IOPAMIDOL (ISOVUE-300) INJECTION 61%
INTRAVENOUS | Status: AC
  Administered 2016-09-08: 100 mL
  Filled 2016-09-08: qty 100

## 2016-09-08 MED ORDER — SODIUM CHLORIDE 0.9 % IV SOLN
INTRAVENOUS | Status: DC
  Administered 2016-09-08 – 2016-09-11 (×4): via INTRAVENOUS

## 2016-09-08 MED ORDER — BISACODYL 10 MG RE SUPP: 10.0000 mg | Freq: Every day | RECTAL | Status: DC | PRN

## 2016-09-08 MED ORDER — HYDROCODONE-ACETAMINOPHEN 5-325 MG PO TABS
1.0000 | ORAL_TABLET | ORAL | Status: DC | PRN
  Administered 2016-09-08 – 2016-09-09 (×5): 2 via ORAL
  Filled 2016-09-08 (×5): qty 2

## 2016-09-08 MED ORDER — ONDANSETRON HCL 4 MG/2ML IJ SOLN
4.0000 mg | Freq: Once | INTRAMUSCULAR | Status: AC
  Administered 2016-09-08: 4 mg via INTRAVENOUS
  Filled 2016-09-08: qty 2

## 2016-09-08 MED ORDER — ONDANSETRON HCL 4 MG/2ML IJ SOLN: 4.0000 mg | Freq: Four times a day (QID) | INTRAMUSCULAR | Status: DC | PRN

## 2016-09-08 MED ORDER — KETOROLAC TROMETHAMINE 30 MG/ML IJ SOLN
30.0000 mg | Freq: Four times a day (QID) | INTRAMUSCULAR | Status: AC | PRN
Start: 2016-09-08 — End: 2016-09-13
  Administered 2016-09-08 – 2016-09-13 (×8): 30 mg via INTRAVENOUS
  Filled 2016-09-08 (×9): qty 1

## 2016-09-08 MED ORDER — TEMAZEPAM 15 MG PO CAPS
15.0000 mg | ORAL_CAPSULE | Freq: Every evening | ORAL | Status: AC | PRN
  Administered 2016-09-09: 15 mg via ORAL
  Filled 2016-09-08: qty 1

## 2016-09-08 MED ORDER — TRAZODONE HCL 50 MG PO TABS
25.0000 mg | ORAL_TABLET | Freq: Every evening | ORAL | Status: DC | PRN
  Administered 2016-09-08: 25 mg via ORAL
  Filled 2016-09-08: qty 1

## 2016-09-08 MED ORDER — PANTOPRAZOLE SODIUM 40 MG IV SOLR
40.0000 mg | INTRAVENOUS | Status: DC
  Administered 2016-09-08 – 2016-09-10 (×3): 40 mg via INTRAVENOUS
  Filled 2016-09-08 (×3): qty 40

## 2016-09-08 MED ORDER — MAGNESIUM CITRATE PO SOLN: 1.0000 | Freq: Once | ORAL | Status: DC | PRN

## 2016-09-08 MED ORDER — PIPERACILLIN-TAZOBACTAM 3.375 G IVPB
3.3750 g | Freq: Three times a day (TID) | INTRAVENOUS | Status: DC
  Administered 2016-09-08 – 2016-09-12 (×12): 3.375 g via INTRAVENOUS
  Filled 2016-09-08 (×14): qty 50

## 2016-09-08 MED ORDER — ONDANSETRON HCL 4 MG PO TABS
4.0000 mg | ORAL_TABLET | Freq: Four times a day (QID) | ORAL | Status: DC | PRN
  Administered 2016-09-11: 4 mg via ORAL
  Filled 2016-09-08: qty 1

## 2016-09-08 MED ORDER — PIPERACILLIN-TAZOBACTAM 3.375 G IVPB 30 MIN: 3.3750 g | Freq: Once | INTRAVENOUS | Status: AC

## 2016-09-08 MED ORDER — SIMVASTATIN 20 MG PO TABS
20.0000 mg | ORAL_TABLET | Freq: Every day | ORAL | Status: DC
Start: 2016-09-08 — End: 2016-09-14
  Administered 2016-09-08 – 2016-09-13 (×6): 20 mg via ORAL
  Filled 2016-09-08 (×6): qty 1

## 2016-09-08 MED ORDER — LISINOPRIL 20 MG PO TABS
20.0000 mg | ORAL_TABLET | Freq: Every day | ORAL | Status: DC
  Administered 2016-09-08 – 2016-09-13 (×6): 20 mg via ORAL
  Filled 2016-09-08 (×6): qty 1

## 2016-09-08 MED ORDER — ZOLPIDEM TARTRATE 5 MG PO TABS: 10.0000 mg | ORAL_TABLET | Freq: Every evening | ORAL | Status: DC | PRN

## 2016-09-08 MED ORDER — PIPERACILLIN-TAZOBACTAM 3.375 G IVPB 30 MIN
3.3750 g | Freq: Once | INTRAVENOUS | Status: AC
  Administered 2016-09-08: 3.375 g via INTRAVENOUS
  Filled 2016-09-08: qty 50

## 2016-09-08 MED ORDER — ASPIRIN 81 MG PO CHEW
81.0000 mg | CHEWABLE_TABLET | Freq: Every day | ORAL | Status: DC
  Administered 2016-09-08 – 2016-09-13 (×6): 81 mg via ORAL
  Filled 2016-09-08 (×6): qty 1

## 2016-09-08 MED ORDER — ENOXAPARIN SODIUM 40 MG/0.4ML ~~LOC~~ SOLN
40.0000 mg | SUBCUTANEOUS | Status: DC
  Administered 2016-09-08: 40 mg via SUBCUTANEOUS
  Filled 2016-09-08 (×4): qty 0.4

## 2016-09-08 MED ORDER — SENNOSIDES-DOCUSATE SODIUM 8.6-50 MG PO TABS
1.0000 | ORAL_TABLET | Freq: Every evening | ORAL | Status: DC | PRN
  Administered 2016-09-09 – 2016-09-11 (×3): 1 via ORAL
  Filled 2016-09-08 (×3): qty 1

## 2016-09-08 MED ORDER — ACETAMINOPHEN 650 MG RE SUPP: 650.0000 mg | Freq: Four times a day (QID) | RECTAL | Status: DC | PRN

## 2016-09-08 MED ORDER — ACETAMINOPHEN 325 MG PO TABS
650.0000 mg | ORAL_TABLET | Freq: Four times a day (QID) | ORAL | Status: DC | PRN
Start: 2016-09-08 — End: 2016-09-09

## 2016-09-08 MED ORDER — NICOTINE 21 MG/24HR TD PT24
21.0000 mg | MEDICATED_PATCH | Freq: Every day | TRANSDERMAL | Status: DC
  Administered 2016-09-13: 21 mg via TRANSDERMAL
  Filled 2016-09-08 (×5): qty 1

## 2016-09-08 NOTE — ED Provider Notes (Signed)
[facility] DEPT Provider Note   CSN: [id number] Arrival date & time: 09/08/16  [id number]     History   Chief Complaint Chief Complaint  Patient presents with  . Abdominal Pain    HPI Santiago BurDonald J Bassette is a 50 y.o. male.  50yo M w/ PMH including diverticulitis followed by GI, CAD s/p stenting, HTN, HLD, GERD who p/w abdominal pain. Pt has had a flare-up of lower abdominal pain lasting 3 minutes every other week for the past 10 weeks. Pain occurs just prior to urination. He saw GI last Tuesday and they drew labs which were reassuring. He has been on cipro/flagyl chronically since Sept. He is scheduled for colonoscopy this month.   Subjective fevers/chills as well as nausea and vomiting. He has chronic diarrhea. No blood in stool, no hematuria.   No CP, SOB, syncope.    The history is provided by the patient.  Abdominal Pain      Past Medical History:  Diagnosis Date  . Acid reflux   . CAD S/P percutaneous coronary angioplasty 12/10   RCA/CFX DES  . Chest pain 01/2013   Evaluate cardiac catheterization, no obstructive CAD  . High cholesterol   . Hypertension     Patient Active Problem List   Diagnosis Date Noted  . Obesity (BMI 30-39.9) 04/26/2014  . Tobacco abuse counseling 04/12/2013  . Non-compliance with treatment 01/28/2013  . Essential hypertension 05/16/2012  . Dyslipidemia, goal LDL below 70 05/16/2012  . Smoker 05/16/2012  . CAD, RCA/CFX DES 12/10 with residual 50-60% LAD.- Cath 05/19/12- and 01/28/13 no ISR- med Rx 09/15/2009    Past Surgical History:  Procedure Laterality Date  . CARDIAC CATHETERIZATION  05/2012; 01/28/2013   No significant in-stent restenosis in circumflex or RCA. 60-70% moderate D2 lesion; EF 60-65%.  . CORONARY ANGIOPLASTY WITH STENT PLACEMENT  09/2009   Proximal RCA - Cypher DES 3.0 mm but 33 mm postdilated to 2.7 mm; proximal CFX XIENCE V. DES 2.75 mm x 15 mm postdilated to 3.0 mm  . LEFT HEART CATHETERIZATION WITH CORONARY ANGIOGRAM  N/A 05/19/2012   Procedure: LEFT HEART CATHETERIZATION WITH CORONARY ANGIOGRAM;  Surgeon: Marykay Lexavid W [name], MD;  [facility];  Service: Cardiovascular;  Laterality: N/A;  . LEFT HEART CATHETERIZATION WITH CORONARY ANGIOGRAM N/A 01/28/2013   Procedure: LEFT HEART CATHETERIZATION WITH CORONARY ANGIOGRAM;  Surgeon: Lennette Biharihomas A Kelly, MD;  [facility];  Service: Cardiovascular;  Laterality: N/A;  . NM MYOCAR PERF WALL MOTION  01/04/2011   Protocol:Bruce, post stress EF=69%, Exercise Cap 13 METS, attenuation defect in inferior region of myocardium, no sig. ischemia demonstrated  . TONSILLECTOMY  ~ 1973  . TRANSTHORACIC ECHOCARDIOGRAM  05/13/2009   EF =>55%, mild mitral regurg, trace tricuspid regurg.       Home Medications    Prior to Admission medications   Medication Sig Start Date End Date Taking? Authorizing Provider  aspirin 81 MG tablet Take 81 mg by mouth daily.    Yes Historical Provider, MD  ciprofloxacin (CIPRO) 500 MG tablet Take 500 mg by mouth 2 (two) times daily. 07/09/16  Yes Historical Provider, MD  lisinopril (PRINIVIL,ZESTRIL) 20 MG tablet TAKE 1 TABLET (20 MG TOTAL) BY MOUTH DAILY. 08/16/16  Yes Marykay Lexavid W [name], MD  metroNIDAZOLE (FLAGYL) 250 MG tablet Take 250 mg by mouth 3 (three) times daily. 08/06/16  Yes Historical Provider, MD  simvastatin (ZOCOR) 20 MG tablet TAKE 1 TABLET (20 MG TOTAL) BY MOUTH EVERY EVENING. 08/16/16  Yes Piedad Climesavid W  Herbie BaltimoreHarding, MD  nitroGLYCERIN (NITROSTAT) 0.4 MG SL tablet Place 1 tablet (0.4 mg total) under the tongue every 5 (five) minutes as needed for chest pain.  04/19/15  Marykay Lexavid W [name], MD    Family History Family History  Problem Relation Age of Onset  . CAD Father 3762    CABG    Social History Social History  Substance Use Topics  . Smoking status: Current Every Day Smoker    Packs/day: 0.25    Years: 30.00    Types: Cigarettes  . Smokeless tobacco: Never Used  . Alcohol use 2.4 oz/week    4 Shots of liquor per week      Comment: 01/28/2013 "takes me a month to drink 1/5th"     Allergies   Patient has no known allergies.   Review of Systems Review of Systems  Gastrointestinal: Positive for abdominal pain.   10 Systems reviewed and are negative for acute change except as noted in the HPI.   Physical Exam Updated Vital Signs BP 122/76   Pulse 69   Temp 98.7 F (37.1 C) (Oral)   Resp 18   SpO2 98%   Physical Exam  Constitutional: He is oriented to person, place, and time. He appears well-developed and well-nourished. No distress.  HENT:  Head: Normocephalic and atraumatic.  Moist mucous membranes  Eyes: Conjunctivae are normal. Pupils are equal, round, and reactive to light.  Neck: Neck supple.  Cardiovascular: Normal rate, regular rhythm and normal heart sounds.   No murmur heard. Pulmonary/Chest: Effort normal and breath sounds normal.  Abdominal: Soft. Bowel sounds are normal. He exhibits no distension. There is tenderness. There is no rebound and no guarding.  Right lower quadrant, suprapubic, and left lower quadrant tenderness to palpation  Musculoskeletal: He exhibits no edema.  Neurological: He is alert and oriented to person, place, and time.  Fluent speech  Skin: Skin is warm and dry.  Psychiatric: He has a normal mood and affect. Judgment normal.  Nursing note and vitals reviewed.    ED Treatments / Results  Labs (all labs ordered are listed, but only abnormal results are displayed) Labs Reviewed  COMPREHENSIVE METABOLIC PANEL - Abnormal; Notable for the following:       Result Value   Sodium 133 (*)    Chloride 99 (*)    Glucose, Bld 126 (*)    Total Protein 6.1 (*)    All other components within normal limits  CBC - Abnormal; Notable for the following:    WBC 16.7 (*)    All other components within normal limits  LIPASE, BLOOD  URINALYSIS, ROUTINE W REFLEX MICROSCOPIC (NOT AT [facility])    EKG  EKG Interpretation None       Radiology X-ray Chest Pa And  Lateral  Result Date: 09/09/2016 CLINICAL DATA:  Lower abdominal pain. EXAM: CHEST  2 VIEW COMPARISON:  January 27, 2013 FINDINGS: The heart size and mediastinal contours are within normal limits. Both lungs are clear. The visualized skeletal structures are unremarkable. IMPRESSION: No active cardiopulmonary disease. Electronically Signed   By: Gerome Samavid  Williams III M.D   On: 09/09/2016 09:11   Ct Abdomen Pelvis W Contrast  Result Date: 09/08/2016 CLINICAL DATA:  50 year old male with history of lower abdominal pain and leukocytosis on antibiotics. Possible recurrent diverticulitis. Some emesis this morning. EXAM: CT ABDOMEN AND PELVIS WITH CONTRAST TECHNIQUE: Multidetector CT imaging of the abdomen and pelvis was performed using the standard protocol following bolus administration of intravenous contrast. CONTRAST:  ISOVUE-300 IOPAMIDOL (ISOVUE-300) INJECTION 61% COMPARISON:  CT the abdomen and pelvis 08/06/2016. FINDINGS: Lower chest: Atherosclerotic calcifications in the right coronary artery. Hepatobiliary: Hypervascular lesions are again noted in segment 4A and 3, measuring up to 2.2 x 2.7 cm in segment 4A (image 11 of series 2) very similar to remote prior examinations dating back to 03/12/2004, most compatible with benign cavernous hemangiomas. No suspicious hepatic lesions are otherwise identified. No intra or extrahepatic biliary ductal dilatation. Gallbladder is normal in appearance. Pancreas: No pancreatic mass. No pancreatic ductal dilatation. No pancreatic or peripancreatic fluid or inflammatory changes. Spleen: Unremarkable. Adrenals/Urinary Tract: 2 low-attenuation lesions in the left kidney measuring up to 2.2 cm in the upper pole are compatible with simple cysts. Right kidney and bilateral adrenal glands are normal in appearance. There is no hydroureteronephrosis or perinephric stranding to indicate urinary tract obstruction at this time. Urinary bladder is normal in appearance.  Stomach/Bowel: Normal appearance of the stomach. There is no pathologic dilatation of small bowel or colon. Numerous colonic diverticulae are noted. Importantly, in the sigmoid colon there is also extensive mural thickening and extensive surrounding inflammatory changes associated with the sigmoid mesocolon, compatible with severe acute diverticulitis. No discrete diverticular abscess is identified. No signs of frank perforation of the bowel. Normal appendix. Vascular/Lymphatic: Aortic atherosclerosis, without evidence of aneurysm or dissection in the abdominal and pelvic vasculature. No lymphadenopathy noted in the abdomen or pelvis. Reproductive: Prostate gland and seminal vesicles are unremarkable in appearance. Other: Trace volume of ascites noted in the low anatomic pelvis, presumably reactive. No larger volume of ascites. No pneumoperitoneum. Musculoskeletal: There are no aggressive appearing lytic or blastic lesions noted in the visualized portions of the skeleton. IMPRESSION: 1. Acute diverticulitis of the sigmoid colon. No discrete diverticular abscess or signs of frank perforation are noted at this time. 2. Normal appendix. 3. 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 4. Two simple cysts in the left kidney are incidentally noted. 5. Aortic atherosclerosis. Electronically Signed   By: Trudie Reed M.D.   On: 09/08/2016 10:58    Procedures Procedures (including critical care time)  Medications Ordered in ED Medications  morphine 4 MG/ML injection 4 mg (4 mg Intravenous Given 09/08/16 0905)  ondansetron (ZOFRAN) injection 4 mg (4 mg Intravenous Given 09/08/16 0905)     Initial Impression / Assessment and Plan / ED Course  I have reviewed the triage vital signs and the nursing notes.  Pertinent labs & imaging results that were available during my care of the patient were reviewed by me and considered in my medical decision making (see chart for details).  Clinical  Course    Patient with known diverticulitis on chronic cipro/flagyl for recurrent diverticulitis who presents with ongoing abdominal pain despite compliance with medication. He was nontoxic on exam with reassuring vital signs, afebrile. He had abdominal tenderness across his lower abdomen without distention, rebound or guarding. His lab work is notable for leukocytosis which is concerning for possible complication of diverticulitis such as abscess or perforation. Therefore, obtained CT of abdomen and pelvis. It shows persistent diverticulitis without any complications. I am concerned about the patient's failure of outpt management with cipro/flagyl as well as his ongoing pain requiring several doses of IV narcotics. Gave Zosyn. Discussed admission w/ hospitalist, Marlowe Kays, appreciate assistance. PT admitted for further treatment.   Final Clinical Impressions(s) / ED Diagnoses   Final diagnoses:  None    New Prescriptions New Prescriptions   No medications on  file     Laurence Spates, MD 09/11/16 [phone]

## 2016-09-08 NOTE — ED Triage Notes (Signed)
The pt is c/o abd pain for  9-10 weeks   He has divertulitis and has been under the care of a gi doctor for the same  C/o pain

## 2016-09-08 NOTE — H&P (Signed)
History and Physical    Eric BurDonald J Luhmann AVW:[id number] DOB: 1965/12/08 DOA: 09/08/2016   PCP: Egbert GaribaldiMillsaps, KIMBERLY M, NP   Patient coming from:  Home    Chief Complaint: abdominal pain, nausea   HPI: Eric Savage is a 50 y.o. male  with a history of diverticulitis followed by G.I., presenting with significant right lower quadrant pain, describes as a flareup of his diverticulitis. The pain last about 3 minutes, with a frequency of every other week, over the last 2 1/2 months. This occurs just prior to urination. The patient has been seen by G.I. last Tuesday, labs were drawn, which were reassuring. He had been on Cipro and Flagyl chronicaly since in September and he is scheduled for colonoscopy later this month. He denies any blood in the stool or hematuria. He denies any chest pain or shortness of breath. He does report subjective fever and chills, nausea and vomiting. He is being admitted for observation, and management of his symptoms.   ED Course:  BP 103/74   Pulse (!) 34   Temp 98.7 F (37.1 C) (Oral)   Resp 16   SpO2 95%    he has received Zosyn  IV at the ER, along with IV fluids. Sodium 133 potassium 4.6  BUN 15 creatinine 1.06  AST 18 ALT 24 total bilirubin 0.6  alkaline phosphatase 81 lipase 15 white count 16.7 hemoglobin 14.8 platelets 283 glucose 126 urine negative for leukocytes, blood or nitrites. CT of the abdomen and pelvis with contrast on  09/08/16 show Acute diverticulitis of the sigmoid colon. No discrete diverticular abscess or signs of frank perforation are noted at thistime. Appendix normal   Review of Systems: As per HPI otherwise 10 point review of systems negative.   Past Medical History:  Diagnosis Date  . Acid reflux   . CAD S/P percutaneous coronary angioplasty 12/10   RCA/CFX DES  . Chest pain 01/2013   Evaluate cardiac catheterization, no obstructive CAD  . High cholesterol   . Hypertension     Past Surgical History:  Procedure Laterality  Date  . CARDIAC CATHETERIZATION  05/2012; 01/28/2013   No significant in-stent restenosis in circumflex or RCA. 60-70% moderate D2 lesion; EF 60-65%.  . CORONARY ANGIOPLASTY WITH STENT PLACEMENT  09/2009   Proximal RCA - Cypher DES 3.0 mm but 33 mm postdilated to 2.7 mm; proximal CFX XIENCE V. DES 2.75 mm x 15 mm postdilated to 3.0 mm  . LEFT HEART CATHETERIZATION WITH CORONARY ANGIOGRAM N/A 05/19/2012   Procedure: LEFT HEART CATHETERIZATION WITH CORONARY ANGIOGRAM;  Surgeon: Marykay Lexavid W [name], MD;  [facility];  Service: Cardiovascular;  Laterality: N/A;  . LEFT HEART CATHETERIZATION WITH CORONARY ANGIOGRAM N/A 01/28/2013   Procedure: LEFT HEART CATHETERIZATION WITH CORONARY ANGIOGRAM;  Surgeon: Lennette Biharihomas A Kelly, MD;  [facility];  Service: Cardiovascular;  Laterality: N/A;  . NM MYOCAR PERF WALL MOTION  01/04/2011   Protocol:Bruce, post stress EF=69%, Exercise Cap 13 METS, attenuation defect in inferior region of myocardium, no sig. ischemia demonstrated  . TONSILLECTOMY  ~ 1973  . TRANSTHORACIC ECHOCARDIOGRAM  05/13/2009   EF =>55%, mild mitral regurg, trace tricuspid regurg.    Social History Social History   Social History  . Marital status: Single    Spouse name: N/A  . Number of children: N/A  . Years of education: N/A   Occupational History  . Unemployed    Social History Main Topics  . Smoking status: Current Every Day Smoker  Packs/day: 0.25    Years: 30.00    Types: Cigarettes  . Smokeless tobacco: Never Used  . Alcohol use 2.4 oz/week    4 Shots of liquor per week     Comment: 01/28/2013 "takes me a month to drink 1/5th"  . Drug use: No     Comment: 01/28/2013 last drug use ">10 yr ago"; had reported marijuana use several times a week  . Sexual activity: Not Currently   Other Topics Concern  . 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   Is active, but with a re-established spousal relationship, is eating a lot more "good - home cooked meals."     No Known Allergies  Family History  Problem Relation Age of Onset  . CAD Father 38    CABG      Prior to Admission medications   Medication Sig Start Date End Date Taking? Authorizing Provider  aspirin 81 MG tablet Take 81 mg by mouth daily.    Yes Historical Provider, MD  ciprofloxacin (CIPRO) 500 MG tablet Take 500 mg by mouth 2 (two) times daily. 07/09/16  Yes Historical Provider, MD  lisinopril (PRINIVIL,ZESTRIL) 20 MG tablet TAKE 1 TABLET (20 MG TOTAL) BY MOUTH DAILY. 08/16/16  Yes Marykay Lex, MD  metroNIDAZOLE (FLAGYL) 250 MG tablet Take 250 mg by mouth 3 (three) times daily. 08/06/16  Yes Historical Provider, MD  simvastatin (ZOCOR) 20 MG tablet TAKE 1 TABLET (20 MG TOTAL) BY MOUTH EVERY EVENING. 08/16/16  Yes Marykay Lex, MD  nitroGLYCERIN (NITROSTAT) 0.4 MG SL tablet Place 1 tablet (0.4 mg total) under the tongue every 5 (five) minutes as needed for chest pain.  04/19/15  Marykay Lex, MD    Physical Exam:    Vitals:   09/08/16 1203 09/08/16 1230 09/08/16 1245 09/08/16 1300  BP: 119/81 112/72 113/73 103/74  Pulse: 69 68 66 (!) 34  Resp:      Temp:      TempSrc:      SpO2: 99% 96% 96% 95%       Constitutional: NAD, calm, comfortable  Vitals:   09/08/16 1203 09/08/16 1230 09/08/16 1245 09/08/16 1300  BP: 119/81 112/72 113/73 103/74  Pulse: 69 68 66 (!) 34  Resp:      Temp:      TempSrc:      SpO2: 99% 96% 96% 95%   Eyes: PERRL, lids and conjunctivae normal ENMT: Mucous membranes are moist. Posterior pharynx clear of any exudate or lesions.Normal dentition.  Neck: normal, supple, no masses, no thyromegaly Respiratory:  Expiratory diffuse, no rhonchi, no crackles. Normal respiratory effort. No accessory muscle use.  Cardiovascular: Regular rate and rhythm, no murmurs / rubs / gallops. No extremity edema. 2+ pedal  pulses. No carotid bruits.  Abdomen:RLQ tenderness  no masses palpated. No hepatosplenomegaly. Bowel sounds positive.  Musculoskeletal: no clubbing / cyanosis. No joint deformity upper and lower extremities. Good ROM, no contractures. Normal muscle tone.  Skin: no rashes, lesions, ulcers.  Neurologic: CN 2-12 grossly intact. Sensation intact, DTR normal. Strength 5/5 in all 4.  Psychiatric: Normal judgment and insight. Alert and oriented x 3. Flat affect     Labs on Admission: I have personally reviewed following labs and imaging studies  CBC:  Recent Labs Lab 09/08/16 0710  WBC 16.7*  HGB 14.8  HCT 43.9  MCV 89.4  PLT 283    Basic Metabolic Panel:  Recent Labs Lab 09/08/16 0710  NA 133*  K 4.6  CL 99*  CO2 25  GLUCOSE 126*  BUN 15  CREATININE 1.06  CALCIUM 9.2    GFR: CrCl cannot be calculated (Unknown ideal weight.).  Liver Function Tests:  Recent Labs Lab 09/08/16 0710  AST 18  ALT 24  ALKPHOS 81  BILITOT 0.6  PROT 6.1*  ALBUMIN 3.8    Recent Labs Lab 09/08/16 0710  LIPASE 15   No results for input(s): AMMONIA in the last 168 hours.  Coagulation Profile: No results for input(s): INR, PROTIME in the last 168 hours.  Cardiac Enzymes: No results for input(s): CKTOTAL, CKMB, CKMBINDEX, TROPONINI in the last 168 hours.  BNP (last 3 results) No results for input(s): PROBNP in the last 8760 hours.  HbA1C: No results for input(s): HGBA1C in the last 72 hours.  CBG: No results for input(s): GLUCAP in the last 168 hours.  Lipid Profile: No results for input(s): CHOL, HDL, LDLCALC, TRIG, CHOLHDL, LDLDIRECT in the last 72 hours.  Thyroid Function Tests: No results for input(s): TSH, T4TOTAL, FREET4, T3FREE, THYROIDAB in the last 72 hours.  Anemia Panel: No results for input(s): VITAMINB12, FOLATE, FERRITIN, TIBC, IRON, RETICCTPCT in the last 72 hours.  Urine analysis:    Component Value Date/Time   COLORURINE YELLOW 09/08/2016 0725    APPEARANCEUR CLEAR 09/08/2016 0725   LABSPEC 1.023 09/08/2016 0725   PHURINE 5.0 09/08/2016 0725   GLUCOSEU NEGATIVE 09/08/2016 0725   HGBUR NEGATIVE 09/08/2016 0725   BILIRUBINUR NEGATIVE 09/08/2016 0725   KETONESUR NEGATIVE 09/08/2016 0725   PROTEINUR NEGATIVE 09/08/2016 0725   UROBILINOGEN 1.0 05/16/2012 1702   NITRITE NEGATIVE 09/08/2016 0725   LEUKOCYTESUR NEGATIVE 09/08/2016 0725    Sepsis Labs: @LABRCNTIP (procalcitonin:4,lacticidven:4) )No results found for this or any previous visit (from the past 240 hour(s)).   Radiological Exams on Admission: Ct Abdomen Pelvis W Contrast  Result Date: 09/08/2016 CLINICAL DATA:  50 year old male with history of lower abdominal pain and leukocytosis on antibiotics. Possible recurrent diverticulitis. Some emesis this morning. EXAM: CT ABDOMEN AND PELVIS WITH CONTRAST TECHNIQUE: Multidetector CT imaging of the abdomen and pelvis was performed using the standard protocol following bolus administration of intravenous contrast. CONTRAST:  100mL ISOVUE-300 IOPAMIDOL (ISOVUE-300) INJECTION 61% COMPARISON:  CT the abdomen and pelvis 08/06/2016. FINDINGS: Lower chest: Atherosclerotic calcifications in the right coronary artery. Hepatobiliary: Hypervascular lesions are again noted in segment 4A and 3, measuring up to 2.2 x 2.7 cm in segment 4A (image 11 of series 2) very similar to remote prior examinations dating back to 03/12/2004, most compatible with benign cavernous hemangiomas. No suspicious hepatic lesions are otherwise identified. No intra or extrahepatic biliary ductal dilatation. Gallbladder is normal in appearance. Pancreas: No pancreatic mass. No pancreatic ductal dilatation. No pancreatic or peripancreatic fluid or inflammatory changes. Spleen: Unremarkable. Adrenals/Urinary Tract: 2 low-attenuation lesions in the left kidney measuring up to 2.2 cm in the upper pole are compatible with simple cysts. Right kidney and bilateral adrenal glands are normal  in appearance. There is no hydroureteronephrosis or perinephric stranding to indicate urinary tract obstruction at this time. Urinary bladder is normal in appearance. Stomach/Bowel: Normal appearance of the stomach. There is no pathologic dilatation of small bowel or colon. Numerous colonic diverticulae are noted. Importantly, in the sigmoid colon there is also extensive mural thickening and extensive surrounding inflammatory changes associated with the sigmoid mesocolon, compatible with  severe acute diverticulitis. No discrete diverticular abscess is identified. No signs of frank perforation of the bowel. Normal appendix. Vascular/Lymphatic: Aortic atherosclerosis, without evidence of aneurysm or dissection in the abdominal and pelvic vasculature. No lymphadenopathy noted in the abdomen or pelvis. Reproductive: Prostate gland and seminal vesicles are unremarkable in appearance. Other: Trace volume of ascites noted in the low anatomic pelvis, presumably reactive. No larger volume of ascites. No pneumoperitoneum. Musculoskeletal: There are no aggressive appearing lytic or blastic lesions noted in the visualized portions of the skeleton. IMPRESSION: 1. Acute diverticulitis of the sigmoid colon. No discrete diverticular abscess or signs of frank perforation are noted at this time. 2. Normal appendix. 3. 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 4. Two simple cysts in the left kidney are incidentally noted. 5. Aortic atherosclerosis. Electronically Signed   By: Trudie Reed M.D.   On: 09/08/2016 10:58    EKG: Independently reviewed.  Assessment/Plan Active Problems:   Diverticulitis   Essential hypertension   Dyslipidemia, goal LDL below 70   Smoker   CAD (coronary artery disease)    Acute  On chronic diverticulitis failing OP treatment with Cipro and Flagyl since Sept 2017  he has received Zosyn  IV at the ER, along with IV fluids.CT of the abdomen and pelvis with contrast on   09/08/16 show Acute diverticulitis of the sigmoid colon. No discrete diverticular abscess or signs of frank perforation are noted at thistime. Appendix normal . WBC 16.7  Admit to MedSurg. Clear liquids Continue IV hydration with normal saline.  Zosyn 3.375 g every 8 hours IVPB. Protonix 40 mg IV every 24 hours. Analgesics and antiemetics as needed. Consider surgical evaluation if no improvement. Repeat CBC in am. Cultures ordered  Hypertension BP 103/74   Pulse (!) 34   Temp 98.7 F (37.1 C) (Oral)   Resp 16   SpO2 95%  Controlled Continue home anti-hypertensive medications   Hyperlipidemia Continue home statins   Tobacco abuse with nicotine withdrawal -  Nicotine patch   -  Counseled cessation  CAD,  EKG without ACS , Last Cath  residual 50-60% LAD 01/28/13   med Rx  patient is cardiac pain free at this time. Continue meds    DVT prophylaxis: Lovenox  Code Status:   Full    Family Communication:  Discussed with patient Disposition Plan: Expect patient to be discharged to home after condition improves Consults called:    None Admission status Medsurg  Obs   Lili Harts E, PA-C [facility]   09/08/2016, 1:08 PM

## 2016-09-08 NOTE — ED Notes (Signed)
Admit Provider at bedside. 

## 2016-09-08 NOTE — Progress Notes (Signed)
Pharmacy Antibiotic Note  Santiago BurDonald J Sprung is a 50 y.o. male admitted on 09/08/2016 with intra-abdominal.  Pharmacy has been consulted for zosyn dosing. 50 yo with a hx diverticulitis who was admitted for diverticulitis again today. He has been on/off abx due to this issue. Empiric zosyn has been ordered.   Plan: Zosyn 3.375g IV q8 F/u with cultures     Temp (24hrs), Avg:98.7 F (37.1 C), Min:98.7 F (37.1 C), Max:98.7 F (37.1 C)   Recent Labs Lab 09/08/16 0710  WBC 16.7*  CREATININE 1.06    CrCl cannot be calculated (Unknown ideal weight.).    No Known Allergies  Antimicrobials this admission: 12/2 zosyn >>  Dose adjustments this admission:   Microbiology results: 12/2 BCx:  12/2 UCx:  Sputum:  MRSA PCR:   Ulyses SouthwardMinh Pham, PharmD, BCPS, AAHIVP, CPP Infectious Disease Pharmacist Pager: [phone] 09/08/2016 2:17 PM

## 2016-09-09 ENCOUNTER — Inpatient Hospital Stay (HOSPITAL_COMMUNITY): Payer: Commercial Managed Care - HMO

## 2016-09-09 DIAGNOSIS — I2510 Atherosclerotic heart disease of native coronary artery without angina pectoris: Secondary | ICD-10-CM

## 2016-09-09 LAB — COMPREHENSIVE METABOLIC PANEL
ALT: 18 U/L (ref 17–63)
AST: 13 U/L — ABNORMAL LOW (ref 15–41)
Albumin: 3.4 g/dL — ABNORMAL LOW (ref 3.5–5.0)
Alkaline Phosphatase: 61 U/L (ref 38–126)
Anion gap: 6 (ref 5–15)
BUN: 12 mg/dL (ref 6–20)
CO2: 28 mmol/L (ref 22–32)
Calcium: 8.8 mg/dL — ABNORMAL LOW (ref 8.9–10.3)
Chloride: 102 mmol/L (ref 101–111)
Creatinine, Ser: 1.11 mg/dL (ref 0.61–1.24)
GFR calc Af Amer: >60 mL/min (ref >60)
GFR calc non Af Amer: >60 mL/min (ref >60)
Glucose, Bld: 115 mg/dL — ABNORMAL HIGH (ref 65–99)
Potassium: 4 mmol/L (ref 3.5–5.1)
Sodium: 136 mmol/L (ref 135–145)
Total Bilirubin: 0.7 mg/dL (ref 0.3–1.2)
Total Protein: 5.7 g/dL — ABNORMAL LOW (ref 6.5–8.1)

## 2016-09-09 LAB — CBC
HCT: 40 % (ref 39.0–52.0)
Hemoglobin: 13.4 g/dL (ref 13.0–17.0)
MCH: 30.2 pg (ref 26.0–34.0)
MCHC: 33.5 g/dL (ref 30.0–36.0)
MCV: 90.3 fL (ref 78.0–100.0)
Platelets: 235 10*3/uL (ref 150–400)
RBC: 4.43 MIL/uL (ref 4.22–5.81)
RDW: 12.1 % (ref 11.5–15.5)
WBC: 14.6 10*3/uL — ABNORMAL HIGH (ref 4.0–10.5)

## 2016-09-09 LAB — URINE CULTURE: Culture: <10000 — AB

## 2016-09-09 LAB — PROTIME-INR
INR: 1.17
Prothrombin Time: 15 seconds (ref 11.4–15.2)

## 2016-09-09 MED ORDER — TEMAZEPAM 15 MG PO CAPS
15.0000 mg | ORAL_CAPSULE | Freq: Every evening | ORAL | Status: DC | PRN
  Administered 2016-09-09 – 2016-09-13 (×3): 15 mg via ORAL
  Filled 2016-09-09 (×3): qty 1

## 2016-09-09 MED ORDER — HYDROCODONE-ACETAMINOPHEN 10-325 MG PO TABS
1.0000 | ORAL_TABLET | Freq: Four times a day (QID) | ORAL | Status: DC | PRN
  Administered 2016-09-09: 2 via ORAL
  Administered 2016-09-09: 1 via ORAL
  Administered 2016-09-10 (×2): 2 via ORAL
  Filled 2016-09-09 (×4): qty 2

## 2016-09-09 NOTE — Progress Notes (Addendum)
PROGRESS NOTE  Eric Savage  ZOX:[id number] DOB: [birth year] DOA: 09/08/2016 PCP: Egbert GaribaldiMillsaps, KIMBERLY M, NP  Outpatient Specialists: GI, Dr. Loreta AveMann  Brief Narrative: Eric Savage is a 50 y.o. male  with a history of diverticulitis followed by GI, presented 12/2 with significant low abdominal pain with acute diverticulitis seen on CT scan and leukocytosis. Labs at GI 11/28 showed no leukocytosis. He's continued a regular diet and taken cipro/flagyl intermittently since in September. HE was admitted for IV antibiotics and fluids for management of recurrent sigmoid diverticulitis.   Assessment & Plan: Active Problems:   Essential hypertension   Dyslipidemia, goal LDL below 70   Smoker   Diverticulitis   CAD (coronary artery disease)   Acute on chronic diverticulitis: Outpt abx (cipro/flagyl) effective with recurrence of symptoms since Sept 2017. CT abd/pelvis 09/08/16 showed acute sigmoid diverticulitis without abscess or perforation. Appendix normal. WBC 16.7 - Leukocytosis improving. Continue IVF's - Ok to eat clear liquids, but bowel rest is important for him given recurrent flares.  - Will continue IV abx today, probably transition when abd pain improves and leukocytosis resolves.  - Pain not controlled: Will increase to home regimen of hydrocodone 10/325mg  q6h prn and anticipate deescalation quickly. Continue antiemetics prn.  Protonix 40 mg IV every 24 hours. - No colonoscopy in acute flare, but has this scheduled with Dr. Loreta AveMann 12/18. Will likely continue abx until that time.  - Monitor fever curve and cultures.   Hypertension: Chronic, stable.   - Continue home anti-hypertensive medications   Hyperlipidemia: Chronic, stable - Continue home statin  Tobacco abuse: Precontemplative. - Nicotine patch   - Counseled cessation  CAD: Medically managed, cath 2014 with residual 50-60%. No evidence of ACS - Continue meds   DVT prophylaxis: Lovenox Code Status: Full Family  Communication: Wife at bedside Disposition Plan: Due to recurrent course, will continue IV abx until leukocytosis resolves and pain improves. Should be inpatient status.   Consultants:   None  Procedures:   None  Antimicrobials:  Zosyn 12/2 >>    Subjective: Still with constant, waxing-waning, sharp, lower abdominal pain from midline to LLQ improved only for 1 - 2 hrs with medications. Has been eating full diet at home since symptoms began in late Sept. No diarrhea, bloody stool, dark stool, hematuria or pneumaturia. No history of hospitalization for this. No fever.   Objective: Vitals:   09/08/16 1430 09/08/16 1431 09/08/16 2118 09/09/16 0451  BP:  118/76 (!) 99/42 108/66  Pulse:  69 68 71  Resp:  16 19 18   Temp:  98.6 F (37 C) 98.4 F (36.9 C) 98.2 F (36.8 C)  TempSrc:  Oral    SpO2:  100% 96% 95%  Weight: 77.6 kg (171 lb)     Height: 5\' 5"  (1.651 m)       Intake/Output Summary (Last 24 hours) at 09/09/16 1057 Last data filed at 09/09/16 0535  Gross per 24 hour  Intake          1801.67 ml  Output                0 ml  Net          1801.67 ml   Filed Weights   09/08/16 1430  Weight: 77.6 kg (171 lb)    Examination: General exam: 50 y.o. male in no distress Respiratory system: Non-labored breathing room air. Clear to auscultation bilaterally.  Cardiovascular system: Regular rate and rhythm. No murmur, rub, or gallop. No JVD, and  no pedal edema. Gastrointestinal system: Abdomen soft, tender below umbilicus and LLQ without rebound, non-distended, with normoactive bowel sounds. No organomegaly or masses felt. Central nervous system: Alert and oriented. No focal neurological deficits. Extremities: Warm, no deformities Skin: No rashes, lesions or ulcers. No abdominal scars noted. Psychiatry: Judgement and insight appear normal. Mood & affect appropriate.   Data Reviewed: I have personally reviewed following labs and imaging studies  CBC:  Recent Labs Lab  09/08/16 0710 09/09/16 0451  WBC 16.7* 14.6*  HGB 14.8 13.4  HCT 43.9 40.0  MCV 89.4 90.3  PLT 283 235   Basic Metabolic Panel:  Recent Labs Lab 09/08/16 0710 09/09/16 0451  NA 133* 136  K 4.6 4.0  CL 99* 102  CO2 25 28  GLUCOSE 126* 115*  BUN 15 12  CREATININE 1.06 1.11  CALCIUM 9.2 8.8*   GFR: Estimated Creatinine Clearance: 76.5 mL/min (by C-G formula based on SCr of 1.11 mg/dL). Liver Function Tests:  Recent Labs Lab 09/08/16 0710 09/09/16 0451  AST 18 13*  ALT 24 18  ALKPHOS 81 61  BILITOT 0.6 0.7  PROT 6.1* 5.7*  ALBUMIN 3.8 3.4*    Recent Labs Lab 09/08/16 0710  LIPASE 15   No results for input(s): AMMONIA in the last 168 hours. Coagulation Profile:  Recent Labs Lab 09/09/16 0451  INR 1.17   Cardiac Enzymes: No results for input(s): CKTOTAL, CKMB, CKMBINDEX, TROPONINI in the last 168 hours. BNP (last 3 results) No results for input(s): PROBNP in the last 8760 hours. HbA1C: No results for input(s): HGBA1C in the last 72 hours. CBG: No results for input(s): GLUCAP in the last 168 hours. Lipid Profile: No results for input(s): CHOL, HDL, LDLCALC, TRIG, CHOLHDL, LDLDIRECT in the last 72 hours. Thyroid Function Tests: No results for input(s): TSH, T4TOTAL, FREET4, T3FREE, THYROIDAB in the last 72 hours. Anemia Panel: No results for input(s): VITAMINB12, FOLATE, FERRITIN, TIBC, IRON, RETICCTPCT in the last 72 hours. Urine analysis:    Component Value Date/Time   COLORURINE YELLOW 09/08/2016 0725   APPEARANCEUR CLEAR 09/08/2016 0725   LABSPEC 1.023 09/08/2016 0725   PHURINE 5.0 09/08/2016 0725   GLUCOSEU NEGATIVE 09/08/2016 0725   HGBUR NEGATIVE 09/08/2016 0725   BILIRUBINUR NEGATIVE 09/08/2016 0725   KETONESUR NEGATIVE 09/08/2016 0725   PROTEINUR NEGATIVE 09/08/2016 0725   UROBILINOGEN 1.0 05/16/2012 1702   NITRITE NEGATIVE 09/08/2016 0725   LEUKOCYTESUR NEGATIVE 09/08/2016 0725   Sepsis  Labs: @LABRCNTIP (procalcitonin:4,lacticidven:4)  ) Recent Results (from the past 240 hour(s))  Urine culture     Status: None (Preliminary result)   Collection Time: 09/08/16  7:25 AM  Result Value Ref Range Status   Specimen Description URINE, CLEAN CATCH  Final   Special Requests NONE  Final   Culture PENDING  Incomplete   Report Status PENDING  Incomplete  Culture, blood (Routine X 2) w Reflex to ID Panel     Status: None (Preliminary result)   Collection Time: 09/08/16  1:40 PM  Result Value Ref Range Status   Specimen Description BLOOD RIGHT ANTECUBITAL  Final   Special Requests BOTTLES DRAWN AEROBIC AND ANAEROBIC 10CC  Final   Culture PENDING  Incomplete   Report Status PENDING  Incomplete     Radiology Studies: X-ray Chest Pa And Lateral  Result Date: 09/09/2016 CLINICAL DATA:  Lower abdominal pain. EXAM: CHEST  2 VIEW COMPARISON:  January 27, 2013 FINDINGS: The heart size and mediastinal contours are within normal limits. Both lungs are clear. The  visualized skeletal structures are unremarkable. IMPRESSION: No active cardiopulmonary disease. Electronically Signed   By: Gerome Sam III M.D   On: 09/09/2016 09:11   Ct Abdomen Pelvis W Contrast  Result Date: 09/08/2016 CLINICAL DATA:  50 year old male with history of lower abdominal pain and leukocytosis on antibiotics. Possible recurrent diverticulitis. Some emesis this morning. EXAM: CT ABDOMEN AND PELVIS WITH CONTRAST TECHNIQUE: Multidetector CT imaging of the abdomen and pelvis was performed using the standard protocol following bolus administration of intravenous contrast. CONTRAST:  ISOVUE-300 IOPAMIDOL (ISOVUE-300) INJECTION 61% COMPARISON:  CT the abdomen and pelvis 08/06/2016. FINDINGS: Lower chest: Atherosclerotic calcifications in the right coronary artery. Hepatobiliary: Hypervascular lesions are again noted in segment 4A and 3, measuring up to 2.2 x 2.7 cm in segment 4A (image 11 of series 2) very similar to  remote prior examinations dating back to 03/12/2004, most compatible with benign cavernous hemangiomas. No suspicious hepatic lesions are otherwise identified. No intra or extrahepatic biliary ductal dilatation. Gallbladder is normal in appearance. Pancreas: No pancreatic mass. No pancreatic ductal dilatation. No pancreatic or peripancreatic fluid or inflammatory changes. Spleen: Unremarkable. Adrenals/Urinary Tract: 2 low-attenuation lesions in the left kidney measuring up to 2.2 cm in the upper pole are compatible with simple cysts. Right kidney and bilateral adrenal glands are normal in appearance. There is no hydroureteronephrosis or perinephric stranding to indicate urinary tract obstruction at this time. Urinary bladder is normal in appearance. Stomach/Bowel: Normal appearance of the stomach. There is no pathologic dilatation of small bowel or colon. Numerous colonic diverticulae are noted. Importantly, in the sigmoid colon there is also extensive mural thickening and extensive surrounding inflammatory changes associated with the sigmoid mesocolon, compatible with severe acute diverticulitis. No discrete diverticular abscess is identified. No signs of frank perforation of the bowel. Normal appendix. Vascular/Lymphatic: Aortic atherosclerosis, without evidence of aneurysm or dissection in the abdominal and pelvic vasculature. No lymphadenopathy noted in the abdomen or pelvis. Reproductive: Prostate gland and seminal vesicles are unremarkable in appearance. Other: Trace volume of ascites noted in the low anatomic pelvis, presumably reactive. No larger volume of ascites. No pneumoperitoneum. Musculoskeletal: There are no aggressive appearing lytic or blastic lesions noted in the visualized portions of the skeleton. IMPRESSION: 1. Acute diverticulitis of the sigmoid colon. No discrete diverticular abscess or signs of frank perforation are noted at this time. 2. Normal appendix. 3. 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 4. Two simple cysts in the left kidney are incidentally noted. 5. Aortic atherosclerosis. Electronically Signed   By: Trudie Reed M.D.   On: 09/08/2016 10:58    Scheduled Meds: . aspirin  81 mg Oral Daily  . enoxaparin (LOVENOX) injection  40 mg Subcutaneous Q24H  . lisinopril  20 mg Oral Daily  . nicotine  21 mg Transdermal Daily  . pantoprazole (PROTONIX) IV  40 mg Intravenous Q24H  . piperacillin-tazobactam (ZOSYN)  IV  3.375 g Intravenous Q8H  . simvastatin  20 mg Oral q1800   Continuous Infusions: . sodium chloride 100 mL/hr at 09/08/16 1458     LOS: 1 day   Time spent: 25 minutes.  Hazeline Junker, MD [facility] Pager [phone]  If 7PM-7AM, please contact night-coverage [email or web address] Password [email or web address] 09/09/2016, 10:57 AM

## 2016-09-10 ENCOUNTER — Encounter (HOSPITAL_COMMUNITY): Payer: Self-pay | Admitting: Family Medicine

## 2016-09-10 LAB — CBC
HCT: 39.5 % (ref 39.0–52.0)
Hemoglobin: 13 g/dL (ref 13.0–17.0)
MCH: 30 pg (ref 26.0–34.0)
MCHC: 32.9 g/dL (ref 30.0–36.0)
MCV: 91 fL (ref 78.0–100.0)
Platelets: 234 10*3/uL (ref 150–400)
RBC: 4.34 MIL/uL (ref 4.22–5.81)
RDW: 12.1 % (ref 11.5–15.5)
WBC: 11.8 10*3/uL — ABNORMAL HIGH (ref 4.0–10.5)

## 2016-09-10 LAB — BASIC METABOLIC PANEL
Anion gap: 10 (ref 5–15)
BUN: 9 mg/dL (ref 6–20)
CO2: 27 mmol/L (ref 22–32)
Calcium: 8.9 mg/dL (ref 8.9–10.3)
Chloride: 101 mmol/L (ref 101–111)
Creatinine, Ser: 0.99 mg/dL (ref 0.61–1.24)
GFR calc Af Amer: >60 mL/min (ref >60)
GFR calc non Af Amer: >60 mL/min (ref >60)
Glucose, Bld: 121 mg/dL — ABNORMAL HIGH (ref 65–99)
Potassium: 4.2 mmol/L (ref 3.5–5.1)
Sodium: 138 mmol/L (ref 135–145)

## 2016-09-10 MED ORDER — HYDROCODONE-ACETAMINOPHEN 10-325 MG PO TABS
1.0000 | ORAL_TABLET | ORAL | Status: DC | PRN
  Administered 2016-09-10 – 2016-09-14 (×22): 2 via ORAL
  Filled 2016-09-10 (×22): qty 2

## 2016-09-10 NOTE — Consult Note (Signed)
[facility] Surgery Consult Note  Eric Savage Apr 06, 1966  [id number].    Requesting MD: Vance Gather Chief Complaint/Reason for Consult: Diverticulitis  HPI:  Eric Savage is a 50yo male admitted to [facility] on 09/08/16 with worsening abdominal pain 2/2 diverticulitis. Patient reports a 15 year history of diverticulosis with 1-2x/year flare-ups. He is followed by Dr. Collene Mares in GI. Since September of this year he has been on and off cipro/flagyl for diverticulitis. States that when he would stop the antibiotics his pain would return within 2-3 days. His pain is LLQ. He reports multiple episodes of loose stools daily, as well as 2 bloody bowel movements (blood on tissue). Prior to admission he was having n/v, this has since resolved. Currently tolerating clear liquids. Pain improved since admission but not resolved. PO liquids to do not make his pain worse. On admission CT showed acute diverticulitis of the sigmoid colon with no discrete diverticular abscess or signs of frank perforation. His WBC was 16.7 and has trended down to 11.8.   PMH significant for h/o diverticulitis followed by GI, CAD s/p stenting 2010, HTN, HLD, GERD  No prior history of abdominal surgery Last colonoscopy 2010 by Dr. 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   Respiratory: Negative.   Cardiovascular: Negative.   Gastrointestinal: Positive for abdominal pain and diarrhea.  Genitourinary: Negative.   Skin: Negative.      Family History  Problem Relation Age of Onset  . CAD Father 34    CABG    Past Medical History:  Diagnosis Date  . Acid reflux   . CAD S/P percutaneous coronary angioplasty 12/10   RCA/CFX DES  . Chest pain 01/2013   Evaluate cardiac catheterization, no obstructive CAD  . High cholesterol   . Hypertension      Past Surgical History:  Procedure Laterality Date  . CARDIAC CATHETERIZATION  05/2012; 01/28/2013   No significant in-stent restenosis in circumflex or RCA. 60-70% moderate D2 lesion; EF 60-65%.  . CORONARY ANGIOPLASTY WITH STENT PLACEMENT  09/2009   Proximal RCA - Cypher DES 3.0 mm but 33 mm postdilated to 2.7 mm; proximal CFX XIENCE V. DES 2.75 mm x 15 mm postdilated to 3.0 mm  . LEFT HEART CATHETERIZATION WITH CORONARY ANGIOGRAM N/A 05/19/2012   Procedure: LEFT HEART CATHETERIZATION WITH CORONARY ANGIOGRAM;  Surgeon: Leonie Man, MD;  Location: [facility] CATH LAB;  Service: Cardiovascular;  Laterality: N/A;  . LEFT HEART CATHETERIZATION WITH CORONARY ANGIOGRAM N/A 01/28/2013   Procedure: LEFT HEART CATHETERIZATION WITH CORONARY ANGIOGRAM;  Surgeon: Troy Sine, MD;  Location: [facility] CATH LAB;  Service: Cardiovascular;  Laterality: N/A;  . NM MYOCAR PERF WALL MOTION  01/04/2011   Protocol:Bruce, post stress EF=69%, Exercise Cap 13 METS, attenuation defect in inferior region of myocardium, no sig. ischemia demonstrated  . TONSILLECTOMY  ~ 1973  . TRANSTHORACIC ECHOCARDIOGRAM  05/13/2009   EF =>55%, mild mitral regurg, trace tricuspid regurg.    Social History:  reports that he has been smoking Cigarettes.  He has a 7.50 pack-year smoking history. He has never used smokeless tobacco. He reports that he drinks about 2.4 oz of alcohol per week . He reports that he does not use drugs.  Allergies: No Known Allergies  Medications Prior to Admission  Medication Sig Dispense Refill  . aspirin 81 MG tablet Take 81  mg by mouth daily.     . ciprofloxacin (CIPRO) 500 MG tablet Take 500 mg by mouth 2 (two) times daily.    Marland Kitchen lisinopril (PRINIVIL,ZESTRIL) 20 MG tablet TAKE 1 TABLET (20 MG TOTAL) BY MOUTH DAILY. 90 tablet 0  . metroNIDAZOLE (FLAGYL) 250 MG tablet Take 250 mg by mouth 3 (three) times daily.    . simvastatin (ZOCOR) 20 MG tablet TAKE 1 TABLET (20 MG TOTAL) BY MOUTH EVERY EVENING. 90 tablet 0   . nitroGLYCERIN (NITROSTAT) 0.4 MG SL tablet Place 1 tablet (0.4 mg total) under the tongue every 5 (five) minutes as needed for chest pain. 25 tablet 2    Blood pressure 112/68, pulse (!) 56, temperature 98.6 F (37 C), resp. rate 18, height '5\' 5"'$  (1.651 m), weight 171 lb (77.6 kg), SpO2 99 %. Physical Exam: General: pleasant, WD/WN white male who is laying in bed in NAD HEENT: head is normocephalic, atraumatic.  Sclera are noninjected.  Mouth is pink and moist Heart: regular, rate, and rhythm.  No obvious murmurs, gallops, or rubs noted.  Palpable pedal pulses bilaterally Lungs: CTAB, no wheezes, rhonchi, or rales noted.  Respiratory effort nonlabored Abd: soft, obese, ND, +BS, no masses, hernias, or organomegaly. Moderate TTP LLQ and suprapubic region MS: all 4 extremities are symmetrical with no cyanosis, clubbing, or edema. Skin: warm and dry with no masses, lesions, or rashes Psych: A&Ox3 with an appropriate affect. Neuro: CM 2-12 intact, extremity CSM intact bilaterally, normal speech  Results for orders placed or performed during the hospital encounter of 09/08/16 (from the past 48 hour(s))  Culture, blood (Routine X 2) w Reflex to ID Panel     Status: None (Preliminary result)   Collection Time: 09/08/16  1:40 PM  Result Value Ref Range   Specimen Description BLOOD RIGHT ANTECUBITAL    Special Requests BOTTLES DRAWN AEROBIC AND ANAEROBIC 10CC    Culture NO GROWTH 1 DAY    Report Status PENDING   Culture, blood (Routine X 2) w Reflex to ID Panel     Status: None (Preliminary result)   Collection Time: 09/08/16  1:46 PM  Result Value Ref Range   Specimen Description BLOOD RIGHT HAND    Special Requests BOTTLES DRAWN AEROBIC AND ANAEROBIC 5CC    Culture NO GROWTH 1 DAY    Report Status PENDING   Comprehensive metabolic panel     Status: Abnormal   Collection Time: 09/09/16  4:51 AM  Result Value Ref Range   Sodium 136 135 - 145 mmol/L   Potassium 4.0 3.5 - 5.1 mmol/L    Chloride 102 101 - 111 mmol/L   CO2 28 22 - 32 mmol/L   Glucose, Bld 115 (H) 65 - 99 mg/dL   BUN 12 6 - 20 mg/dL   Creatinine, Ser 1.11 0.61 - 1.24 mg/dL   Calcium 8.8 (L) 8.9 - 10.3 mg/dL   Total Protein 5.7 (L) 6.5 - 8.1 g/dL   Albumin 3.4 (L) 3.5 - 5.0 g/dL   AST 13 (L) 15 - 41 U/L   ALT 18 17 - 63 U/L   Alkaline Phosphatase 61 38 - 126 U/L   Total Bilirubin 0.7 0.3 - 1.2 mg/dL   GFR calc non Af Amer >60 >60 mL/min   GFR calc Af Amer >60 >60 mL/min    Comment: (NOTE) The eGFR has been calculated using the CKD EPI equation. This calculation has not been validated in all clinical situations. eGFR's persistently <60 mL/min signify possible Chronic  Kidney Disease.    Anion gap 6 5 - 15  CBC     Status: Abnormal   Collection Time: 09/09/16  4:51 AM  Result Value Ref Range   WBC 14.6 (H) 4.0 - 10.5 K/uL   RBC 4.43 4.22 - 5.81 MIL/uL   Hemoglobin 13.4 13.0 - 17.0 g/dL   HCT 40.0 39.0 - 52.0 %   MCV 90.3 78.0 - 100.0 fL   MCH 30.2 26.0 - 34.0 pg   MCHC 33.5 30.0 - 36.0 g/dL   RDW 12.1 11.5 - 15.5 %   Platelets 235 150 - 400 K/uL  Protime-INR     Status: None   Collection Time: 09/09/16  4:51 AM  Result Value Ref Range   Prothrombin Time 15.0 11.4 - 15.2 seconds   INR 1.17   CBC     Status: Abnormal   Collection Time: 09/10/16  6:19 AM  Result Value Ref Range   WBC 11.8 (H) 4.0 - 10.5 K/uL   RBC 4.34 4.22 - 5.81 MIL/uL   Hemoglobin 13.0 13.0 - 17.0 g/dL   HCT 39.5 39.0 - 52.0 %   MCV 91.0 78.0 - 100.0 fL   MCH 30.0 26.0 - 34.0 pg   MCHC 32.9 30.0 - 36.0 g/dL   RDW 12.1 11.5 - 15.5 %   Platelets 234 150 - 400 K/uL  Basic metabolic panel     Status: Abnormal   Collection Time: 09/10/16  6:19 AM  Result Value Ref Range   Sodium 138 135 - 145 mmol/L   Potassium 4.2 3.5 - 5.1 mmol/L   Chloride 101 101 - 111 mmol/L   CO2 27 22 - 32 mmol/L   Glucose, Bld 121 (H) 65 - 99 mg/dL   BUN 9 6 - 20 mg/dL   Creatinine, Ser 0.99 0.61 - 1.24 mg/dL   Calcium 8.9 8.9 - 10.3 mg/dL    GFR calc non Af Amer >60 >60 mL/min   GFR calc Af Amer >60 >60 mL/min    Comment: (NOTE) The eGFR has been calculated using the CKD EPI equation. This calculation has not been validated in all clinical situations. eGFR's persistently <60 mL/min signify possible Chronic Kidney Disease.    Anion gap 10 5 - 15   X-ray Chest Pa And Lateral  Result Date: 09/09/2016 CLINICAL DATA:  Lower abdominal pain. EXAM: CHEST  2 VIEW COMPARISON:  January 27, 2013 FINDINGS: The heart size and mediastinal contours are within normal limits. Both lungs are clear. The visualized skeletal structures are unremarkable. IMPRESSION: No active cardiopulmonary disease. Electronically Signed   By: Dorise Bullion III M.D   On: 09/09/2016 09:11      Assessment/Plan Acute sigmoid diverticulitis - 15 year history of 1-2x/year flare-ups of diverticulitis treated conservatively - since September he has been on/off cipro/flagyl without resolution of his symptoms - CT 09/08/16 showed acute diverticulitis of the sigmoid colon, no discrete diverticular abscess or signs of frank perforation are noted at this time - has been on zosyn since admission with some resolution of his symptoms - WBC trending down 11.8, afebrile  CAD s/p stenting 2010 - followed by Dr. Ellyn Hack. Echo 05/13/2009 EF >55% HTN HLD GERD Tobacco abuse  ID - zosyn 12/2>> VTE - lovenox, SCDs FEN - clear liquids  Plan - symptoms improving since admission but not resolved. WBC trending down. Would recommend continuing zosyn, CLD with slow advancement as tolerated, serial abdominal exams. Hopefully will resolve without surgical intervention and can follow-up outpatient to  discuss elective surgery after colonoscopy in 4-6 weeks. If he does not continue to improve could consider switching antibiotic to Invanz, or repeat CT scan to eval healing. Will discuss with MD.  Jerrye Beavers, [facility] Surgery 09/10/2016, 12:12 PM Pager:  [phone] Consults: [phone] Mon-Fri 7:00 am-4:30 pm Sat-Sun 7:00 am-11:30 am

## 2016-09-10 NOTE — Care Management Note (Signed)
Case Management Note  Patient Details  Name: Eric Savage MRN: [id number] Date of Birth: 1966-02-10  Subjective/Objective:                 Patient from home with wife, admitted with severe diverticulitis. MD treating cautiously, advancing diet slowly, IVF IV Abx.  PCP Millsaps   Action/Plan:  Anticipate DC to home Wednesday if continues to tolerate diet advancements.   Expected Discharge Date:                  Expected Discharge Plan:  Home/Self Care  In-House Referral:     Discharge planning Services  CM Consult  Post Acute Care Choice:    Choice offered to:     DME Arranged:    DME Agency:     HH Arranged:    HH Agency:     Status of Service:  In process, will continue to follow  If discussed at Long Length of Stay Meetings, dates discussed:    Additional Comments:  Lawerance SabalDebbie Naara Kelty, RN 09/10/2016, 11:52 AM

## 2016-09-10 NOTE — Consult Note (Signed)
Reason for Consult: Worsening abdominal pain. Referring Physician: THP  [name] is an 50 y.o. male.  HPI: Eric Savage is a 50 year old white male who is being seen in the office almost every week for the last 3 weeks for acute diverticulitis. He has been maintained on Ciprofloxacin 500 twice a day along with Metronidazole 250 mg 3 times a day for the last 3 weeks. He has received a few pills of hydrocodone for abdominal pain as well but I have been reluctant to prescribe him this medication as he is a previous history of cocaine abuse. Patient tells me he got some more Hydrocodone pills from Everardo Beals, Nurse practitioner at [facility]. On 09/07/2016,  When he went to bed he had left lower quadrant pain that had worsened over the course of the day and was at his intensity of 8/10 that night.  He decided to take one of his hydrocodone pills that night and was hoping that the pain would resolve by the next morning. However at about 6 AM on Saturday when he woke up on 09/08/2016 his pain was worse and this prompted him to come to the emergency room on evaluation in the ER on a repeat hospital he was found to have worsening diverticular disease in the sigmoid colon and was hospitalized for IV antibiotics and pain control. Patient claims he is feeling slightly better today but the pain is still 8 out of 10. He has been on a low residue diet since admission and has been consuming Jell-O and chicken broth for the last couple of days. He denies having any fever chills or rigors. He usually has 1 bowel movement per day but since his been in the hospital he has had a very small bowel volume bowel movement yesterday and another very small volume BM today. There is no history of melena or hematochezia. Patient has been anxious to have his colonoscopy so we can proceed with a sigmoid colectomy but these recurrent bouts of diverticulitis has prevented him from doing so,.  Past Medical  History:  Diagnosis Date  . Acid reflux   . CAD S/P percutaneous coronary angioplasty 12/10   RCA/CFX DES  . Chest pain 01/2013   Evaluate cardiac catheterization, no obstructive CAD  . High cholesterol   . Hypertension    Past Surgical History:  Procedure Laterality Date  . CARDIAC CATHETERIZATION  05/2012; 01/28/2013   No significant in-stent restenosis in circumflex or RCA. 60-70% moderate D2 lesion; EF 60-65%.  . CORONARY ANGIOPLASTY WITH STENT PLACEMENT  09/2009   Proximal RCA - Cypher DES 3.0 mm but 33 mm postdilated to 2.7 mm; proximal CFX XIENCE V. DES 2.75 mm x 15 mm postdilated to 3.0 mm  . LEFT HEART CATHETERIZATION WITH CORONARY ANGIOGRAM N/A 05/19/2012   Procedure: LEFT HEART CATHETERIZATION WITH CORONARY ANGIOGRAM;  Surgeon: Leonie Man, MD;  Location: [facility] CATH LAB;  Service: Cardiovascular;  Laterality: N/A;  . LEFT HEART CATHETERIZATION WITH CORONARY ANGIOGRAM N/A 01/28/2013   Procedure: LEFT HEART CATHETERIZATION WITH CORONARY ANGIOGRAM;  Surgeon: Troy Sine, MD;  Location: [facility] CATH LAB;  Service: Cardiovascular;  Laterality: N/A;  . NM MYOCAR PERF WALL MOTION  01/04/2011   Protocol:Bruce, post stress EF=69%, Exercise Cap 13 METS, attenuation defect in inferior region of myocardium, no sig. ischemia demonstrated  . TONSILLECTOMY  ~ 1973  . TRANSTHORACIC ECHOCARDIOGRAM  05/13/2009   EF =>55%, mild mitral regurg, trace tricuspid regurg.   Family History  Problem Relation  Age of Onset  . CAD Father 70    CABG   Social History:  reports that he has been smoking Cigarettes.  He has a 7.50 pack-year smoking history. He has never used smokeless tobacco. He reports that he drinks about 2.4 oz of alcohol per week . He reports that he does not use drugs.  Allergies: No Known Allergies  Medications: I have reviewed the patient's current medications.  Results for orders placed or performed during the hospital encounter of 09/08/16 (from the past 48 hour(s))  Comprehensive  metabolic panel     Status: Abnormal   Collection Time: 09/09/16  4:51 AM  Result Value Ref Range   Sodium 136 135 - 145 mmol/L   Potassium 4.0 3.5 - 5.1 mmol/L   Chloride 102 101 - 111 mmol/L   CO2 28 22 - 32 mmol/L   Glucose, Bld 115 (H) 65 - 99 mg/dL   BUN 12 6 - 20 mg/dL   Creatinine, Ser 1.11 0.61 - 1.24 mg/dL   Calcium 8.8 (L) 8.9 - 10.3 mg/dL   Total Protein 5.7 (L) 6.5 - 8.1 g/dL   Albumin 3.4 (L) 3.5 - 5.0 g/dL   AST 13 (L) 15 - 41 U/L   ALT 18 17 - 63 U/L   Alkaline Phosphatase 61 38 - 126 U/L   Total Bilirubin 0.7 0.3 - 1.2 mg/dL   GFR calc non Af Amer >60 >60 mL/min   GFR calc Af Amer >60 >60 mL/min    Comment: (NOTE) The eGFR has been calculated using the CKD EPI equation. This calculation has not been validated in all clinical situations. eGFR's persistently <60 mL/min signify possible Chronic Kidney Disease.    Anion gap 6 5 - 15  CBC     Status: Abnormal   Collection Time: 09/09/16  4:51 AM  Result Value Ref Range   WBC 14.6 (H) 4.0 - 10.5 K/uL   RBC 4.43 4.22 - 5.81 MIL/uL   Hemoglobin 13.4 13.0 - 17.0 g/dL   HCT 40.0 39.0 - 52.0 %   MCV 90.3 78.0 - 100.0 fL   MCH 30.2 26.0 - 34.0 pg   MCHC 33.5 30.0 - 36.0 g/dL   RDW 12.1 11.5 - 15.5 %   Platelets 235 150 - 400 K/uL  Protime-INR     Status: None   Collection Time: 09/09/16  4:51 AM  Result Value Ref Range   Prothrombin Time 15.0 11.4 - 15.2 seconds   INR 1.17   CBC     Status: Abnormal   Collection Time: 09/10/16  6:19 AM  Result Value Ref Range   WBC 11.8 (H) 4.0 - 10.5 K/uL   RBC 4.34 4.22 - 5.81 MIL/uL   Hemoglobin 13.0 13.0 - 17.0 g/dL   HCT 39.5 39.0 - 52.0 %   MCV 91.0 78.0 - 100.0 fL   MCH 30.0 26.0 - 34.0 pg   MCHC 32.9 30.0 - 36.0 g/dL   RDW 12.1 11.5 - 15.5 %   Platelets 234 150 - 400 K/uL  Basic metabolic panel     Status: Abnormal   Collection Time: 09/10/16  6:19 AM  Result Value Ref Range   Sodium 138 135 - 145 mmol/L   Potassium 4.2 3.5 - 5.1 mmol/L   Chloride 101 101 - 111  mmol/L   CO2 27 22 - 32 mmol/L   Glucose, Bld 121 (H) 65 - 99 mg/dL   BUN 9 6 - 20 mg/dL   Creatinine, Ser  0.99 0.61 - 1.24 mg/dL   Calcium 8.9 8.9 - 10.3 mg/dL   GFR calc non Af Amer >60 >60 mL/min   GFR calc Af Amer >60 >60 mL/min    Comment: (NOTE) The eGFR has been calculated using the CKD EPI equation. This calculation has not been validated in all clinical situations. eGFR's persistently <60 mL/min signify possible Chronic Kidney Disease.    Anion gap 10 5 - 15   X-ray Chest Pa And Lateral  Result Date: 09/09/2016 CLINICAL DATA:  Lower abdominal pain. EXAM: CHEST  2 VIEW COMPARISON:  January 27, 2013 FINDINGS: The heart size and mediastinal contours are within normal limits. Both lungs are clear. The visualized skeletal structures are unremarkable. IMPRESSION: No active cardiopulmonary disease. Electronically Signed   By: Dorise Bullion III M.D   On: 09/09/2016 09:11   Review of Systems  Constitutional: Negative.   HENT: Negative.   Eyes: Negative.   Respiratory: Negative.   Cardiovascular: Negative.   Gastrointestinal: Positive for abdominal pain and constipation. Negative for blood in stool, diarrhea, heartburn, melena, nausea and vomiting.  Genitourinary: Negative.   Musculoskeletal: Negative.   Skin: Negative.   Neurological: Negative.   Endo/Heme/Allergies: Negative.   Psychiatric/Behavioral: The patient is nervous/anxious.    Blood pressure 112/68, pulse (!) 56, temperature 98.6 F (37 C), resp. rate 18, height '5\' 5"'  (1.651 m), weight 77.6 kg (171 lb), SpO2 99 %. Physical Exam  Constitutional: He is oriented to person, place, and time. He appears well-developed and well-nourished.  HENT:  Head: Normocephalic and atraumatic.  Eyes: Conjunctivae and EOM are normal. Pupils are equal, round, and reactive to light.  Neck: Normal range of motion. Neck supple.  Cardiovascular: Normal rate and regular rhythm.   Respiratory: Effort normal and breath sounds normal.  GI:  Soft. Bowel sounds are normal. There is tenderness. There is guarding.  Musculoskeletal: Normal range of motion.  Neurological: He is alert and oriented to person, place, and time.  Skin: Skin is warm and dry.  Psychiatric: He has a normal mood and affect. His behavior is normal. Judgment and thought content normal.   Assessment/Plan: 1) Acute diverticulitis worsening symptoms the last 48 hours. Patient is on Zosyn and ciprofloxacin and metronidazole and is receiving hydrocodone and Toradol for pain control. He is encouraged to follow low-residue diet and minimize use of pain medications. His WBC count has improved since admission on the above-mentioned antibiotics. Continue present care.  2)  Drug induced constipation-On Senokot.  3) CAD/Hyperlipidemia/HTN: On Simvastatin and Lisinopril.  Karn Derk 09/10/2016, 5:39 PM

## 2016-09-10 NOTE — Progress Notes (Signed)
PROGRESS NOTE  Eric Savage J Eric Savage  WUJ:[id number] DOB: Feb 24, 1966 DOA: 09/08/2016 PCP: Eric Savage, Eric Savage  Outpatient Specialists: GI, Dr. Loreta Savage  Brief Narrative: Eric Savage is a 50 y.o. male  with a history of diverticulitis followed by GI, presented 12/2 with significant low abdominal pain with acute diverticulitis seen on CT scan and leukocytosis. Labs at GI 11/28 showed no leukocytosis. He's continued a regular diet and taken cipro/flagyl intermittently since in September with improvement but no resolution. He was admitted for IV antibiotics and fluids for management of recurrent sigmoid diverticulitis.   Assessment & Plan: Active Problems:   Essential hypertension   Dyslipidemia, goal LDL below 70   Smoker   Diverticulitis   CAD (coronary artery disease)   Acute on chronic diverticulitis: Outpt abx (cipro/flagyl) effective with recurrence of symptoms since Sept 2017. CT abd/pelvis 09/08/16 showed acute sigmoid diverticulitis without abscess or perforation. Appendix normal. WBC 16.7 - Leukocytosis improving. Continue IVF's - Clear liquids - Continue IV zosyn - Pain not controlled: Continue home regimen of hydrocodone 10/325mg  q4h prn, he has a history of cocaine abuse, so will monitor this closely.  - Continue antiemetics prn. - Discussed with Dr. Loreta Savage who will evaluate the patient. She recommends gen surgery consult for recurrence of diverticulitis. Gen surgery consulted.  - No colonoscopy in acute flare, but has this scheduled with Dr. Loreta Savage 12/18.  - Monitor fever curve and cultures.   Hypertension: Chronic, stable.   - Continue home anti-hypertensive medications   Hyperlipidemia: Chronic, stable - Continue home statin  Tobacco abuse: Precontemplative. - Nicotine patch   - Counseled cessation  CAD: Medically managed, cath 2014 with residual 50-60%. No evidence of ACS - Continue meds   DVT prophylaxis: Lovenox Code Status: Full Family Communication: Wife at  bedside Disposition Plan: Due to recurrent course, will continue IV abx and await consults.   Consultants:   GI, Dr. Loreta Savage  General surgery  Procedures:   None  Antimicrobials:  Zosyn 12/2 >>    Subjective: Still with constant, waxing-waning, sharp, lower abdominal pain from midline to LLQ improved only for 1 - 2 hrs with medications. Has been eating full diet at home since symptoms began in late Sept. No diarrhea, bloody stool, dark stool, hematuria or pneumaturia. No history of hospitalization for this.  Objective: Vitals:   09/09/16 1542 09/09/16 2102 09/10/16 0546 09/10/16 0602  BP: 118/67 130/75 118/76 112/68  Pulse: 66 66 62 (!) 56  Resp: 16 18 18 18   Temp: 97.7 F (36.5 C) 99.1 F (37.3 C) 97.9 F (36.6 C) 98.6 F (37 C)  TempSrc: Oral Oral Oral   SpO2: 100% 98% 97% 99%  Weight:      Height:        Intake/Output Summary (Last 24 hours) at 09/10/16 1133 Last data filed at 09/10/16 0604  Gross per 24 hour  Intake          2788.33 ml  Output                0 ml  Net          2788.33 ml   Filed Weights   09/08/16 1430  Weight: 77.6 kg (171 lb)    Examination: General exam: 50 y.o. male in no distress Respiratory system: Non-labored breathing room air. Clear to auscultation bilaterally.  Cardiovascular system: Regular rate and rhythm. No murmur, rub, or gallop. No JVD, and no pedal edema. Gastrointestinal system: Abdomen soft, tender below umbilicus and LLQ without  rebound, non-distended, with normoactive bowel sounds. No organomegaly or masses felt. Central nervous system: Alert and oriented. No focal neurological deficits. Extremities: Warm, no deformities Skin: No rashes, lesions or ulcers. No abdominal scars noted. Psychiatry: Judgement and insight appear normal. Mood & affect appropriate.   Data Reviewed: I have personally reviewed following labs and imaging studies  CBC:  Recent Labs Lab 09/08/16 0710 09/09/16 0451 09/10/16 0619  WBC 16.7*  14.6* 11.8*  HGB 14.8 13.4 13.0  HCT 43.9 40.0 39.5  MCV 89.4 90.3 91.0  PLT 283 235 234   Basic Metabolic Panel:  Recent Labs Lab 09/08/16 0710 09/09/16 0451 09/10/16 0619  NA 133* 136 138  K 4.6 4.0 4.2  CL 99* 102 101  CO2 25 28 27   GLUCOSE 126* 115* 121*  BUN 15 12 9   CREATININE 1.06 1.11 0.99  CALCIUM 9.2 8.8* 8.9   GFR: Estimated Creatinine Clearance: 85.7 mL/min (by C-G formula based on SCr of 0.99 mg/dL). Liver Function Tests:  Recent Labs Lab 09/08/16 0710 09/09/16 0451  AST 18 13*  ALT 24 18  ALKPHOS 81 61  BILITOT 0.6 0.7  PROT 6.1* 5.7*  ALBUMIN 3.8 3.4*    Recent Labs Lab 09/08/16 0710  LIPASE 15   No results for input(s): AMMONIA in the last 168 hours. Coagulation Profile:  Recent Labs Lab 09/09/16 0451  INR 1.17   Cardiac Enzymes: No results for input(s): CKTOTAL, CKMB, CKMBINDEX, TROPONINI in the last 168 hours. BNP (last 3 results) No results for input(s): PROBNP in the last 8760 hours. HbA1C: No results for input(s): HGBA1C in the last 72 hours. CBG: No results for input(s): GLUCAP in the last 168 hours. Lipid Profile: No results for input(s): CHOL, HDL, LDLCALC, TRIG, CHOLHDL, LDLDIRECT in the last 72 hours. Thyroid Function Tests: No results for input(s): TSH, T4TOTAL, FREET4, T3FREE, THYROIDAB in the last 72 hours. Anemia Panel: No results for input(s): VITAMINB12, FOLATE, FERRITIN, TIBC, IRON, RETICCTPCT in the last 72 hours. Urine analysis:    Component Value Date/Time   COLORURINE YELLOW 09/08/2016 0725   APPEARANCEUR CLEAR 09/08/2016 0725   LABSPEC 1.023 09/08/2016 0725   PHURINE 5.0 09/08/2016 0725   GLUCOSEU NEGATIVE 09/08/2016 0725   HGBUR NEGATIVE 09/08/2016 0725   BILIRUBINUR NEGATIVE 09/08/2016 0725   KETONESUR NEGATIVE 09/08/2016 0725   PROTEINUR NEGATIVE 09/08/2016 0725   UROBILINOGEN 1.0 05/16/2012 1702   NITRITE NEGATIVE 09/08/2016 0725   LEUKOCYTESUR NEGATIVE 09/08/2016 0725   Sepsis  Labs: @LABRCNTIP (procalcitonin:4,lacticidven:4)  ) Recent Results (from the past 240 hour(s))  Urine culture     Status: Abnormal   Collection Time: 09/08/16  7:25 AM  Result Value Ref Range Status   Specimen Description URINE, CLEAN CATCH  Final   Special Requests NONE  Final   Culture <10,000 COLONIES/mL INSIGNIFICANT GROWTH (A)  Final   Report Status 09/09/2016 FINAL  Final  Culture, blood (Routine X 2) w Reflex to ID Panel     Status: None (Preliminary result)   Collection Time: 09/08/16  1:40 PM  Result Value Ref Range Status   Specimen Description BLOOD RIGHT ANTECUBITAL  Final   Special Requests BOTTLES DRAWN AEROBIC AND ANAEROBIC 10CC  Final   Culture NO GROWTH 1 DAY  Final   Report Status PENDING  Incomplete  Culture, blood (Routine X 2) w Reflex to ID Panel     Status: None (Preliminary result)   Collection Time: 09/08/16  1:46 PM  Result Value Ref Range Status   Specimen Description  BLOOD RIGHT HAND  Final   Special Requests BOTTLES DRAWN AEROBIC AND ANAEROBIC 5CC  Final   Culture NO GROWTH 1 DAY  Final   Report Status PENDING  Incomplete     Radiology Studies: X-ray Chest Pa And Lateral  Result Date: 09/09/2016 CLINICAL DATA:  Lower abdominal pain. EXAM: CHEST  2 VIEW COMPARISON:  January 27, 2013 FINDINGS: The heart size and mediastinal contours are within normal limits. Both lungs are clear. The visualized skeletal structures are unremarkable. IMPRESSION: No active cardiopulmonary disease. Electronically Signed   By: Gerome Samavid  Williams III M.D   On: 09/09/2016 09:11    Scheduled Meds: . aspirin  81 mg Oral Daily  . enoxaparin (LOVENOX) injection  40 mg Subcutaneous Q24H  . lisinopril  20 mg Oral Daily  . nicotine  21 mg Transdermal Daily  . pantoprazole (PROTONIX) IV  40 mg Intravenous Q24H  . piperacillin-tazobactam (ZOSYN)  IV  3.375 g Intravenous Q8H  . simvastatin  20 mg Oral q1800   Continuous Infusions: . sodium chloride 100 mL/hr at 09/10/16 0215      LOS: 2 days   Time spent: 25 minutes.  Hazeline Junkeryan Brinden Kincheloe, MD [facility] Pager [phone]  If 7PM-7AM, please contact night-coverage [email or web address] Password [email or web address] 09/10/2016, 11:33 AM

## 2016-09-11 ENCOUNTER — Encounter (HOSPITAL_COMMUNITY): Payer: Self-pay | Admitting: General Practice

## 2016-09-11 LAB — CBC
HCT: 37.7 % — ABNORMAL LOW (ref 39.0–52.0)
Hemoglobin: 12.2 g/dL — ABNORMAL LOW (ref 13.0–17.0)
MCH: 29.5 pg (ref 26.0–34.0)
MCHC: 32.4 g/dL (ref 30.0–36.0)
MCV: 91.3 fL (ref 78.0–100.0)
Platelets: 262 10*3/uL (ref 150–400)
RBC: 4.13 MIL/uL — ABNORMAL LOW (ref 4.22–5.81)
RDW: 12.1 % (ref 11.5–15.5)
WBC: 11.4 10*3/uL — ABNORMAL HIGH (ref 4.0–10.5)

## 2016-09-11 MED ORDER — PANTOPRAZOLE SODIUM 40 MG PO TBEC
40.0000 mg | DELAYED_RELEASE_TABLET | Freq: Every day | ORAL | Status: DC
  Administered 2016-09-11 – 2016-09-13 (×3): 40 mg via ORAL
  Filled 2016-09-11 (×3): qty 1

## 2016-09-11 MED ORDER — MORPHINE SULFATE (PF) 4 MG/ML IV SOLN
4.0000 mg | Freq: Once | INTRAVENOUS | Status: AC
  Administered 2016-09-11: 4 mg via INTRAVENOUS
  Filled 2016-09-11: qty 1

## 2016-09-11 NOTE — Progress Notes (Signed)
Pt c/o of back and abdominal pain, similar episode occurred today in the afternoon. Pt stated back pain is new. Schorr, Np paged at [phone].

## 2016-09-11 NOTE — Progress Notes (Signed)
INTERIM PROGRESS NOTE:   Text paged by RN concerning temp 100.2, shaking. On arrival he is sitting up in bed and reports he was in the shower, noticed the IV was falling out of his arm, had infiltrated. At that time he was experiencing some back pain, unusual for him, but no change in abdominal pain. He felt cold and denies fevers.   Vitals reported:  100.23F oral, HR 86, BP 153/89, 100% on room air.  On exam he is in pain but nontoxic.  Left AC without IV, with swelling without ecchymosis/erythema.  Regular, not tachycardic, nonlabored, clear breath sounds. Abdomen still very tender in LLQ without rebound. Nondistended. Has voluntary guarding throughout. Altogether unchanged from previous exams.   A&P:  50yo here with acute on chronic diverticulitis on IV zosyn with generally slowly improving condition now with infiltrated IV and low grade temperature. No tachycardia/hypotension. With unchanged abdominal exam, will continue current plan of care, get new IV, continue zosyn.  - Vital signs q5630min x4 - If any instability would get STAT CT, transfer to SDU, call general surgery.  - RN informed of plan, will communicate vital signs as available and inform me of any changes over next 2 hours.   Hazeline Junkeryan Evangelyn Crouse, MD 09/11/2016 7:08 PM

## 2016-09-11 NOTE — Progress Notes (Signed)
Subjective: Slightly better.  Pain medication is helping to control his pain.  Objective: Vital signs in last 24 hours: Temp:  [98.4 F (36.9 C)-98.5 F (36.9 C)] 98.5 F (36.9 C) (12/05 16100613) Pulse Rate:  [72-74] 72 (12/05 0613) Resp:  [20] 20 (12/05 0613) BP: (122-131)/(69-82) 131/69 (12/05 0613) SpO2:  [99 %] 99 % (12/05 96040613) Last BM Date: 09/10/16  Intake/Output from previous day: No intake/output data recorded. Intake/Output this shift: Total I/O In: 480 [P.O.:480] Out: -   General appearance: alert and no distress GI: obese, soft, very tender in the LLQ  Lab Results:  Recent Labs  09/09/16 0451 09/10/16 0619 09/11/16 0514  WBC 14.6* 11.8* 11.4*  HGB 13.4 13.0 12.2*  HCT 40.0 39.5 37.7*  PLT 235 234 262   BMET  Recent Labs  09/09/16 0451 09/10/16 0619  NA 136 138  K 4.0 4.2  CL 102 101  CO2 28 27  GLUCOSE 115* 121*  BUN 12 9  CREATININE 1.11 0.99  CALCIUM 8.8* 8.9   LFT  Recent Labs  09/09/16 0451  PROT 5.7*  ALBUMIN 3.4*  AST 13*  ALT 18  ALKPHOS 61  BILITOT 0.7   PT/INR  Recent Labs  09/09/16 0451  LABPROT 15.0  INR 1.17   Hepatitis Panel No results for input(s): HEPBSAG, HCVAB, HEPAIGM, HEPBIGM in the last 72 hours. C-Diff No results for input(s): CDIFFTOX in the last 72 hours. Fecal Lactopherrin No results for input(s): FECLLACTOFRN in the last 72 hours.  Studies/Results: No results found.  Medications:  Scheduled: . aspirin  81 mg Oral Daily  . enoxaparin (LOVENOX) injection  40 mg Subcutaneous Q24H  . lisinopril  20 mg Oral Daily  . nicotine  21 mg Transdermal Daily  . pantoprazole (PROTONIX) IV  40 mg Intravenous Q24H  . piperacillin-tazobactam (ZOSYN)  IV  3.375 g Intravenous Q8H  . simvastatin  20 mg Oral q1800   Continuous: . sodium chloride 100 mL/hr at 09/11/16 0018    Assessment/Plan: 1) Persistent acute diverticulitis. 2) LLQ pain.   He is stable and progressing slowly.  If continues to improve it  may be prudent to use Augmentin upon discharge as he flared while taking Cipro and Flagyl.  No need for IV Protonix.  He can be changed to oral.  LOS: 3 days   Emberley Kral D 09/11/2016, 12:21 PM

## 2016-09-11 NOTE — Progress Notes (Signed)
PROGRESS NOTE  Eric BurDonald J Forstrom  UJW:[id number] DOB: 1965/12/28 DOA: 09/08/2016 PCP: Egbert GaribaldiMillsaps, KIMBERLY M, NP  Outpatient Specialists: GI, Dr. Loreta AveMann  Brief Narrative: Eric Savage is a 50 y.o. male  with a history of diverticulitis followed by GI, presented 12/2 with significant low abdominal pain with acute diverticulitis seen on CT scan and leukocytosis. Labs at GI 11/28 showed no leukocytosis. He's continued a regular diet and taken cipro/flagyl intermittently since in September with improvement but no resolution. He was admitted for IV antibiotics and fluids for management of recurrent sigmoid diverticulitis.   Assessment & Plan: Active Problems:   Essential hypertension   Dyslipidemia, goal LDL below 70   Smoker   Diverticulitis   CAD (coronary artery disease)   Acute on chronic diverticulitis: Outpt abx (cipro/flagyl) effective with recurrence of symptoms since Sept 2017. CT abd/pelvis 09/08/16 showed acute sigmoid diverticulitis without abscess or perforation. Appendix normal. WBC 16.7 - Leukocytosis improving slowly. Continue IVF's - Continue clear liquids - Continue IV zosyn - Continue toradol and home regimen of hydrocodone 10/325mg  q4h prn, he has a history of cocaine abuse, so will monitor this closely. Senokot for constipation. - Continue antiemetics prn. - General surgery planning elective colectomy once medically resolved.  - Had colonoscopy scheduled with Dr. Loreta AveMann 12/18 who has seen the patient.  - Monitor fever curve and cultures.   Hypertension: Chronic, stable.   - Continue home anti-hypertensive medications   Hyperlipidemia: Chronic, stable - Continue home statin  Tobacco abuse: Precontemplative. - Nicotine patch   - Counseled cessation  CAD: Medically managed, cath 2014 with residual 50-60%. No evidence of ACS - Continue meds   DVT prophylaxis: Lovenox Code Status: Full Family Communication: None at bedside this AM.  Disposition Plan: Continue IV  abx and IVF with clear liquids.   Consultants:   GI, Dr. Loreta AveMann  General surgery  Procedures:   None  Antimicrobials:  Zosyn 12/2 >>    Subjective: Pain improving only very little, controlled with medications. Had small BM yesterday. No fevers.  Objective: Vitals:   09/10/16 0546 09/10/16 0602 09/10/16 2146 09/11/16 0613  BP: 118/76 112/68 122/82 131/69  Pulse: 62 (!) 56 74 72  Resp: 18 18 20 20   Temp: 97.9 F (36.6 C) 98.6 F (37 C) 98.4 F (36.9 C) 98.5 F (36.9 C)  TempSrc: Oral  Oral Oral  SpO2: 97% 99% 99% 99%  Weight:      Height:       No intake or output data in the 24 hours ending 09/11/16 0936 Filed Weights   09/08/16 1430  Weight: 77.6 kg (171 lb)    Examination: General exam: 50 y.o. male in no distress Respiratory system: Non-labored breathing room air. Clear to auscultation bilaterally.  Cardiovascular system: Regular rate and rhythm. No murmur, rub, or gallop. No JVD, and no pedal edema. Gastrointestinal system: Abdomen soft, tender below umbilicus and LLQ without rebound, non-distended, with normoactive bowel sounds. No organomegaly or masses felt. Central nervous system: Alert and oriented. No focal neurological deficits. Extremities: Warm, no deformities Skin: No rashes, lesions or ulcers. No abdominal scars noted. Psychiatry: Judgement and insight appear normal. Mood & affect appropriate.   Data Reviewed: I have personally reviewed following labs and imaging studies  CBC:  Recent Labs Lab 09/08/16 0710 09/09/16 0451 09/10/16 0619 09/11/16 0514  WBC 16.7* 14.6* 11.8* 11.4*  HGB 14.8 13.4 13.0 12.2*  HCT 43.9 40.0 39.5 37.7*  MCV 89.4 90.3 91.0 91.3  PLT 283 235 234  262   Basic Metabolic Panel:  Recent Labs Lab 09/08/16 0710 09/09/16 0451 09/10/16 0619  NA 133* 136 138  K 4.6 4.0 4.2  CL 99* 102 101  CO2 25 28 27   GLUCOSE 126* 115* 121*  BUN 15 12 9   CREATININE 1.06 1.11 0.99  CALCIUM 9.2 8.8* 8.9   GFR: Estimated  Creatinine Clearance: 85.7 mL/min (by C-G formula based on SCr of 0.99 mg/dL). Liver Function Tests:  Recent Labs Lab 09/08/16 0710 09/09/16 0451  AST 18 13*  ALT 24 18  ALKPHOS 81 61  BILITOT 0.6 0.7  PROT 6.1* 5.7*  ALBUMIN 3.8 3.4*    Recent Labs Lab 09/08/16 0710  LIPASE 15   Coagulation Profile:  Recent Labs Lab 09/09/16 0451  INR 1.17   Urine analysis:    Component Value Date/Time   COLORURINE YELLOW 09/08/2016 0725   APPEARANCEUR CLEAR 09/08/2016 0725   LABSPEC 1.023 09/08/2016 0725   PHURINE 5.0 09/08/2016 0725   GLUCOSEU NEGATIVE 09/08/2016 0725   HGBUR NEGATIVE 09/08/2016 0725   BILIRUBINUR NEGATIVE 09/08/2016 0725   KETONESUR NEGATIVE 09/08/2016 0725   PROTEINUR NEGATIVE 09/08/2016 0725   UROBILINOGEN 1.0 05/16/2012 1702   NITRITE NEGATIVE 09/08/2016 0725   LEUKOCYTESUR NEGATIVE 09/08/2016 0725   Recent Results (from the past 240 hour(s))  Urine culture     Status: Abnormal   Collection Time: 09/08/16  7:25 AM  Result Value Ref Range Status   Specimen Description URINE, CLEAN CATCH  Final   Special Requests NONE  Final   Culture <10,000 COLONIES/mL INSIGNIFICANT GROWTH (A)  Final   Report Status 09/09/2016 FINAL  Final  Culture, blood (Routine X 2) w Reflex to ID Panel     Status: None (Preliminary result)   Collection Time: 09/08/16  1:40 PM  Result Value Ref Range Status   Specimen Description BLOOD RIGHT ANTECUBITAL  Final   Special Requests BOTTLES DRAWN AEROBIC AND ANAEROBIC 10CC  Final   Culture NO GROWTH 2 DAYS  Final   Report Status PENDING  Incomplete  Culture, blood (Routine X 2) w Reflex to ID Panel     Status: None (Preliminary result)   Collection Time: 09/08/16  1:46 PM  Result Value Ref Range Status   Specimen Description BLOOD RIGHT HAND  Final   Special Requests BOTTLES DRAWN AEROBIC AND ANAEROBIC 5CC  Final   Culture NO GROWTH 2 DAYS  Final   Report Status PENDING  Incomplete     Radiology Studies: No results  found.  Scheduled Meds: . aspirin  81 mg Oral Daily  . enoxaparin (LOVENOX) injection  40 mg Subcutaneous Q24H  . lisinopril  20 mg Oral Daily  . nicotine  21 mg Transdermal Daily  . pantoprazole (PROTONIX) IV  40 mg Intravenous Q24H  . piperacillin-tazobactam (ZOSYN)  IV  3.375 g Intravenous Q8H  . simvastatin  20 mg Oral q1800   Continuous Infusions: . sodium chloride 100 mL/hr at 09/11/16 0018     LOS: 3 days   Time spent: 25 minutes.  Hazeline Junkeryan Grunz, MD [facility] Pager [phone]  If 7PM-7AM, please contact night-coverage [email or web address] Password [email or web address] 09/11/2016, 9:36 AM

## 2016-09-11 NOTE — Progress Notes (Signed)
Patient ID: Eric Savage, male   DOB: Mar 04, 1966, 50 y.o.   MRN: [id number]  [facility]     Subjective: Patient reports persistent LLQ abdominal pain this morning, maybe slightly better than yesterday. Denies n/v.  Objective: Vital signs in last 24 hours: Temp:  [98.4 F (36.9 C)-98.5 F (36.9 C)] 98.5 F (36.9 C) (12/05 40980613) Pulse Rate:  [72-74] 72 (12/05 0613) Resp:  [20] 20 (12/05 0613) BP: (122-131)/(69-82) 131/69 (12/05 0613) SpO2:  [99 %] 99 % (12/05 11910613) Last BM Date: 09/10/16  Intake/Output from previous day: No intake/output data recorded. Intake/Output this shift: No intake/output data recorded.  PE: Gen:  Alert, NAD, pleasant Card:  RRR, no M/G/R heard Pulm:  CTAB, no W/R/R Abd: Soft, ND, +BS, no HSM, mild LLQ and moderate suprapubic TTP Ext:  No erythema, edema, or tenderness   Lab Results:   Recent Labs  09/10/16 0619 09/11/16 0514  WBC 11.8* 11.4*  HGB 13.0 12.2*  HCT 39.5 37.7*  PLT 234 262   BMET  Recent Labs  09/09/16 0451 09/10/16 0619  NA 136 138  K 4.0 4.2  CL 102 101  CO2 28 27  GLUCOSE 115* 121*  BUN 12 9  CREATININE 1.11 0.99  CALCIUM 8.8* 8.9   PT/INR  Recent Labs  09/09/16 0451  LABPROT 15.0  INR 1.17   CMP     Component Value Date/Time   NA 138 09/10/2016 0619   K 4.2 09/10/2016 0619   CL 101 09/10/2016 0619   CO2 27 09/10/2016 0619   GLUCOSE 121 (H) 09/10/2016 0619   BUN 9 09/10/2016 0619   CREATININE 0.99 09/10/2016 0619   CALCIUM 8.9 09/10/2016 0619   PROT 5.7 (L) 09/09/2016 0451   ALBUMIN 3.4 (L) 09/09/2016 0451   AST 13 (L) 09/09/2016 0451   ALT 18 09/09/2016 0451   ALKPHOS 61 09/09/2016 0451   BILITOT 0.7 09/09/2016 0451   GFRNONAA >60 09/10/2016 0619   GFRAA >60 09/10/2016 0619   Lipase     Component Value Date/Time   LIPASE 15 09/08/2016 0710       Studies/Results: No results found.  Anti-infectives: Anti-infectives    Start     Dose/Rate Route  Frequency Ordered Stop   09/08/16 1800  piperacillin-tazobactam (ZOSYN) IVPB 3.375 g     3.375 g 12.5 mL/hr over 240 Minutes Intravenous Every 8 hours 09/08/16 1500     09/08/16 1345  piperacillin-tazobactam (ZOSYN) IVPB 3.375 g     3.375 g 100 mL/hr over 30 Minutes Intravenous  Once 09/08/16 1331 09/08/16 1419   09/08/16 1215  piperacillin-tazobactam (ZOSYN) IVPB 3.375 g     3.375 g 100 mL/hr over 30 Minutes Intravenous  Once 09/08/16 1200 09/08/16 1331       Assessment/Plan Acute sigmoid diverticulitis - 15 year history of 1-2x/year flare-ups of diverticulitis treated conservatively - since September he has been on/off cipro/flagyl without resolution of his symptoms - CT 09/08/16 showed acute diverticulitis of the sigmoid colon, no discrete diverticular abscess or signs of frank perforation are noted at this time - has been on zosyn since admission with some resolution of his symptoms - WBC trending down 11.4, afebrile - hope to resolve this medically and plan elective colectomy in several weeks  CAD s/p stenting 2010 - followed by Dr. Herbie BaltimoreHarding. Echo 05/13/2009 EF >55% HTN HLD GERD Tobacco abuse  ID - zosyn 12/2>> VTE - lovenox, SCDs FEN - clear liquids  Plan - WBC continues  to slowly trend down. Abdominal exam about the same as yesterday. continue clear liquid diet for now, and IV antibiotics.    LOS: 3 days    Edson SnowballBROOKE A MILLER , [facility] 09/11/2016, 8:20 AM Pager: [phone] Consults: [phone] Mon-Fri 7:00 am-4:30 pm Sat-Sun 7:00 am-11:30 am

## 2016-09-11 NOTE — Progress Notes (Signed)
Pharmacy Antibiotic Note  Eric Savage is a 50 y.o. male admitted on 09/08/2016 with intra-abdominal.  Pharmacy consulted for zosyn dosing. 50 yo with a hx diverticulitis who was admitted for diverticulitis again today. He has been on/off abx due to this issue. CT 09/08/16 showed acute diverticulitis of the sigmoid colon. WBC trending down, afebrile. Plan is to resolve medically and for outpatient colectomy in several weeks.  Plan: Continue Zosyn 3.375g IV q8h Monitor clinical progress, cultures/sensitivities, renal function, abx plan/length of treatment.   Height: 5\' 5"  (165.1 cm) Weight: 171 lb (77.6 kg) IBW/kg (Calculated) : 61.5  Temp (24hrs), Avg:98.5 F (36.9 C), Min:98.4 F (36.9 C), Max:98.5 F (36.9 C)   Recent Labs Lab 09/08/16 0710 09/09/16 0451 09/10/16 0619 09/11/16 0514  WBC 16.7* 14.6* 11.8* 11.4*  CREATININE 1.06 1.11 0.99  --     Estimated Creatinine Clearance: 85.7 mL/min (by C-G formula based on SCr of 0.99 mg/dL).    No Known Allergies  Antimicrobials this admission: 12/2 zosyn >>  Dose adjustments this admission:  Microbiology results: 12/2 BCx: ngtd 12/2 UCx: <10,000 insignificant growth Sputum:  MRSA PCR:    Thank you for allowing us to participate in this patients care. Signe Coltonya C Toney Lizaola, PharmD Pager: [phone] 09/11/2016 10:56 AM

## 2016-09-12 ENCOUNTER — Inpatient Hospital Stay (HOSPITAL_COMMUNITY): Payer: Commercial Managed Care - HMO

## 2016-09-12 DIAGNOSIS — K5732 Diverticulitis of large intestine without perforation or abscess without bleeding: Secondary | ICD-10-CM

## 2016-09-12 DIAGNOSIS — E785 Hyperlipidemia, unspecified: Secondary | ICD-10-CM

## 2016-09-12 DIAGNOSIS — F17200 Nicotine dependence, unspecified, uncomplicated: Secondary | ICD-10-CM

## 2016-09-12 DIAGNOSIS — I10 Essential (primary) hypertension: Secondary | ICD-10-CM

## 2016-09-12 LAB — BASIC METABOLIC PANEL
Anion gap: 9 (ref 5–15)
BUN: 5 mg/dL — ABNORMAL LOW (ref 6–20)
CO2: 28 mmol/L (ref 22–32)
Calcium: 8.9 mg/dL (ref 8.9–10.3)
Chloride: 103 mmol/L (ref 101–111)
Creatinine, Ser: 0.93 mg/dL (ref 0.61–1.24)
GFR calc Af Amer: >60 mL/min (ref >60)
GFR calc non Af Amer: >60 mL/min (ref >60)
Glucose, Bld: 107 mg/dL — ABNORMAL HIGH (ref 65–99)
Potassium: 3.8 mmol/L (ref 3.5–5.1)
Sodium: 140 mmol/L (ref 135–145)

## 2016-09-12 LAB — CBC
HCT: 36 % — ABNORMAL LOW (ref 39.0–52.0)
Hemoglobin: 11.6 g/dL — ABNORMAL LOW (ref 13.0–17.0)
MCH: 29.5 pg (ref 26.0–34.0)
MCHC: 32.2 g/dL (ref 30.0–36.0)
MCV: 91.6 fL (ref 78.0–100.0)
Platelets: 268 10*3/uL (ref 150–400)
RBC: 3.93 MIL/uL — ABNORMAL LOW (ref 4.22–5.81)
RDW: 12.1 % (ref 11.5–15.5)
WBC: 12.6 10*3/uL — ABNORMAL HIGH (ref 4.0–10.5)

## 2016-09-12 MED ORDER — IOPAMIDOL (ISOVUE-300) INJECTION 61%
INTRAVENOUS | Status: AC
  Administered 2016-09-12: 100 mL
  Filled 2016-09-12: qty 100

## 2016-09-12 MED ORDER — IOPAMIDOL (ISOVUE-300) INJECTION 61%
INTRAVENOUS | Status: AC
  Administered 2016-09-12: 30 mL
  Filled 2016-09-12: qty 30

## 2016-09-12 MED ORDER — SODIUM CHLORIDE 0.9 % IV SOLN
1.0000 g | INTRAVENOUS | Status: DC
  Administered 2016-09-12 – 2016-09-13 (×2): 1 g via INTRAVENOUS
  Filled 2016-09-12 (×3): qty 1

## 2016-09-12 NOTE — Progress Notes (Signed)
Pharmacy Antibiotic Note  Eric Savage is a 50 y.o. male admitted on 09/08/2016 with intra-abdominal.  Pharmacy consulted for zosyn dosing. He continues on zosyn. Pt is afebrile and WBC is elevated at 12.6. Scr is WNL and stable.   Plan: Continue Zosyn 3.375g IV q8h Monitor clinical progress, cultures/sensitivities, renal function, abx plan/length of treatment. *Pharmacy will sign off as no dose adjustments are anticipated. Thank you for the consult!   Height: 5\' 5"  (165.1 cm) Weight: 171 lb (77.6 kg) IBW/kg (Calculated) : 61.5  Temp (24hrs), Avg:98.4 F (36.9 C), Min:98.2 F (36.8 C), Max:98.7 F (37.1 C)   Recent Labs Lab 09/08/16 0710 09/09/16 0451 09/10/16 0619 09/11/16 0514 09/12/16 0505  WBC 16.7* 14.6* 11.8* 11.4* 12.6*  CREATININE 1.06 1.11 0.99  --  0.93    Estimated Creatinine Clearance: 91.3 mL/min (by C-G formula based on SCr of 0.93 mg/dL).    No Known Allergies  Antimicrobials this admission: 12/2 zosyn >>  Dose adjustments this admission:  Microbiology results: 12/2 BCx: ngtd 12/2 UCx: <10,000 insignificant growth Sputum:  MRSA PCR:   Eric Savage, PharmD, BCPS Pager # [phone] 09/12/2016 1:03 PM

## 2016-09-12 NOTE — Progress Notes (Signed)
Patient ID: Eric Savage, male   DOB: 16-Jan-1966, 50 y.o.   MRN: [id number]  CT reviewed with MD: Significant sigmoid diverticulitis with slight increase in the degree of pericolonic inflammatory change. Again no evidence of perforation or abscess formation is noted. Will change antibiotic to Invanz.  Eric Savage A MILLER

## 2016-09-12 NOTE — Progress Notes (Signed)
PROGRESS NOTE  Eric Savage WJX:[id number] DOB: April 14, 1966 DOA: 09/08/2016 PCP: [name], NP  Brief History:  50 year old male with a history of hypertension, CAD, diverticulitis, tobacco abuse presents with increasing left lower quadrant pain. Patient has been followed by his gastroenterologist, Dr. Loreta Ave, and has been seen almost weekly for the past 3-4 weeks for diverticulitis. The patient has been taking Cipro Floxin and metronidazole as well as hydrocodone for pain relief. The patient woke up with increasing left lower quadrant pain. He decided to take some hydrocodone and went back to sleep. He woke up on the morning of 09/08/2016 with worsening pain. As result, he presented to the hospital for further evaluation. CT of the abdomen and pelvis revealed wall thickening of the proximal sigmoid colon with pericolonic inflammatory changes suggestive of acute diverticulitis. The patient was started on intravenous fluids and intravenous antibiotics. GI and general surgery were consulted. The patient did show clinical improvement. However, on the evening of 09/11/16, he had more abdominal pain and  WBC count  increased on 09/12/2016 with persistent abdominal pain. 09/12/16 repeat CT of abdomen and pelvis was performed and showed slight increase of wall thickening in the sigmoid colon. There was no abscess or perforation.  General surgery followed the patient and started the patient on ertapenem.  Assessment/Plan: Acute diverticulitis -Continue clear liquids -start ertapenem per general surgery recommendation -am CBC -continue IVF -09/12/16--CT abdomen pelvis--significant wall thickening proximal and mid sigmoid colon with slight increase in pericolonic inflammatory changes  Coronary artery disease -No anginal symptoms -Continue aspirin -RCA/CFX DES 12/10 with residual 50-60% LAD.- Cath 05/19/12- and 01/28/13 no ISR- med Rx  Essential hypertension -Continue lisinopril -BP  elevated in part due to pain  Tobacco abuse -cessation discussed  HLD -continue statin  Impaired glucose tolerance -09/10/16 A1C 5.6  Disposition Plan:   Not stable for d/c Family Communication:   Wife updated at bedside 12/6--Total time spent 35 minutes.  Greater than 50% spent face to face counseling and coordinating care.   Consultants: GI--Dr. Elnoria Howard; General surgery   Code Status:  FULL  DVT Prophylaxis:  [place] Heparin   Procedures: As Listed in Progress Note Above  Antibiotics: Zosyn 12/2>>12/6 Ertapenem 12/6>>>>   Subjective: Patient states that he had increased abdominal pain last evening requiring IV morphine. His pain is not much improved from the first day. Denies any fevers, chills, chest pain, shortness of breath, nausea, vomiting, diarrhea. Denies any hematochezia or melena. No dysuria or hematuria.  Objective: Vitals:   09/11/16 2152 09/12/16 0517 09/12/16 0924 09/12/16 1406  BP: (!) 144/84 113/65 122/73 (!) 149/80  Pulse: 78 67  69  Resp: 18 20  20   Temp: 98.6 F (37 C) 98.7 F (37.1 C)  98.2 F (36.8 C)  TempSrc:  Oral  Oral  SpO2: 100% 99%  100%  Weight:      Height:        Intake/Output Summary (Last 24 hours) at 09/12/16 1446 Last data filed at 09/12/16 1300  Gross per 24 hour  Intake          5001.33 ml  Output                0 ml  Net          5001.33 ml   Weight change:  Exam:   General:  Pt is alert, follows commands appropriately, not in acute distress  HEENT: No icterus, No thrush, No  neck mass, /AT  Cardiovascular: RRR, S1/S2, no rubs, no gallops  Respiratory: CTA bilaterally, no wheezing, no crackles, no rhonchi  Abdomen: Soft/+BS, LLQ, non distended, no rebound; no peritoneal signs  Extremities: No edema, No lymphangitis, No petechiae, No rashes, no synovitis   Data Reviewed: I have personally reviewed following labs and imaging studies Basic Metabolic Panel:  Recent Labs Lab 09/08/16 0710 09/09/16 0451  09/10/16 0619 09/12/16 0505  NA 133* 136 138 140  K 4.6 4.0 4.2 3.8  CL 99* 102 101 103  CO2 25 28 27 28   GLUCOSE 126* 115* 121* 107*  BUN 15 12 9  5*  CREATININE 1.06 1.11 0.99 0.93  CALCIUM 9.2 8.8* 8.9 8.9   Liver Function Tests:  Recent Labs Lab 09/08/16 0710 09/09/16 0451  AST 18 13*  ALT 24 18  ALKPHOS 81 61  BILITOT 0.6 0.7  PROT 6.1* 5.7*  ALBUMIN 3.8 3.4*    Recent Labs Lab 09/08/16 0710  LIPASE 15   No results for input(s): AMMONIA in the last 168 hours. Coagulation Profile:  Recent Labs Lab 09/09/16 0451  INR 1.17   CBC:  Recent Labs Lab 09/08/16 0710 09/09/16 0451 09/10/16 0619 09/11/16 0514 09/12/16 0505  WBC 16.7* 14.6* 11.8* 11.4* 12.6*  HGB 14.8 13.4 13.0 12.2* 11.6*  HCT 43.9 40.0 39.5 37.7* 36.0*  MCV 89.4 90.3 91.0 91.3 91.6  PLT 283 235 234 262 268   Cardiac Enzymes: No results for input(s): CKTOTAL, CKMB, CKMBINDEX, TROPONINI in the last 168 hours. BNP: Invalid input(s): POCBNP CBG: No results for input(s): GLUCAP in the last 168 hours. HbA1C: No results for input(s): HGBA1C in the last 72 hours. Urine analysis:    Component Value Date/Time   COLORURINE YELLOW 09/08/2016 0725   APPEARANCEUR CLEAR 09/08/2016 0725   LABSPEC 1.023 09/08/2016 0725   PHURINE 5.0 09/08/2016 0725   GLUCOSEU NEGATIVE 09/08/2016 0725   HGBUR NEGATIVE 09/08/2016 0725   BILIRUBINUR NEGATIVE 09/08/2016 0725   KETONESUR NEGATIVE 09/08/2016 0725   PROTEINUR NEGATIVE 09/08/2016 0725   UROBILINOGEN 1.0 05/16/2012 1702   NITRITE NEGATIVE 09/08/2016 0725   LEUKOCYTESUR NEGATIVE 09/08/2016 0725   Sepsis Labs: @LABRCNTIP (procalcitonin:4,lacticidven:4) ) Recent Results (from the past 240 hour(s))  Urine culture     Status: Abnormal   Collection Time: 09/08/16  7:25 AM  Result Value Ref Range Status   Specimen Description URINE, CLEAN CATCH  Final   Special Requests NONE  Final   Culture <10,000 COLONIES/mL INSIGNIFICANT GROWTH (A)  Final   Report  Status 09/09/2016 FINAL  Final  Culture, blood (Routine X 2) w Reflex to ID Panel     Status: None (Preliminary result)   Collection Time: 09/08/16  1:40 PM  Result Value Ref Range Status   Specimen Description BLOOD RIGHT ANTECUBITAL  Final   Special Requests BOTTLES DRAWN AEROBIC AND ANAEROBIC 10CC  Final   Culture NO GROWTH 3 DAYS  Final   Report Status PENDING  Incomplete  Culture, blood (Routine X 2) w Reflex to ID Panel     Status: None (Preliminary result)   Collection Time: 09/08/16  1:46 PM  Result Value Ref Range Status   Specimen Description BLOOD RIGHT HAND  Final   Special Requests BOTTLES DRAWN AEROBIC AND ANAEROBIC 5CC  Final   Culture NO GROWTH 3 DAYS  Final   Report Status PENDING  Incomplete     Scheduled Meds: . aspirin  81 mg Oral Daily  . enoxaparin (LOVENOX) injection  40 mg Subcutaneous  Q24H  . ertapenem  1 g Intravenous Q24H  . lisinopril  20 mg Oral Daily  . nicotine  21 mg Transdermal Daily  . pantoprazole  40 mg Oral Daily  . simvastatin  20 mg Oral q1800   Continuous Infusions: . sodium chloride 100 mL/hr at 09/11/16 1945    Procedures/Studies: X-ray Chest Pa And Lateral  Result Date: 09/09/2016 CLINICAL DATA:  Lower abdominal pain. EXAM: CHEST  2 VIEW COMPARISON:  January 27, 2013 FINDINGS: The heart size and mediastinal contours are within normal limits. Both lungs are clear. The visualized skeletal structures are unremarkable. IMPRESSION: No active cardiopulmonary disease. Electronically Signed   By: Gerome Samavid  Williams III M.D   On: 09/09/2016 09:11   Ct Abdomen Pelvis W Contrast  Result Date: 09/12/2016 CLINICAL DATA:  History of diverticulitis : CT ABDOMEN AND PELVIS WITH CONTRAST TECHNIQUE: Multidetector CT imaging of the abdomen and pelvis was performed using the standard protocol following bolus administration of intravenous contrast. CONTRAST:  100mL ISOVUE-300 IOPAMIDOL (ISOVUE-300) INJECTION 61% COMPARISON:  09/08/2016 FINDINGS: Lower chest: No  acute abnormality. Hepatobiliary: Hemangiomas are again identified within the left lobe of the liver both medially and laterally. No other focal abnormality is seen. The gallbladder is within normal limits. Pancreas: Unremarkable. No pancreatic ductal dilatation or surrounding inflammatory changes. Spleen: Normal in size without focal abnormality. Adrenals/Urinary Tract: The adrenal glands are within normal limits. Left renal cyst is noted and stable. No obstructive changes are seen. Stomach/Bowel: Significant wall thickening and pericolonic inflammatory changes are again noted in the proximal and mid sigmoid colon. Multiple large dilated diverticular are identified with surrounding inflammatory change. No findings to suggest perforation or focal abscess formation are noted at this time. The appendix is within normal limits. Vascular/Lymphatic: Aortic atherosclerosis. No enlarged abdominal or pelvic lymph nodes. Reproductive: Prostate is unremarkable. Other: No abdominal wall hernia or abnormality. No abdominopelvic ascites. Musculoskeletal: No acute or significant osseous findings. IMPRESSION: Significant sigmoid diverticulitis with slight increase in the degree of pericolonic inflammatory change. Again no evidence of perforation or abscess formation is noted. Hemangiomas in the liver similar to that noted on the prior exam. Electronically Signed   By: Alcide CleverMark  Lukens M.D.   On: 09/12/2016 12:22   Ct Abdomen Pelvis W Contrast  Result Date: 09/08/2016 CLINICAL DATA:  50 year old male with history of lower abdominal pain and leukocytosis on antibiotics. Possible recurrent diverticulitis. Some emesis this morning. EXAM: CT ABDOMEN AND PELVIS WITH CONTRAST TECHNIQUE: Multidetector CT imaging of the abdomen and pelvis was performed using the standard protocol following bolus administration of intravenous contrast. CONTRAST:  100mL ISOVUE-300 IOPAMIDOL (ISOVUE-300) INJECTION 61% COMPARISON:  CT the abdomen and pelvis  08/06/2016. FINDINGS: Lower chest: Atherosclerotic calcifications in the right coronary artery. Hepatobiliary: Hypervascular lesions are again noted in segment 4A and 3, measuring up to 2.2 x 2.7 cm in segment 4A (image 11 of series 2) very similar to remote prior examinations dating back to 03/12/2004, most compatible with benign cavernous hemangiomas. No suspicious hepatic lesions are otherwise identified. No intra or extrahepatic biliary ductal dilatation. Gallbladder is normal in appearance. Pancreas: No pancreatic mass. No pancreatic ductal dilatation. No pancreatic or peripancreatic fluid or inflammatory changes. Spleen: Unremarkable. Adrenals/Urinary Tract: 2 low-attenuation lesions in the left kidney measuring up to 2.2 cm in the upper pole are compatible with simple cysts. Right kidney and bilateral adrenal glands are normal in appearance. There is no hydroureteronephrosis or perinephric stranding to indicate urinary tract obstruction at this time. Urinary bladder is normal  in appearance. Stomach/Bowel: Normal appearance of the stomach. There is no pathologic dilatation of small bowel or colon. Numerous colonic diverticulae are noted. Importantly, in the sigmoid colon there is also extensive mural thickening and extensive surrounding inflammatory changes associated with the sigmoid mesocolon, compatible with severe acute diverticulitis. No discrete diverticular abscess is identified. No signs of frank perforation of the bowel. Normal appendix. Vascular/Lymphatic: Aortic atherosclerosis, without evidence of aneurysm or dissection in the abdominal and pelvic vasculature. No lymphadenopathy noted in the abdomen or pelvis. Reproductive: Prostate gland and seminal vesicles are unremarkable in appearance. Other: Trace volume of ascites noted in the low anatomic pelvis, presumably reactive. No larger volume of ascites. No pneumoperitoneum. Musculoskeletal: There are no aggressive appearing lytic or blastic lesions  noted in the visualized portions of the skeleton. IMPRESSION: 1. Acute diverticulitis of the sigmoid colon. No discrete diverticular abscess or signs of frank perforation are noted at this time. 2. Normal appendix. 3. 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 4. Two simple cysts in the left kidney are incidentally noted. 5. Aortic atherosclerosis. Electronically Signed   By: Trudie Reed M.D.   On: 09/08/2016 10:58   Dg Hip Unilat With Pelvis 2-3 Views Right  Result Date: 08/24/2016 CLINICAL DATA:  Onset acute right groin pain. Altered gait. Injury 3 days ago. EXAM: DG HIP (WITH OR WITHOUT PELVIS) 2-3V RIGHT COMPARISON:  None. FINDINGS: Mild degenerative changes in the right hip joint with joint space narrowing and spurring. No acute bony abnormality. Specifically, no fracture, subluxation, or dislocation. Soft tissues are intact. IMPRESSION: Mild degenerative changes.  No acute bony abnormality. Electronically Signed   By: Charlett Nose M.D.   On: 08/24/2016 14:24    Brixton Schnapp, DO  [facility] Pager [phone]  If 7PM-7AM, please contact night-coverage [email or web address] Password [email or web address] 09/12/2016, 2:46 PM   LOS: 4 days

## 2016-09-12 NOTE — Progress Notes (Signed)
Patient ID: Eric Savage, male   DOB: 05/29/1966, 50 y.o.   MRN: [id number]  [facility]     Subjective: Patient reports episode of midline LBP last night in shower; he has a history of back pain and this felt similar to pain he has had in the past. LBP resolved today. No change in abdominal pain at the time. Abdominal pain is persistent this morning, but no worse. BM this morning. WBC up to 12.6, TMAX 100.2 yesterday.  Objective: Vital signs in last 24 hours: Temp:  [98.2 F (36.8 C)-98.7 F (37.1 C)] 98.7 F (37.1 C) (12/06 0517) Pulse Rate:  [67-85] 67 (12/06 0517) Resp:  [18-20] 20 (12/06 0517) BP: (113-149)/(65-86) 113/65 (12/06 0517) SpO2:  [99 %-100 %] 99 % (12/06 0517) Last BM Date: 09/11/16  Intake/Output from previous day: 12/05 0701 - 12/06 0700 In: 5243.3 [P.O.:600; I.V.:4393.3; IV Piggyback:250] Out: -  Intake/Output this shift: No intake/output data recorded.  PE: Gen:  Alert, NAD, pleasant Card:  RRR, no M/G/R heard Pulm:  CTAB, no W/R/R Abd: Soft, ND, +BS, no HSM, moderate LLQ and suprapubic TTP Ext:  No erythema, edema, or tenderness    Lab Results:   Recent Labs  09/11/16 0514 09/12/16 0505  WBC 11.4* 12.6*  HGB 12.2* 11.6*  HCT 37.7* 36.0*  PLT 262 268   BMET  Recent Labs  09/10/16 0619 09/12/16 0505  NA 138 140  K 4.2 3.8  CL 101 103  CO2 27 28  GLUCOSE 121* 107*  BUN 9 5*  CREATININE 0.99 0.93  CALCIUM 8.9 8.9   PT/INR No results for input(s): LABPROT, INR in the last 72 hours. CMP     Component Value Date/Time   NA 140 09/12/2016 0505   K 3.8 09/12/2016 0505   CL 103 09/12/2016 0505   CO2 28 09/12/2016 0505   GLUCOSE 107 (H) 09/12/2016 0505   BUN 5 (L) 09/12/2016 0505   CREATININE 0.93 09/12/2016 0505   CALCIUM 8.9 09/12/2016 0505   PROT 5.7 (L) 09/09/2016 0451   ALBUMIN 3.4 (L) 09/09/2016 0451   AST 13 (L) 09/09/2016 0451   ALT 18 09/09/2016 0451   ALKPHOS 61 09/09/2016 0451   BILITOT  0.7 09/09/2016 0451   GFRNONAA >60 09/12/2016 0505   GFRAA >60 09/12/2016 0505   Lipase     Component Value Date/Time   LIPASE 15 09/08/2016 0710       Studies/Results: No results found.  Anti-infectives: Anti-infectives    Start     Dose/Rate Route Frequency Ordered Stop   09/08/16 1800  piperacillin-tazobactam (ZOSYN) IVPB 3.375 g     3.375 g 12.5 mL/hr over 240 Minutes Intravenous Every 8 hours 09/08/16 1500     09/08/16 1345  piperacillin-tazobactam (ZOSYN) IVPB 3.375 g     3.375 g 100 mL/hr over 30 Minutes Intravenous  Once 09/08/16 1331 09/08/16 1419   09/08/16 1215  piperacillin-tazobactam (ZOSYN) IVPB 3.375 g     3.375 g 100 mL/hr over 30 Minutes Intravenous  Once 09/08/16 1200 09/08/16 1331       Assessment/Plan Acute sigmoid diverticulitis - 15 year history of 1-2x/year flare-ups of diverticulitis treated conservatively - since September he has been on/off cipro/flagyl without resolution of his symptoms - CT 09/08/16 showed acute diverticulitis of the sigmoid colon, no discrete diverticular abscess or signs of frank perforation are noted at this time - hope to resolve this medically and plan elective colectomy in several weeks  CAD s/p  stenting 2010 - followed by Dr. Herbie BaltimoreHarding. Echo 05/13/2009 EF >55% HTN HLD GERD Tobacco abuse  ID - zosyn 12/2>> VTE - lovenox, SCDs FEN - clear liquids  Plan - WBC slightly up today, 12.6, TMAX 100.2 yesterday. Persistent LLQ/suprapubic tenderness on exam. Recommend repeat CT scan today.    LOS: 4 days    Edson SnowballBROOKE A MILLER , [facility] 09/12/2016, 7:29 AM Pager: [phone] Consults: [phone] Mon-Fri 7:00 am-4:30 pm Sat-Sun 7:00 am-11:30 am

## 2016-09-13 LAB — CBC
HCT: 37.7 % — ABNORMAL LOW (ref 39.0–52.0)
Hemoglobin: 12.1 g/dL — ABNORMAL LOW (ref 13.0–17.0)
MCH: 29.7 pg (ref 26.0–34.0)
MCHC: 32.1 g/dL (ref 30.0–36.0)
MCV: 92.4 fL (ref 78.0–100.0)
Platelets: 283 10*3/uL (ref 150–400)
RBC: 4.08 MIL/uL — ABNORMAL LOW (ref 4.22–5.81)
RDW: 12.2 % (ref 11.5–15.5)
WBC: 9.9 10*3/uL (ref 4.0–10.5)

## 2016-09-13 LAB — CULTURE, BLOOD (ROUTINE X 2)
Culture: NO GROWTH
Culture: NO GROWTH

## 2016-09-13 LAB — BASIC METABOLIC PANEL
Anion gap: 9 (ref 5–15)
BUN: 5 mg/dL — ABNORMAL LOW (ref 6–20)
CO2: 28 mmol/L (ref 22–32)
Calcium: 9.1 mg/dL (ref 8.9–10.3)
Chloride: 104 mmol/L (ref 101–111)
Creatinine, Ser: 0.95 mg/dL (ref 0.61–1.24)
GFR calc Af Amer: >60 mL/min (ref >60)
GFR calc non Af Amer: >60 mL/min (ref >60)
Glucose, Bld: 98 mg/dL (ref 65–99)
Potassium: 3.8 mmol/L (ref 3.5–5.1)
Sodium: 141 mmol/L (ref 135–145)

## 2016-09-13 LAB — MAGNESIUM: Magnesium: 2 mg/dL (ref 1.7–2.4)

## 2016-09-13 MED ORDER — DOCUSATE SODIUM 100 MG PO CAPS: 100.0000 mg | ORAL_CAPSULE | Freq: Every day | ORAL | Status: DC

## 2016-09-13 MED ORDER — SACCHAROMYCES BOULARDII 250 MG PO CAPS
250.0000 mg | ORAL_CAPSULE | Freq: Two times a day (BID) | ORAL | Status: DC
  Administered 2016-09-13: 250 mg via ORAL
  Filled 2016-09-13 (×2): qty 1

## 2016-09-13 NOTE — Progress Notes (Signed)
Pt refused his iv fluid pt said he had too much fluid yesterday and is making his legs swellon, pt said he wants to talk to his doctor in the morning before he will decide to continue with the iv fluid on call practioner Schoor was made aware of pt non compliance I will continue to monitor pt

## 2016-09-13 NOTE — Progress Notes (Signed)
[facility] Progress Note     Subjective: Abdominal pain has greatly improved. Pt has had diarrhea. No nausea or vomiting. He is feeling much better and is ready to leave.   Objective: Vital signs in last 24 hours: Temp:  [98.2 F (36.8 C)-99.3 F (37.4 C)] 99.3 F (37.4 C) (12/07 0707) Pulse Rate:  [69-72] 71 (12/07 0707) Resp:  [20] 20 (12/07 0707) BP: (121-156)/(73-88) 156/88 (12/07 0707) SpO2:  [96 %-100 %] 100 % (12/07 0707) Last BM Date: 09/12/16  Intake/Output from previous day: 12/06 0701 - 12/07 0700 In: 1063.8 [P.O.:358; I.V.:655.8; IV Piggyback:50] Out: -  Intake/Output this shift: No intake/output data recorded.  PE: Gen:  Alert, NAD, pleasant, cooperative Card:  RRR, no M/G/R heard Pulm:  CTA, effort normal Abd: Soft, ND, +BS, very mild TTP to LLQ and suprapubic region Skin: no rashes noted, warm and dry  Lab Results:   Recent Labs  09/12/16 0505 09/13/16 0427  WBC 12.6* 9.9  HGB 11.6* 12.1*  HCT 36.0* 37.7*  PLT 268 283   BMET  Recent Labs  09/12/16 0505 09/13/16 0427  NA 140 141  K 3.8 3.8  CL 103 104  CO2 28 28  GLUCOSE 107* 98  BUN 5* 5*  CREATININE 0.93 0.95  CALCIUM 8.9 9.1   PT/INR No results for input(s): LABPROT, INR in the last 72 hours. CMP     Component Value Date/Time   NA 141 09/13/2016 0427   K 3.8 09/13/2016 0427   CL 104 09/13/2016 0427   CO2 28 09/13/2016 0427   GLUCOSE 98 09/13/2016 0427   BUN 5 (L) 09/13/2016 0427   CREATININE 0.95 09/13/2016 0427   CALCIUM 9.1 09/13/2016 0427   PROT 5.7 (L) 09/09/2016 0451   ALBUMIN 3.4 (L) 09/09/2016 0451   AST 13 (L) 09/09/2016 0451   ALT 18 09/09/2016 0451   ALKPHOS 61 09/09/2016 0451   BILITOT 0.7 09/09/2016 0451   GFRNONAA >60 09/13/2016 0427   GFRAA >60 09/13/2016 0427   Lipase     Component Value Date/Time   LIPASE 15 09/08/2016 0710       Studies/Results: Ct Abdomen Pelvis W Contrast  Result Date: 09/12/2016 CLINICAL DATA:  History of  diverticulitis : CT ABDOMEN AND PELVIS WITH CONTRAST TECHNIQUE: Multidetector CT imaging of the abdomen and pelvis was performed using the standard protocol following bolus administration of intravenous contrast. CONTRAST:  100mL ISOVUE-300 IOPAMIDOL (ISOVUE-300) INJECTION 61% COMPARISON:  09/08/2016 FINDINGS: Lower chest: No acute abnormality. Hepatobiliary: Hemangiomas are again identified within the left lobe of the liver both medially and laterally. No other focal abnormality is seen. The gallbladder is within normal limits. Pancreas: Unremarkable. No pancreatic ductal dilatation or surrounding inflammatory changes. Spleen: Normal in size without focal abnormality. Adrenals/Urinary Tract: The adrenal glands are within normal limits. Left renal cyst is noted and stable. No obstructive changes are seen. Stomach/Bowel: Significant wall thickening and pericolonic inflammatory changes are again noted in the proximal and mid sigmoid colon. Multiple large dilated diverticular are identified with surrounding inflammatory change. No findings to suggest perforation or focal abscess formation are noted at this time. The appendix is within normal limits. Vascular/Lymphatic: Aortic atherosclerosis. No enlarged abdominal or pelvic lymph nodes. Reproductive: Prostate is unremarkable. Other: No abdominal wall hernia or abnormality. No abdominopelvic ascites. Musculoskeletal: No acute or significant osseous findings. IMPRESSION: Significant sigmoid diverticulitis with slight increase in the degree of pericolonic inflammatory change. Again no evidence of perforation or abscess formation is noted. Hemangiomas in  the liver similar to that noted on the prior exam. Electronically Signed   By: Alcide CleverMark  Lukens M.D.   On: 09/12/2016 12:22    Anti-infectives: Anti-infectives    Start     Dose/Rate Route Frequency Ordered Stop   09/12/16 1700  ertapenem (INVANZ) 1 g in sodium chloride 0.9 % 50 mL IVPB     1 g 100 mL/hr over 30  Minutes Intravenous Every 24 hours 09/12/16 1415     09/08/16 1800  piperacillin-tazobactam (ZOSYN) IVPB 3.375 g  Status:  Discontinued     3.375 g 12.5 mL/hr over 240 Minutes Intravenous Every 8 hours 09/08/16 1500 09/12/16 1415   09/08/16 1345  piperacillin-tazobactam (ZOSYN) IVPB 3.375 g     3.375 g 100 mL/hr over 30 Minutes Intravenous  Once 09/08/16 1331 09/08/16 1419   09/08/16 1215  piperacillin-tazobactam (ZOSYN) IVPB 3.375 g     3.375 g 100 mL/hr over 30 Minutes Intravenous  Once 09/08/16 1200 09/08/16 1331       Assessment/Plan  Acute sigmoid diverticulitis - 15 year history of 1-2x/year flare-ups of diverticulitis treated conservatively - since September he has been on/off cipro/flagyl without resolution of his symptoms - CT 09/08/16 showed acute diverticulitis of the sigmoid colon, no discrete diverticular abscess or signs of frank perforation are noted at this time - hope to resolve this medically and plan elective colectomy in several weeks  CAD s/p stenting 2010 - followed by Dr. Herbie BaltimoreHarding. Echo 05/13/2009 EF >55% HTN HLD GERD Tobacco abuse  ID - zosyn 12/2>>12/06, Invanz 12/06>> VTE - lovenox, SCDs FEN - advanced to fulls  Plan - WBC normal today, 9.9, no fevers overnight. Will advance to fulls. Will likely d/c home tomorrow on PO antibiotics. We will continue to monitor.   LOS: 5 days    Jerre SimonJessica L Albana Saperstein , [facility] 09/13/2016, 8:17 AM Pager: [phone] Consults: [phone] Mon-Fri 7:00 am-4:30 pm Sat-Sun 7:00 am-11:30 am

## 2016-09-13 NOTE — Progress Notes (Signed)
PROGRESS NOTE  Eric Savage NUU:[id number] DOB: 11/12/1965 DOA: 09/08/2016 PCP: Egbert GaribaldiMillsaps, KIMBERLY M, NP  Brief History:  50 year old male with a history of hypertension, CAD, diverticulitis, tobacco abuse presents with increasing left lower quadrant pain. Patient has been followed by his gastroenterologist, Dr. Loreta AveMann, and has been seen almost weekly for the past 3-4 weeks for diverticulitis. The patient has been taking Cipro Floxin and metronidazole as well as hydrocodone for pain relief. The patient woke up with increasing left lower quadrant pain. He decided to take some hydrocodone and went back to sleep. He woke up on the morning of 09/08/2016 with worsening pain. As result, he presented to the hospital for further evaluation. CT of the abdomen and pelvis revealed wall thickening of the proximal sigmoid colon with pericolonic inflammatory changes suggestive of acute diverticulitis. The patient was started on intravenous fluids and intravenous antibiotics. GI and general surgery were consulted. The patient did show clinical improvement. However, on the evening of 09/11/16, he had more abdominal pain and  WBC count  increased on 09/12/2016 with persistent abdominal pain. 09/12/16 repeat CT of abdomen and pelvis was performed and showed slight increase of wall thickening in the sigmoid colon. There was no abscess or perforation.  General surgery followed the patient and started the patient on ertapenem.  Assessment/Plan: Acute diverticulitis -Advance to full liquids 12/7 -start ertapenem per general surgery recommendation-->pain improving and WBC improving -am CBC -continue IVF -09/12/16--CT abdomen pelvis--significant wall thickening proximal and mid sigmoid colon with slight increase in pericolonic inflammatory changes  Coronary artery disease -No anginal symptoms -Continue aspirin -RCA/CFX DES 12/10 with residual 50-60% LAD.- Cath 05/19/12- and 01/28/13 no ISR- med Rx  Essential  hypertension -Continue lisinopril -BP elevated in part due to pain  Tobacco abuse -cessation discussed  HLD -continue statin  Impaired glucose tolerance -09/10/16 A1C 5.6  Disposition Plan:   Home 12/8 or 12/9 Family Communication:   Wife updated at bedside 12/6   Consultants: GI--Dr. Elnoria HowardHung; General surgery   Code Status:  FULL  DVT Prophylaxis:  Ruffin Heparin   Procedures: As Listed in Progress Note Above  Antibiotics: Zosyn 12/2>>12/6 Ertapenem 12/6>>>>    Subjective: Patient feels that abdominal pain is improved since changing antibiotics. He is tolerating full liquids today. Denies any nausea, vomiting, diarrhea. He feels constipated. Denies any headache, neck pain.  Objective: Vitals:   09/12/16 2127 09/13/16 0707 09/13/16 1039 09/13/16 1417  BP: 121/75 (!) 156/88 140/83 (!) 146/90  Pulse: 72 71  71  Resp: 20 20  16   Temp: 99.3 F (37.4 C) 99.3 F (37.4 C)  98.7 F (37.1 C)  TempSrc: Oral Oral  Oral  SpO2: 96% 100%  99%  Weight:      Height:        Intake/Output Summary (Last 24 hours) at 09/13/16 1814 Last data filed at 09/13/16 1416  Gross per 24 hour  Intake          1185.83 ml  Output                0 ml  Net          1185.83 ml   Weight change:  Exam:   General:  Pt is alert, follows commands appropriately, not in acute distress  HEENT: No icterus, No thrush, No neck mass, [place]/AT  Cardiovascular: RRR, S1/S2, no rubs, no gallops  Respiratory: Diminished breath sounds at the bases but clear to auscultation.  Abdomen: Soft/+BS, non tender, non distended, no guarding  Extremities: No edema, No lymphangitis, No petechiae, No rashes, no synovitis   Data Reviewed: I have personally reviewed following labs and imaging studies Basic Metabolic Panel:  Recent Labs Lab 09/08/16 0710 09/09/16 0451 09/10/16 0619 09/12/16 0505 09/13/16 0427  NA 133* 136 138 140 141  K 4.6 4.0 4.2 3.8 3.8  CL 99* 102 101 103 104  CO2 25 28 27 28  28   GLUCOSE 126* 115* 121* 107* 98  BUN 15 12 9  5* 5*  CREATININE 1.06 1.11 0.99 0.93 0.95  CALCIUM 9.2 8.8* 8.9 8.9 9.1  MG  --   --   --   --  2.0   Liver Function Tests:  Recent Labs Lab 09/08/16 0710 09/09/16 0451  AST 18 13*  ALT 24 18  ALKPHOS 81 61  BILITOT 0.6 0.7  PROT 6.1* 5.7*  ALBUMIN 3.8 3.4*    Recent Labs Lab 09/08/16 0710  LIPASE 15   No results for input(s): AMMONIA in the last 168 hours. Coagulation Profile:  Recent Labs Lab 09/09/16 0451  INR 1.17   CBC:  Recent Labs Lab 09/09/16 0451 09/10/16 0619 09/11/16 0514 09/12/16 0505 09/13/16 0427  WBC 14.6* 11.8* 11.4* 12.6* 9.9  HGB 13.4 13.0 12.2* 11.6* 12.1*  HCT 40.0 39.5 37.7* 36.0* 37.7*  MCV 90.3 91.0 91.3 91.6 92.4  PLT 235 234 262 268 283   Cardiac Enzymes: No results for input(s): CKTOTAL, CKMB, CKMBINDEX, TROPONINI in the last 168 hours. BNP: Invalid input(s): POCBNP CBG: No results for input(s): GLUCAP in the last 168 hours. HbA1C: No results for input(s): HGBA1C in the last 72 hours. Urine analysis:    Component Value Date/Time   COLORURINE YELLOW 09/08/2016 0725   APPEARANCEUR CLEAR 09/08/2016 0725   LABSPEC 1.023 09/08/2016 0725   PHURINE 5.0 09/08/2016 0725   GLUCOSEU NEGATIVE 09/08/2016 0725   HGBUR NEGATIVE 09/08/2016 0725   BILIRUBINUR NEGATIVE 09/08/2016 0725   KETONESUR NEGATIVE 09/08/2016 0725   PROTEINUR NEGATIVE 09/08/2016 0725   UROBILINOGEN 1.0 05/16/2012 1702   NITRITE NEGATIVE 09/08/2016 0725   LEUKOCYTESUR NEGATIVE 09/08/2016 0725   Sepsis Labs: @LABRCNTIP (procalcitonin:4,lacticidven:4) ) Recent Results (from the past 240 hour(s))  Urine culture     Status: Abnormal   Collection Time: 09/08/16  7:25 AM  Result Value Ref Range Status   Specimen Description URINE, CLEAN CATCH  Final   Special Requests NONE  Final   Culture <10,000 COLONIES/mL INSIGNIFICANT GROWTH (A)  Final   Report Status 09/09/2016 FINAL  Final  Culture, blood (Routine X 2) w  Reflex to ID Panel     Status: None   Collection Time: 09/08/16  1:40 PM  Result Value Ref Range Status   Specimen Description BLOOD RIGHT ANTECUBITAL  Final   Special Requests BOTTLES DRAWN AEROBIC AND ANAEROBIC 10CC  Final   Culture NO GROWTH 5 DAYS  Final   Report Status 09/13/2016 FINAL  Final  Culture, blood (Routine X 2) w Reflex to ID Panel     Status: None   Collection Time: 09/08/16  1:46 PM  Result Value Ref Range Status   Specimen Description BLOOD RIGHT HAND  Final   Special Requests BOTTLES DRAWN AEROBIC AND ANAEROBIC 5CC  Final   Culture NO GROWTH 5 DAYS  Final   Report Status 09/13/2016 FINAL  Final     Scheduled Meds: . aspirin  81 mg Oral Daily  . enoxaparin (LOVENOX) injection  40 mg Subcutaneous Q24H  .  ertapenem  1 g Intravenous Q24H  . lisinopril  20 mg Oral Daily  . nicotine  21 mg Transdermal Daily  . pantoprazole  40 mg Oral Daily  . saccharomyces boulardii  250 mg Oral BID  . simvastatin  20 mg Oral q1800   Continuous Infusions: . sodium chloride Stopped (09/12/16 1832)    Procedures/Studies: X-ray Chest Pa And Lateral  Result Date: 09/09/2016 CLINICAL DATA:  Lower abdominal pain. EXAM: CHEST  2 VIEW COMPARISON:  January 27, 2013 FINDINGS: The heart size and mediastinal contours are within normal limits. Both lungs are clear. The visualized skeletal structures are unremarkable. IMPRESSION: No active cardiopulmonary disease. Electronically Signed   By: Gerome Sam III M.D   On: 09/09/2016 09:11   Ct Abdomen Pelvis W Contrast  Result Date: 09/12/2016 CLINICAL DATA:  History of diverticulitis : CT ABDOMEN AND PELVIS WITH CONTRAST TECHNIQUE: Multidetector CT imaging of the abdomen and pelvis was performed using the standard protocol following bolus administration of intravenous contrast. CONTRAST:  ISOVUE-300 IOPAMIDOL (ISOVUE-300) INJECTION 61% COMPARISON:  09/08/2016 FINDINGS: Lower chest: No acute abnormality. Hepatobiliary: Hemangiomas are again  identified within the left lobe of the liver both medially and laterally. No other focal abnormality is seen. The gallbladder is within normal limits. Pancreas: Unremarkable. No pancreatic ductal dilatation or surrounding inflammatory changes. Spleen: Normal in size without focal abnormality. Adrenals/Urinary Tract: The adrenal glands are within normal limits. Left renal cyst is noted and stable. No obstructive changes are seen. Stomach/Bowel: Significant wall thickening and pericolonic inflammatory changes are again noted in the proximal and mid sigmoid colon. Multiple large dilated diverticular are identified with surrounding inflammatory change. No findings to suggest perforation or focal abscess formation are noted at this time. The appendix is within normal limits. Vascular/Lymphatic: Aortic atherosclerosis. No enlarged abdominal or pelvic lymph nodes. Reproductive: Prostate is unremarkable. Other: No abdominal wall hernia or abnormality. No abdominopelvic ascites. Musculoskeletal: No acute or significant osseous findings. IMPRESSION: Significant sigmoid diverticulitis with slight increase in the degree of pericolonic inflammatory change. Again no evidence of perforation or abscess formation is noted. Hemangiomas in the liver similar to that noted on the prior exam. Electronically Signed   By: Alcide Clever M.D.   On: 09/12/2016 12:22   Ct Abdomen Pelvis W Contrast  Result Date: 09/08/2016 CLINICAL DATA:  50 year old male with history of lower abdominal pain and leukocytosis on antibiotics. Possible recurrent diverticulitis. Some emesis this morning. EXAM: CT ABDOMEN AND PELVIS WITH CONTRAST TECHNIQUE: Multidetector CT imaging of the abdomen and pelvis was performed using the standard protocol following bolus administration of intravenous contrast. CONTRAST:  ISOVUE-300 IOPAMIDOL (ISOVUE-300) INJECTION 61% COMPARISON:  CT the abdomen and pelvis 08/06/2016. FINDINGS: Lower chest: Atherosclerotic  calcifications in the right coronary artery. Hepatobiliary: Hypervascular lesions are again noted in segment 4A and 3, measuring up to 2.2 x 2.7 cm in segment 4A (image 11 of series 2) very similar to remote prior examinations dating back to 03/12/2004, most compatible with benign cavernous hemangiomas. No suspicious hepatic lesions are otherwise identified. No intra or extrahepatic biliary ductal dilatation. Gallbladder is normal in appearance. Pancreas: No pancreatic mass. No pancreatic ductal dilatation. No pancreatic or peripancreatic fluid or inflammatory changes. Spleen: Unremarkable. Adrenals/Urinary Tract: 2 low-attenuation lesions in the left kidney measuring up to 2.2 cm in the upper pole are compatible with simple cysts. Right kidney and bilateral adrenal glands are normal in appearance. There is no hydroureteronephrosis or perinephric stranding to indicate urinary tract obstruction at this time.  Urinary bladder is normal in appearance. Stomach/Bowel: Normal appearance of the stomach. There is no pathologic dilatation of small bowel or colon. Numerous colonic diverticulae are noted. Importantly, in the sigmoid colon there is also extensive mural thickening and extensive surrounding inflammatory changes associated with the sigmoid mesocolon, compatible with severe acute diverticulitis. No discrete diverticular abscess is identified. No signs of frank perforation of the bowel. Normal appendix. Vascular/Lymphatic: Aortic atherosclerosis, without evidence of aneurysm or dissection in the abdominal and pelvic vasculature. No lymphadenopathy noted in the abdomen or pelvis. Reproductive: Prostate gland and seminal vesicles are unremarkable in appearance. Other: Trace volume of ascites noted in the low anatomic pelvis, presumably reactive. No larger volume of ascites. No pneumoperitoneum. Musculoskeletal: There are no aggressive appearing lytic or blastic lesions noted in the visualized portions of the skeleton.  IMPRESSION: 1. Acute diverticulitis of the sigmoid colon. No discrete diverticular abscess or signs of frank perforation are noted at this time. 2. Normal appendix. 3. 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 4. Two simple cysts in the left kidney are incidentally noted. 5. Aortic atherosclerosis. Electronically Signed   By: Trudie Reedaniel  Entrikin M.D.   On: 09/08/2016 10:58   Dg Hip Unilat With Pelvis 2-3 Views Right  Result Date: 08/24/2016 CLINICAL DATA:  Onset acute right groin pain. Altered gait. Injury 3 days ago. EXAM: DG HIP (WITH OR WITHOUT PELVIS) 2-3V RIGHT COMPARISON:  None. FINDINGS: Mild degenerative changes in the right hip joint with joint space narrowing and spurring. No acute bony abnormality. Specifically, no fracture, subluxation, or dislocation. Soft tissues are intact. IMPRESSION: Mild degenerative changes.  No acute bony abnormality. Electronically Signed   By: Charlett NoseKevin  Dover M.D.   On: 08/24/2016 14:24    Christabell Loseke, DO  [facility] Pager [phone]  If 7PM-7AM, please contact night-coverage [email or web address] Password [email or web address] 09/13/2016, 6:14 PM   LOS: 5 days

## 2016-09-14 LAB — CBC
HCT: 35.6 % — ABNORMAL LOW (ref 39.0–52.0)
Hemoglobin: 11.8 g/dL — ABNORMAL LOW (ref 13.0–17.0)
MCH: 30.2 pg (ref 26.0–34.0)
MCHC: 33.1 g/dL (ref 30.0–36.0)
MCV: 91 fL (ref 78.0–100.0)
Platelets: 321 10*3/uL (ref 150–400)
RBC: 3.91 MIL/uL — ABNORMAL LOW (ref 4.22–5.81)
RDW: 12 % (ref 11.5–15.5)
WBC: 8.6 10*3/uL (ref 4.0–10.5)

## 2016-09-14 MED ORDER — AMOXICILLIN-POT CLAVULANATE 875-125 MG PO TABS: 1.0000 | ORAL_TABLET | Freq: Two times a day (BID) | ORAL | 0 refills | Status: AC

## 2016-09-14 MED ORDER — HYDROCODONE-ACETAMINOPHEN 10-325 MG PO TABS: 1.0000 | ORAL_TABLET | Freq: Four times a day (QID) | ORAL | 0 refills | Status: DC | PRN

## 2016-09-14 NOTE — Discharge Instructions (Signed)

## 2016-09-14 NOTE — Discharge Summary (Signed)
Physician Discharge Summary  Eric Savage UJW:[id number] DOB: 1965/12/20 DOA: 09/08/2016  PCP: Egbert GaribaldiMillsaps, KIMBERLY M, NP  Admit date: 09/08/2016 Discharge date: 09/14/2016  Admitted From: Home Disposition:  Home  Recommendations for Outpatient Follow-up:  1. Follow up with PCP in 1-2 weeks 2. Follow up with general surgery in one month  Home Health:No Equipment/Devices: None  Discharge Condition:stable CODE STATUS:FULL Diet recommendation: Heart Healthy   Brief/Interim Summary: 50 year old male with a history of hypertension, CAD, diverticulitis, tobacco abuse presents with increasing left lower quadrant pain. Patient has been followed by hisgastroenterologist, Dr. Lita MainsMann,and has been seen almost weekly for the past 3-4 weeks for diverticulitis. The patient has been taking Cipro Floxin and metronidazole as well as hydrocodone for pain relief. The patient woke up with increasing left lower quadrant pain. He decided to take some hydrocodone and went back to sleep. He woke up on the morning of 09/08/2016 with worsening pain. As result, he presented to the hospital for further evaluation. CT of the abdomen and pelvis revealed wall thickening of the proximal sigmoid colon with pericolonic inflammatory changes suggestive of acute diverticulitis. The patient was started on intravenous fluids and intravenous antibiotics. GI and general surgerywereconsulted. The patient did show clinical improvement. However, on the evening of 09/11/16, he had more abdominal pain and WBC count increased on 09/12/2016 with persistent abdominal pain. 09/12/16 repeat CT of abdomen and pelvis was performed and showed slight increase of wall thickening in the sigmoid colon. There was no abscess or perforation. General surgery followed the patient and started the patient on ertapenem.  He subsequently showed clinical improvement.  His diet was advanced.  He was discharged with 10 additional days of  amox/clav.  Discharge Diagnoses:  Acute diverticulitis -Advance to full liquids 12/7 -pt ready to go home;  He did not want to wait to try soft/heart healthy diet before leaving -startertapenem 12/6 per general surgery recommendation-->pain improving and WBC improving -continue IVF--saline lock -09/12/16--CT abdomen pelvis--significant wall thickening proximal and mid sigmoid colon with slight increase in pericolonicinflammatory changes -home with amox/clav x 10 more days  Coronary artery disease -No anginal symptoms -Continue aspirin -RCA/CFX DES 12/10 with residual 50-60% LAD.- Cath 05/19/12- and 01/28/13 no ISR- med Rx  Essential hypertension -Continue lisinopril -BP elevated in part due to pain -follow up with PCP  Tobacco abuse -cessation discussed  HLD -continue statin  Impaired glucose tolerance -09/10/16 A1C 5.6 -follow up with PCP   Discharge Instructions  Discharge Instructions    Diet - low sodium heart healthy    Complete by:  As directed    Increase activity slowly    Complete by:  As directed        Medication List    STOP taking these medications   ciprofloxacin 500 MG tablet Commonly known as:  CIPRO   metroNIDAZOLE 250 MG tablet Commonly known as:  FLAGYL   nitroGLYCERIN 0.4 MG SL tablet Commonly known as:  NITROSTAT     TAKE these medications   amoxicillin-clavulanate 875-125 MG tablet Commonly known as:  AUGMENTIN Take 1 tablet by mouth every 12 (twelve) hours.   aspirin 81 MG tablet Take 81 mg by mouth daily.   HYDROcodone-acetaminophen 10-325 MG tablet Commonly known as:  NORCO Take 1-2 tablets by mouth every 6 (six) hours as needed for severe pain.   lisinopril 20 MG tablet Commonly known as:  PRINIVIL,ZESTRIL TAKE 1 TABLET (20 MG TOTAL) BY MOUTH DAILY.   simvastatin 20 MG tablet Commonly known as:  ZOCOR  TAKE 1 TABLET (20 MG TOTAL) BY MOUTH EVERY EVENING.      Follow-up Information    THOMPSON,BURKE E, MD.  Schedule an appointment as soon as possible for a visit in 4 week(s).   Specialty:  General Surgery Contact information: [address] [place] [address] [phone]          No Known Allergies  Consultations:  Dr. Elnoria Howard, GI  General Surgery   Procedures/Studies: X-ray Chest Pa And Lateral  Result Date: 09/09/2016 CLINICAL DATA:  Lower abdominal pain. EXAM: CHEST  2 VIEW COMPARISON:  January 27, 2013 FINDINGS: The heart size and mediastinal contours are within normal limits. Both lungs are clear. The visualized skeletal structures are unremarkable. IMPRESSION: No active cardiopulmonary disease. Electronically Signed   By: Gerome Sam III M.D   On: 09/09/2016 09:11   Ct Abdomen Pelvis W Contrast  Result Date: 09/12/2016 CLINICAL DATA:  History of diverticulitis : CT ABDOMEN AND PELVIS WITH CONTRAST TECHNIQUE: Multidetector CT imaging of the abdomen and pelvis was performed using the standard protocol following bolus administration of intravenous contrast. CONTRAST:  ISOVUE-300 IOPAMIDOL (ISOVUE-300) INJECTION 61% COMPARISON:  09/08/2016 FINDINGS: Lower chest: No acute abnormality. Hepatobiliary: Hemangiomas are again identified within the left lobe of the liver both medially and laterally. No other focal abnormality is seen. The gallbladder is within normal limits. Pancreas: Unremarkable. No pancreatic ductal dilatation or surrounding inflammatory changes. Spleen: Normal in size without focal abnormality. Adrenals/Urinary Tract: The adrenal glands are within normal limits. Left renal cyst is noted and stable. No obstructive changes are seen. Stomach/Bowel: Significant wall thickening and pericolonic inflammatory changes are again noted in the proximal and mid sigmoid colon. Multiple large dilated diverticular are identified with surrounding inflammatory change. No findings to suggest perforation or focal abscess formation are noted at this time. The appendix is within  normal limits. Vascular/Lymphatic: Aortic atherosclerosis. No enlarged abdominal or pelvic lymph nodes. Reproductive: Prostate is unremarkable. Other: No abdominal wall hernia or abnormality. No abdominopelvic ascites. Musculoskeletal: No acute or significant osseous findings. IMPRESSION: Significant sigmoid diverticulitis with slight increase in the degree of pericolonic inflammatory change. Again no evidence of perforation or abscess formation is noted. Hemangiomas in the liver similar to that noted on the prior exam. Electronically Signed   By: Alcide Clever M.D.   On: 09/12/2016 12:22   Ct Abdomen Pelvis W Contrast  Result Date: 09/08/2016 CLINICAL DATA:  50 year old male with history of lower abdominal pain and leukocytosis on antibiotics. Possible recurrent diverticulitis. Some emesis this morning. EXAM: CT ABDOMEN AND PELVIS WITH CONTRAST TECHNIQUE: Multidetector CT imaging of the abdomen and pelvis was performed using the standard protocol following bolus administration of intravenous contrast. CONTRAST:  ISOVUE-300 IOPAMIDOL (ISOVUE-300) INJECTION 61% COMPARISON:  CT the abdomen and pelvis 08/06/2016. FINDINGS: Lower chest: Atherosclerotic calcifications in the right coronary artery. Hepatobiliary: Hypervascular lesions are again noted in segment 4A and 3, measuring up to 2.2 x 2.7 cm in segment 4A (image 11 of series 2) very similar to remote prior examinations dating back to 03/12/2004, most compatible with benign cavernous hemangiomas. No suspicious hepatic lesions are otherwise identified. No intra or extrahepatic biliary ductal dilatation. Gallbladder is normal in appearance. Pancreas: No pancreatic mass. No pancreatic ductal dilatation. No pancreatic or peripancreatic fluid or inflammatory changes. Spleen: Unremarkable. Adrenals/Urinary Tract: 2 low-attenuation lesions in the left kidney measuring up to 2.2 cm in the upper pole are compatible with simple cysts. Right kidney and bilateral  adrenal glands are  normal in appearance. There is no hydroureteronephrosis or perinephric stranding to indicate urinary tract obstruction at this time. Urinary bladder is normal in appearance. Stomach/Bowel: Normal appearance of the stomach. There is no pathologic dilatation of small bowel or colon. Numerous colonic diverticulae are noted. Importantly, in the sigmoid colon there is also extensive mural thickening and extensive surrounding inflammatory changes associated with the sigmoid mesocolon, compatible with severe acute diverticulitis. No discrete diverticular abscess is identified. No signs of frank perforation of the bowel. Normal appendix. Vascular/Lymphatic: Aortic atherosclerosis, without evidence of aneurysm or dissection in the abdominal and pelvic vasculature. No lymphadenopathy noted in the abdomen or pelvis. Reproductive: Prostate gland and seminal vesicles are unremarkable in appearance. Other: Trace volume of ascites noted in the low anatomic pelvis, presumably reactive. No larger volume of ascites. No pneumoperitoneum. Musculoskeletal: There are no aggressive appearing lytic or blastic lesions noted in the visualized portions of the skeleton. IMPRESSION: 1. Acute diverticulitis of the sigmoid colon. No discrete diverticular abscess or signs of frank perforation are noted at this time. 2. Normal appendix. 3. 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 4. Two simple cysts in the left kidney are incidentally noted. 5. Aortic atherosclerosis. Electronically Signed   By: Trudie Reed M.D.   On: 09/08/2016 10:58   Dg Hip Unilat With Pelvis 2-3 Views Right  Result Date: 08/24/2016 CLINICAL DATA:  Onset acute right groin pain. Altered gait. Injury 3 days ago. EXAM: DG HIP (WITH OR WITHOUT PELVIS) 2-3V RIGHT COMPARISON:  None. FINDINGS: Mild degenerative changes in the right hip joint with joint space narrowing and spurring. No acute bony abnormality. Specifically, no  fracture, subluxation, or dislocation. Soft tissues are intact. IMPRESSION: Mild degenerative changes.  No acute bony abnormality. Electronically Signed   By: Charlett Nose M.D.   On: 08/24/2016 14:24        Discharge Exam: Vitals:   09/14/16 0012 09/14/16 0546  BP: 124/74 (!) 151/78  Pulse: (!) 56 64  Resp:  17  Temp:  98.1 F (36.7 C)   Vitals:   09/13/16 1417 09/13/16 2105 09/14/16 0012 09/14/16 0546  BP: (!) 146/90 (!) 165/90 124/74 (!) 151/78  Pulse: 71 76 (!) 56 64  Resp: 16 17  17   Temp: 98.7 F (37.1 C) 98.4 F (36.9 C)  98.1 F (36.7 C)  TempSrc: Oral Oral  Oral  SpO2: 99% 98%  100%  Weight:      Height:        General: Pt is alert, awake, not in acute distress Cardiovascular: RRR, S1/S2 +, no rubs, no gallops Respiratory: diminished BS without wheezing Abdominal: Soft, NT, ND, bowel sounds + Extremities: no edema, no cyanosis   The results of significant diagnostics from this hospitalization (including imaging, microbiology, ancillary and laboratory) are listed below for reference.    Significant Diagnostic Studies: X-ray Chest Pa And Lateral  Result Date: 09/09/2016 CLINICAL DATA:  Lower abdominal pain. EXAM: CHEST  2 VIEW COMPARISON:  January 27, 2013 FINDINGS: The heart size and mediastinal contours are within normal limits. Both lungs are clear. The visualized skeletal structures are unremarkable. IMPRESSION: No active cardiopulmonary disease. Electronically Signed   By: Gerome Sam III M.D   On: 09/09/2016 09:11   Ct Abdomen Pelvis W Contrast  Result Date: 09/12/2016 CLINICAL DATA:  History of diverticulitis : CT ABDOMEN AND PELVIS WITH CONTRAST TECHNIQUE: Multidetector CT imaging of the abdomen and pelvis was performed using the standard protocol following bolus administration of intravenous  contrast. CONTRAST:  ISOVUE-300 IOPAMIDOL (ISOVUE-300) INJECTION 61% COMPARISON:  09/08/2016 FINDINGS: Lower chest: No acute abnormality. Hepatobiliary:  Hemangiomas are again identified within the left lobe of the liver both medially and laterally. No other focal abnormality is seen. The gallbladder is within normal limits. Pancreas: Unremarkable. No pancreatic ductal dilatation or surrounding inflammatory changes. Spleen: Normal in size without focal abnormality. Adrenals/Urinary Tract: The adrenal glands are within normal limits. Left renal cyst is noted and stable. No obstructive changes are seen. Stomach/Bowel: Significant wall thickening and pericolonic inflammatory changes are again noted in the proximal and mid sigmoid colon. Multiple large dilated diverticular are identified with surrounding inflammatory change. No findings to suggest perforation or focal abscess formation are noted at this time. The appendix is within normal limits. Vascular/Lymphatic: Aortic atherosclerosis. No enlarged abdominal or pelvic lymph nodes. Reproductive: Prostate is unremarkable. Other: No abdominal wall hernia or abnormality. No abdominopelvic ascites. Musculoskeletal: No acute or significant osseous findings. IMPRESSION: Significant sigmoid diverticulitis with slight increase in the degree of pericolonic inflammatory change. Again no evidence of perforation or abscess formation is noted. Hemangiomas in the liver similar to that noted on the prior exam. Electronically Signed   By: Alcide Clever M.D.   On: 09/12/2016 12:22   Ct Abdomen Pelvis W Contrast  Result Date: 09/08/2016 CLINICAL DATA:  50 year old male with history of lower abdominal pain and leukocytosis on antibiotics. Possible recurrent diverticulitis. Some emesis this morning. EXAM: CT ABDOMEN AND PELVIS WITH CONTRAST TECHNIQUE: Multidetector CT imaging of the abdomen and pelvis was performed using the standard protocol following bolus administration of intravenous contrast. CONTRAST:  ISOVUE-300 IOPAMIDOL (ISOVUE-300) INJECTION 61% COMPARISON:  CT the abdomen and pelvis 08/06/2016. FINDINGS: Lower chest:  Atherosclerotic calcifications in the right coronary artery. Hepatobiliary: Hypervascular lesions are again noted in segment 4A and 3, measuring up to 2.2 x 2.7 cm in segment 4A (image 11 of series 2) very similar to remote prior examinations dating back to 03/12/2004, most compatible with benign cavernous hemangiomas. No suspicious hepatic lesions are otherwise identified. No intra or extrahepatic biliary ductal dilatation. Gallbladder is normal in appearance. Pancreas: No pancreatic mass. No pancreatic ductal dilatation. No pancreatic or peripancreatic fluid or inflammatory changes. Spleen: Unremarkable. Adrenals/Urinary Tract: 2 low-attenuation lesions in the left kidney measuring up to 2.2 cm in the upper pole are compatible with simple cysts. Right kidney and bilateral adrenal glands are normal in appearance. There is no hydroureteronephrosis or perinephric stranding to indicate urinary tract obstruction at this time. Urinary bladder is normal in appearance. Stomach/Bowel: Normal appearance of the stomach. There is no pathologic dilatation of small bowel or colon. Numerous colonic diverticulae are noted. Importantly, in the sigmoid colon there is also extensive mural thickening and extensive surrounding inflammatory changes associated with the sigmoid mesocolon, compatible with severe acute diverticulitis. No discrete diverticular abscess is identified. No signs of frank perforation of the bowel. Normal appendix. Vascular/Lymphatic: Aortic atherosclerosis, without evidence of aneurysm or dissection in the abdominal and pelvic vasculature. No lymphadenopathy noted in the abdomen or pelvis. Reproductive: Prostate gland and seminal vesicles are unremarkable in appearance. Other: Trace volume of ascites noted in the low anatomic pelvis, presumably reactive. No larger volume of ascites. No pneumoperitoneum. Musculoskeletal: There are no aggressive appearing lytic or blastic lesions noted in the visualized portions  of the skeleton. IMPRESSION: 1. Acute diverticulitis of the sigmoid colon. No discrete diverticular abscess or signs of frank perforation are noted at this time. 2. Normal appendix. 3. 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 4. Two simple cysts in the left kidney are incidentally noted. 5. Aortic atherosclerosis. Electronically Signed   By: Trudie Reedaniel  Entrikin M.D.   On: 09/08/2016 10:58   Dg Hip Unilat With Pelvis 2-3 Views Right  Result Date: 08/24/2016 CLINICAL DATA:  Onset acute right groin pain. Altered gait. Injury 3 days ago. EXAM: DG HIP (WITH OR WITHOUT PELVIS) 2-3V RIGHT COMPARISON:  None. FINDINGS: Mild degenerative changes in the right hip joint with joint space narrowing and spurring. No acute bony abnormality. Specifically, no fracture, subluxation, or dislocation. Soft tissues are intact. IMPRESSION: Mild degenerative changes.  No acute bony abnormality. Electronically Signed   By: Charlett NoseKevin  Dover M.D.   On: 08/24/2016 14:24     Microbiology: Recent Results (from the past 240 hour(s))  Urine culture     Status: Abnormal   Collection Time: 09/08/16  7:25 AM  Result Value Ref Range Status   Specimen Description URINE, CLEAN CATCH  Final   Special Requests NONE  Final   Culture <10,000 COLONIES/mL INSIGNIFICANT GROWTH (A)  Final   Report Status 09/09/2016 FINAL  Final  Culture, blood (Routine X 2) w Reflex to ID Panel     Status: None   Collection Time: 09/08/16  1:40 PM  Result Value Ref Range Status   Specimen Description BLOOD RIGHT ANTECUBITAL  Final   Special Requests BOTTLES DRAWN AEROBIC AND ANAEROBIC 10CC  Final   Culture NO GROWTH 5 DAYS  Final   Report Status 09/13/2016 FINAL  Final  Culture, blood (Routine X 2) w Reflex to ID Panel     Status: None   Collection Time: 09/08/16  1:46 PM  Result Value Ref Range Status   Specimen Description BLOOD RIGHT HAND  Final   Special Requests BOTTLES DRAWN AEROBIC AND ANAEROBIC 5CC  Final   Culture NO  GROWTH 5 DAYS  Final   Report Status 09/13/2016 FINAL  Final     Labs: Basic Metabolic Panel:  Recent Labs Lab 09/08/16 0710 09/09/16 0451 09/10/16 0619 09/12/16 0505 09/13/16 0427  NA 133* 136 138 140 141  K 4.6 4.0 4.2 3.8 3.8  CL 99* 102 101 103 104  CO2 25 28 27 28 28   GLUCOSE 126* 115* 121* 107* 98  BUN 15 12 9  5* 5*  CREATININE 1.06 1.11 0.99 0.93 0.95  CALCIUM 9.2 8.8* 8.9 8.9 9.1  MG  --   --   --   --  2.0   Liver Function Tests:  Recent Labs Lab 09/08/16 0710 09/09/16 0451  AST 18 13*  ALT 24 18  ALKPHOS 81 61  BILITOT 0.6 0.7  PROT 6.1* 5.7*  ALBUMIN 3.8 3.4*    Recent Labs Lab 09/08/16 0710  LIPASE 15   No results for input(s): AMMONIA in the last 168 hours. CBC:  Recent Labs Lab 09/10/16 0619 09/11/16 0514 09/12/16 0505 09/13/16 0427 09/14/16 0521  WBC 11.8* 11.4* 12.6* 9.9 8.6  HGB 13.0 12.2* 11.6* 12.1* 11.8*  HCT 39.5 37.7* 36.0* 37.7* 35.6*  MCV 91.0 91.3 91.6 92.4 91.0  PLT 234 262 268 283 321   Cardiac Enzymes: No results for input(s): CKTOTAL, CKMB, CKMBINDEX, TROPONINI in the last 168 hours. BNP: Invalid input(s): POCBNP CBG: No results for input(s): GLUCAP in the last 168 hours.  Time coordinating discharge:  Greater than 30 minutes  Signed:  Tekeya Geffert, DO [facility] Pager: [phone] 09/14/2016, 10:20 AM

## 2016-09-14 NOTE — Progress Notes (Signed)
[facility] Progress Note     Subjective: Pt states he feels great and is ready to go home. He is having BM's, his pain has improved and he denies nausea or vomiting.   Objective: Vital signs in last 24 hours: Temp:  [98.1 F (36.7 C)-98.7 F (37.1 C)] 98.1 F (36.7 C) (12/08 0546) Pulse Rate:  [56-76] 64 (12/08 0546) Resp:  [16-17] 17 (12/08 0546) BP: (124-165)/(74-90) 151/78 (12/08 0546) SpO2:  [98 %-100 %] 100 % (12/08 0546) Last BM Date: 09/13/16  Intake/Output from previous day: 12/07 0701 - 12/08 0700 In: 1010 [P.O.:960; IV Piggyback:50] Out: -  Intake/Output this shift: No intake/output data recorded.  PE: Gen:  Alert, NAD, pleasant, cooperative Card:  RRR, no M/G/R heard Abd: Soft, ND, +BS, very mild TTP to LLQ and suprapubic region Skin: no rashes noted, warm and dry   Lab Results:   Recent Labs  09/13/16 0427 09/14/16 0521  WBC 9.9 8.6  HGB 12.1* 11.8*  HCT 37.7* 35.6*  PLT 283 321   BMET  Recent Labs  09/12/16 0505 09/13/16 0427  NA 140 141  K 3.8 3.8  CL 103 104  CO2 28 28  GLUCOSE 107* 98  BUN 5* 5*  CREATININE 0.93 0.95  CALCIUM 8.9 9.1   PT/INR No results for input(s): LABPROT, INR in the last 72 hours. CMP     Component Value Date/Time   NA 141 09/13/2016 0427   K 3.8 09/13/2016 0427   CL 104 09/13/2016 0427   CO2 28 09/13/2016 0427   GLUCOSE 98 09/13/2016 0427   BUN 5 (L) 09/13/2016 0427   CREATININE 0.95 09/13/2016 0427   CALCIUM 9.1 09/13/2016 0427   PROT 5.7 (L) 09/09/2016 0451   ALBUMIN 3.4 (L) 09/09/2016 0451   AST 13 (L) 09/09/2016 0451   ALT 18 09/09/2016 0451   ALKPHOS 61 09/09/2016 0451   BILITOT 0.7 09/09/2016 0451   GFRNONAA >60 09/13/2016 0427   GFRAA >60 09/13/2016 0427   Lipase     Component Value Date/Time   LIPASE 15 09/08/2016 0710       Studies/Results: Ct Abdomen Pelvis W Contrast  Result Date: 09/12/2016 CLINICAL DATA:  History of diverticulitis : CT ABDOMEN AND PELVIS WITH  CONTRAST TECHNIQUE: Multidetector CT imaging of the abdomen and pelvis was performed using the standard protocol following bolus administration of intravenous contrast. CONTRAST:  100mL ISOVUE-300 IOPAMIDOL (ISOVUE-300) INJECTION 61% COMPARISON:  09/08/2016 FINDINGS: Lower chest: No acute abnormality. Hepatobiliary: Hemangiomas are again identified within the left lobe of the liver both medially and laterally. No other focal abnormality is seen. The gallbladder is within normal limits. Pancreas: Unremarkable. No pancreatic ductal dilatation or surrounding inflammatory changes. Spleen: Normal in size without focal abnormality. Adrenals/Urinary Tract: The adrenal glands are within normal limits. Left renal cyst is noted and stable. No obstructive changes are seen. Stomach/Bowel: Significant wall thickening and pericolonic inflammatory changes are again noted in the proximal and mid sigmoid colon. Multiple large dilated diverticular are identified with surrounding inflammatory change. No findings to suggest perforation or focal abscess formation are noted at this time. The appendix is within normal limits. Vascular/Lymphatic: Aortic atherosclerosis. No enlarged abdominal or pelvic lymph nodes. Reproductive: Prostate is unremarkable. Other: No abdominal wall hernia or abnormality. No abdominopelvic ascites. Musculoskeletal: No acute or significant osseous findings. IMPRESSION: Significant sigmoid diverticulitis with slight increase in the degree of pericolonic inflammatory change. Again no evidence of perforation or abscess formation is noted. Hemangiomas in the liver similar  to that noted on the prior exam. Electronically Signed   By: Alcide CleverMark  Lukens M.D.   On: 09/12/2016 12:22    Anti-infectives: Anti-infectives    Start     Dose/Rate Route Frequency Ordered Stop   09/12/16 1700  ertapenem (INVANZ) 1 g in sodium chloride 0.9 % 50 mL IVPB     1 g 100 mL/hr over 30 Minutes Intravenous Every 24 hours 09/12/16 1415      09/08/16 1800  piperacillin-tazobactam (ZOSYN) IVPB 3.375 g  Status:  Discontinued     3.375 g 12.5 mL/hr over 240 Minutes Intravenous Every 8 hours 09/08/16 1500 09/12/16 1415   09/08/16 1345  piperacillin-tazobactam (ZOSYN) IVPB 3.375 g     3.375 g 100 mL/hr over 30 Minutes Intravenous  Once 09/08/16 1331 09/08/16 1419   09/08/16 1215  piperacillin-tazobactam (ZOSYN) IVPB 3.375 g     3.375 g 100 mL/hr over 30 Minutes Intravenous  Once 09/08/16 1200 09/08/16 1331       Assessment/Plan  Acute sigmoid diverticulitis - 15 year history of 1-2x/year flare-ups of diverticulitis treated conservatively - since September he has been on/off cipro/flagyl without resolution of his symptoms - CT 09/08/16 showed acute diverticulitis of the sigmoid colon, no discrete diverticular abscess or signs of frank perforation are noted at this time - hope to resolve this medically and plan elective colectomy in several weeks  CAD s/p stenting 2010 - followed by Dr. Herbie BaltimoreHarding. Echo 05/13/2009 EF >55% HTN HLD GERD Tobacco abuse  ID - zosyn 12/2>>12/06, Invanz 12/06>> VTE - lovenox, SCDs FEN - advanced to fulls  Plan - WBC normal today, no fevers overnight. Pt is okay for d/c home today from a surgical standpoint. Will be d/c'd on PO antibiotics and f/u with Dr. Janee Mornhompson in 4-5 weeks.    LOS: 6 days    Jerre SimonJessica L Damico Partin , [facility] 09/14/2016, 7:43 AM Pager: [phone] Consults: [phone] Mon-Fri 7:00 am-4:30 pm Sat-Sun 7:00 am-11:30 am

## 2016-09-14 NOTE — Progress Notes (Signed)
Eric Savage to be D/C'd to home per MD order.  Discussed with the patient and all questions fully answered.  VSS, Skin clean, dry and intact without evidence of skin break down, no evidence of skin tears noted. IV catheter discontinued intact. Site without signs and symptoms of complications. Dressing and pressure applied.  An After Visit Summary was printed and given to the patient. Patient received prescriptions.  D/c education completed with patient/family including follow up instructions, medication list, d/c activities limitations if indicated, with other d/c instructions as indicated by MD - patient able to verbalize understanding, all questions fully answered.   Patient instructed to return to ED, call 911, or call MD for any changes in condition.   Patient escorted via WC, and D/C home via private auto.  [name] 09/14/2016 11:15 AM

## 2016-10-09 ENCOUNTER — Other Ambulatory Visit: Payer: Self-pay | Admitting: *Deleted

## 2016-10-09 ENCOUNTER — Ambulatory Visit
Admission: RE | Admit: 2016-10-09 | Discharge: 2016-10-09 | Disposition: A | Discharge status: Home / Self Care | Payer: Commercial Managed Care - HMO | Source: Ambulatory Visit | Attending: *Deleted | Admitting: *Deleted

## 2016-10-09 DIAGNOSIS — K5732 Diverticulitis of large intestine without perforation or abscess without bleeding: Secondary | ICD-10-CM

## 2016-10-09 MED ORDER — IOPAMIDOL (ISOVUE-300) INJECTION 61%
100.0000 mL | Freq: Once | INTRAVENOUS | Status: AC | PRN
  Administered 2016-10-09: 100 mL via INTRAVENOUS

## 2016-11-14 ENCOUNTER — Other Ambulatory Visit: Payer: Self-pay | Admitting: Cardiology

## 2016-11-14 ENCOUNTER — Ambulatory Visit: Payer: Self-pay | Admitting: General Surgery

## 2016-11-16 ENCOUNTER — Telehealth: Payer: Self-pay | Admitting: *Deleted

## 2016-11-16 NOTE — Telephone Encounter (Signed)
[facility]   Request for surgical clearance:  1. What type of surgery is being performed? LAPAROSCOPIC- ASSISTED SIGMOID COLECTOMY  2. When is this surgery scheduled? MARCH TBA  3. Are there any medications that need to be held prior to surgery and how long?  aspirin  4. Name of physician performing surgery? DR Laurell JosephsBURKE THOMPSON  5. What is your office phone and fax number? Salome ArntTN ARMEN CMA-- [phone]  PHONE [phone]

## 2016-11-19 NOTE — Telephone Encounter (Signed)
Okay to stop aspirin  Bryan Lemmaavid [name], MD

## 2016-11-21 NOTE — Telephone Encounter (Signed)
[facility] Dr Carollee Massedhompson's office Attn Armen

## 2016-11-26 ENCOUNTER — Ambulatory Visit: Payer: Self-pay | Admitting: General Surgery

## 2016-11-30 ENCOUNTER — Telehealth: Payer: Self-pay | Admitting: Cardiology

## 2016-11-30 MED ORDER — LISINOPRIL 20 MG PO TABS
20.0000 mg | ORAL_TABLET | Freq: Every day | ORAL | 0 refills | Status: DC
Start: 2016-11-30 — End: 2016-12-06

## 2016-11-30 MED ORDER — SIMVASTATIN 20 MG PO TABS
20.0000 mg | ORAL_TABLET | Freq: Every day | ORAL | 0 refills | Status: DC
Start: 2016-11-30 — End: 2016-12-06

## 2016-11-30 NOTE — Telephone Encounter (Signed)
Patient is calling because the he was unable to get his Lisinopril, 20mg  and simvastatin 20 refilled. The pharmacy informed him that the refill was denied by Dr. Herbie BaltimoreHarding. Patient would like to know the next steps. Thanks.

## 2016-11-30 NOTE — Telephone Encounter (Signed)
Rx(s) sent to pharmacy electronically. Patient notified no refills were provided as he was due for appt Scheduled to see MD 12/06/16

## 2016-12-06 ENCOUNTER — Encounter: Payer: Self-pay | Admitting: Cardiology

## 2016-12-06 ENCOUNTER — Ambulatory Visit (INDEPENDENT_AMBULATORY_CARE_PROVIDER_SITE_OTHER): Payer: Commercial Managed Care - HMO | Admitting: Cardiology

## 2016-12-06 VITALS — BP 102/68 | HR 69 | Ht 64.0 in | Wt 180.0 lb

## 2016-12-06 DIAGNOSIS — Z01810 Encounter for preprocedural cardiovascular examination: Secondary | ICD-10-CM | POA: Diagnosis not present

## 2016-12-06 DIAGNOSIS — E785 Hyperlipidemia, unspecified: Secondary | ICD-10-CM

## 2016-12-06 DIAGNOSIS — I10 Essential (primary) hypertension: Secondary | ICD-10-CM

## 2016-12-06 DIAGNOSIS — I2510 Atherosclerotic heart disease of native coronary artery without angina pectoris: Secondary | ICD-10-CM

## 2016-12-06 DIAGNOSIS — Z716 Tobacco abuse counseling: Secondary | ICD-10-CM

## 2016-12-06 DIAGNOSIS — Z9861 Coronary angioplasty status: Secondary | ICD-10-CM | POA: Diagnosis not present

## 2016-12-06 MED ORDER — SIMVASTATIN 20 MG PO TABS
20.0000 mg | ORAL_TABLET | Freq: Every day | ORAL | 3 refills | Status: DC
Start: 2016-12-06 — End: 2016-12-21

## 2016-12-06 MED ORDER — LISINOPRIL 20 MG PO TABS: 20.0000 mg | ORAL_TABLET | Freq: Every day | ORAL | 3 refills | Status: DC

## 2016-12-06 NOTE — Assessment & Plan Note (Signed)
Eric Savage is doing well with no active cardiac symptoms. He's never had any cerebrovascular disease, he does not have diabetes. No cerebrovascular disease, normal renal function.  PREOPERATIVE CARDIAC RISK ASSESSMENT   Revised Cardiac Risk Index:  High Risk Surgery: yes; intermediate-high because of intraperitoneal surgery, although less risk when laparoscopic  Defined as Intraperitoneal, intrathoracic or suprainguinal vascular  Active CAD: no;   CHF: no;   Cerebrovascular Disease: no;   Diabetes: no; On Insulin: No  CKD (Cr >~ 2): no;   Total: ~1 -because of the planned surgery. Estimated Risk of Adverse Outcome: LOW  Estimated Risk of MI, PE, VF/VT (Cardiac Arrest), Complete Heart Block: <1% %   ACC/AHA Guidelines for "Clearance":  Step 1 - Need for Emergency Surgery: No:   If Yes - go straight to OR with perioperative surveillance  Step 2 - Active Cardiac Conditions (Unstable Angina, Decompensated HF, Significant  Arrhytmias - Complete HB, Mobitz II, Symptomatic VT or SVT, Severe Aortic Stenosis - mean gradient > 40 mmHg, Valve area < 1.0 cm2):   No: No active symptoms  If Yes - Evaluate & Treat per ACC/AHA Guidelines  Step 3 -  Low Risk Surgery: No: Intermediate-High  If Yes --> proceed to OR  If No --> Step 4  Step 4 - Functional Capacity >= 4 METS without symptoms: Yes  If Yes --> proceed to OR  If No --> Step 5  Step 5 --  Clinical Risk Factors (CRF)   No CRFs: Yes  If Yes --> Proceed to OR  Based on this evaluation, recommendation would be to proceed with the OR without any further cardiac evaluation. He would be considered low risk patient for intermediate to high risk procedure.  We will be available if necessary perioperatively.

## 2016-12-06 NOTE — Assessment & Plan Note (Signed)
No recurrent anginal symptoms. He has had 2 heart catheterizations since his stents both showing widely patent stents. Moderate disease in the diagonal branch but has not had any anginal symptoms. He remains on aspirin and statin. Not on a beta blocker because of fatigue and bradycardia in the past. He is on an ACE inhibitor. No longer on Imdur or Thienopyridine Antiplatelet agent

## 2016-12-06 NOTE — Progress Notes (Signed)
PCP: [name], NP  Clinic Note: Chief Complaint  Patient presents with  . Pre-op Exam    colon surgery on 16th March, pt denied chest pain and SOB  . Coronary Artery Disease    HPI: Eric Savage is a 51 y.o. male with a PMH below who presents today for delayed annual follow-up for CAD-PCI.Marland Kitchen  Eric Savage was last seen in October 2016. He was doing very well without any major symptoms. He had some occasional twinging-type pain symptoms lasting about a minute or so but no significant cardiac symptoms.  Recent Hospitalizations: December 2017 admitted for diverticulitis.  Studies Reviewed: No new studies  Interval History: Eric Savage returns today for follow-up continued do well. He has no major complaints at all from a cardiac standpoint. He continues to be very active with his work, and is having to have this surgery done over with significant back to full work. He is not had any symptoms of resting or exertional angina or dyspnea.   No heart failure symptoms of PND, orthopnea or edema.  Very rare palpitations but otherwise no lightheadedness, dizziness, weakness or syncope/near syncope. No TIA/amaurosis fugax symptoms.  No claudication.  He continues to smoke, however he knows he needs to quit. He just does not a. He is hoping that he can take advantage of the in the hospital for his surgery as a time to make the break and quit.  ROS: A comprehensive was performed. Review of Systems  Constitutional: Negative for weight loss.  Respiratory: Negative for cough, sputum production and shortness of breath.   Gastrointestinal: Positive for abdominal pain and constipation.       He hasn't really had any recurrent bouts of diverticulitis since December, but he was doing pretty frequent episodes up until then. No more blood in the stool since December. Abdominal pain is resolved, but depending on what he eats and make it worse  Genitourinary: Negative for frequency and  hematuria.  Musculoskeletal: Negative.   Endo/Heme/Allergies: Does not bruise/bleed easily.  All other systems reviewed and are negative.  Past Medical History:  Diagnosis Date  . Acid reflux   . CAD S/P percutaneous coronary angioplasty 12/10   RCA/CFX DES  . Chest pain 01/2013   Evaluate cardiac catheterization, no obstructive CAD  . Diverticulitis   . High cholesterol   . Hypertension     Past Surgical History:  Procedure Laterality Date  . CORONARY ANGIOPLASTY WITH STENT PLACEMENT  09/2009   Proximal RCA - Cypher DES 3.0 mm but 33 mm postdilated to 3.7 mm; proximal CFX XIENCE V. DES 2.75 mm x 15 mm postdilated to 3.0 mm  . LEFT HEART CATHETERIZATION WITH CORONARY ANGIOGRAM N/A 05/19/2012   Procedure: LEFT HEART CATHETERIZATION WITH CORONARY ANGIOGRAM;  Surgeon: Marykay Lex, MD;  Location: [facility] CATH LAB;  Service: Cardiovascular;  Laterality: N/A;  . LEFT HEART CATHETERIZATION WITH CORONARY ANGIOGRAM N/A 01/28/2013   Procedure: LEFT HEART CATHETERIZATION WITH CORONARY ANGIOGRAM;  Surgeon: Lennette Bihari, MD;  Location: [facility] CATH LAB;  Service: Cardiovascular:  No evidence for restenosis of either RCA or LCX. Mild LAD 20% narrowing and 60 - 70% smooth mid diagonal 2 stenosis.  Marland Kitchen NM MYOCAR PERF WALL MOTION  01/04/2011   Protocol:Bruce, post stress EF=69%, Exercise Cap 13 METS, attenuation defect in inferior region of myocardium, no sig. ischemia demonstrated  . TONSILLECTOMY  ~ 1973  . TRANSTHORACIC ECHOCARDIOGRAM  05/13/2009   EF =>55%, mild mitral regurg, trace tricuspid regurg.  Current Meds  Medication Sig  . aspirin 81 MG tablet Take 81 mg by mouth daily.   Marland Kitchen HYDROcodone-acetaminophen (NORCO) 10-325 MG tablet Take 1-2 tablets by mouth every 6 (six) hours as needed for severe pain.  Marland Kitchen lisinopril (PRINIVIL,ZESTRIL) 20 MG tablet Take 1 tablet (20 mg total) by mouth daily.  . simvastatin (ZOCOR) 20 MG tablet Take 1 tablet (20 mg total) by mouth daily.  . [DISCONTINUED]  lisinopril (PRINIVIL,ZESTRIL) 20 MG tablet Take 1 tablet (20 mg total) by mouth daily.  . [DISCONTINUED] simvastatin (ZOCOR) 20 MG tablet Take 1 tablet (20 mg total) by mouth daily.    No Known Allergies  Social History   Social History  . Marital status: Single    Spouse name: N/A  . Number of children: N/A  . Years of education: N/A   Occupational History  . Unemployed    Social History Main Topics  . Smoking status: Current Every Day Smoker    Packs/day: 0.25    Years: 30.00    Types: Cigarettes  . Smokeless tobacco: Never Used  . Alcohol use 2.4 oz/week    4 Shots of liquor per week     Comment: 01/28/2013 "takes me a month to drink 1/5th"  . Drug use: No     Comment: 01/28/2013 last drug use ">10 yr ago"; had reported marijuana use several times a week  . Sexual activity: Not Currently   Other Topics Concern  . 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   Is active, but with a re-established spousal relationship, is eating a lot more "good - home cooked meals."    family history includes CAD (age of onset: 47) in his father.  Wt Readings from Last 3 Encounters:  12/06/16 81.6 kg (180 lb)  09/08/16 77.6 kg (171 lb)  07/25/15 87.1 kg (192 lb)    PHYSICAL EXAM BP 102/68   Pulse 69   Ht 5\' 4"  (1.626 m)   Wt 81.6 kg (180 lb)   BMI 30.90 kg/m  General appearance: alert, cooperative, appears stated age, no distress and Pleasant mood and affect. Well-nourished and well-groomed. Answers questions appropriately.  HEENT: [place]/AT, EOMI, MMM, anicteric sclera Neck: no carotid bruit, no JVD and supple, symmetrical, trachea midline  Lungs: CTAB, normal percussion bilaterally and Nonlabored. Good air movement. No W./R./R.  Heart: RRR, normal S1 & S2; No M/R/G; normal apical impulse  Abdomen: soft, non-tender; bowel sounds normal; no masses, no  organomegaly  Extremities: extremities normal, atraumatic, no cyanosis or edema,Pulses: 2+ and symmetric  Neurologic: Grossly normal     Adult ECG Report  Rate: 69 ;  Rhythm: normal sinus rhythm and Normal axis, intervals and durations;   Narrative Interpretation: Normal EKG   Other studies Reviewed: Additional studies/ records that were reviewed today include:  Recent Labs:  - He thinks his labs are followed by PCP Lab Results  Component Value Date   CHOL 178 01/28/2013   HDL 38 (L) 01/28/2013   LDLCALC 91 01/28/2013   TRIG 243 (H) 01/28/2013   CHOLHDL 4.7 01/28/2013    ASSESSMENT / PLAN: Problem List Items Addressed This Visit    CAD, RCA/CFX DES 12/10 with residual 50-60% LAD.- Cath 05/19/12- and 01/28/13 no ISR- med Rx - Primary (Chronic)    No recurrent anginal symptoms. He has had 2 heart catheterizations since  his stents both showing widely patent stents. Moderate disease in the diagonal branch but has not had any anginal symptoms. He remains on aspirin and statin. Not on a beta blocker because of fatigue and bradycardia in the past. He is on an ACE inhibitor. No longer on Imdur or Thienopyridine Antiplatelet agent      Relevant Medications   simvastatin (ZOCOR) 20 MG tablet   lisinopril (PRINIVIL,ZESTRIL) 20 MG tablet   Other Relevant Orders   EKG 12-Lead   Lipid panel   Dyslipidemia, goal LDL below 70 (Chronic)    Continues to be taking standing dose of simvastatin, however I'm not sure when the last time he had his labs checked.  Plan: We'll order fasting lipid panel and LFTs to be done as part of his preop evaluation lab draw.      Relevant Medications   simvastatin (ZOCOR) 20 MG tablet   lisinopril (PRINIVIL,ZESTRIL) 20 MG tablet   Other Relevant Orders   EKG 12-Lead   Lipid panel   Essential hypertension (Chronic)    Excellent control now on lisinopril.      Relevant Medications   simvastatin (ZOCOR) 20 MG tablet   lisinopril (PRINIVIL,ZESTRIL) 20 MG  tablet   Other Relevant Orders   EKG 12-Lead   Preoperative cardiovascular examination    Eric Savage is doing well with no active cardiac symptoms. He's never had any cerebrovascular disease, he does not have diabetes. No cerebrovascular disease, normal renal function.  PREOPERATIVE CARDIAC RISK ASSESSMENT   Revised Cardiac Risk Index:  High Risk Surgery: yes; intermediate-high because of intraperitoneal surgery, although less risk when laparoscopic  Defined as Intraperitoneal, intrathoracic or suprainguinal vascular  Active CAD: no;   CHF: no;   Cerebrovascular Disease: no;   Diabetes: no; On Insulin: No  CKD (Cr >~ 2): no;   Total: ~1 -because of the planned surgery. Estimated Risk of Adverse Outcome: LOW  Estimated Risk of MI, PE, VF/VT (Cardiac Arrest), Complete Heart Block: <1% %   ACC/AHA Guidelines for "Clearance":  Step 1 - Need for Emergency Surgery: No:   If Yes - go straight to OR with perioperative surveillance  Step 2 - Active Cardiac Conditions (Unstable Angina, Decompensated HF, Significant  Arrhytmias - Complete HB, Mobitz II, Symptomatic VT or SVT, Severe Aortic Stenosis - mean gradient > 40 mmHg, Valve area < 1.0 cm2):   No: No active symptoms  If Yes - Evaluate & Treat per ACC/AHA Guidelines  Step 3 -  Low Risk Surgery: No: Intermediate-High  If Yes --> proceed to OR  If No --> Step 4  Step 4 - Functional Capacity >= 4 METS without symptoms: Yes  If Yes --> proceed to OR  If No --> Step 5  Step 5 --  Clinical Risk Factors (CRF)   No CRFs: Yes  If Yes --> Proceed to OR  Based on this evaluation, recommendation would be to proceed with the OR without any further cardiac evaluation. He would be considered low risk patient for intermediate to high risk procedure.  We will be available if necessary perioperatively.      Tobacco abuse counseling (Chronic)    Smoking cessation instruction/counseling given:  counseled patient on the dangers of  tobacco use, advised patient to stop smoking, and reviewed strategies to maximize success      Relevant Orders   EKG 12-Lead      Current medicines are reviewed at length with the patient today. (+/- concerns) None The following changes have been made:  None  Patient Instructions  LABS - DO NOT EAT OR DRINK UNTIL LABS ARE DONE FOR THAT DAY   LIPID IF UNABLE TO HAVE DONE WITH PRE-OP ----- [facility]  [address] - FIRST FLOOR   CLEARANCE FOR SURGERY    Your physician wants you to follow-up in 12 MONTHS WITH  HAO MENG PAYou will receive a reminder letter in the mail two months in advance. If you don't receive a letter, please call our office to schedule the follow-up appointment.    Your physician wants you to follow-up in 24 MONTHS WITH DR Briaunna Grindstaff. You will receive a reminder letter in the mail two months in advance. If you don't receive a letter, please call our office to schedule the follow-up appointment.   If you need a refill on your cardiac medications before your next appointment, please call your pharmacy.    Studies Ordered:   Orders Placed This Encounter  Procedures  . Lipid panel  . EKG 12-Lead      Bryan Lemmaavid Irlene Crudup, M.D., M.S. Interventional Cardiologist   Pager # [phone] Phone # [phone] [address] [place], [place] [address]

## 2016-12-06 NOTE — Assessment & Plan Note (Signed)
Smoking cessation instruction/counseling given:  counseled patient on the dangers of tobacco use, advised patient to stop smoking, and reviewed strategies to maximize success 

## 2016-12-06 NOTE — Patient Instructions (Addendum)
LABS - DO NOT EAT OR DRINK UNTIL LABS ARE DONE FOR THAT DAY   LIPID IF UNABLE TO HAVE DONE WITH PRE-OP ----- [facility]  [address] - FIRST FLOOR   CLEARANCE FOR SURGERY    Your physician wants you to follow-up in 12 MONTHS WITH  HAO MENG PAYou will receive a reminder letter in the mail two months in advance. If you don't receive a letter, please call our office to schedule the follow-up appointment.    Your physician wants you to follow-up in 24 MONTHS WITH DR [name]. You will receive a reminder letter in the mail two months in advance. If you don't receive a letter, please call our office to schedule the follow-up appointment.   If you need a refill on your cardiac medications before your next appointment, please call your pharmacy.

## 2016-12-06 NOTE — Assessment & Plan Note (Signed)
Excellent control now on lisinopril.

## 2016-12-06 NOTE — Assessment & Plan Note (Signed)
Continues to be taking standing dose of simvastatin, however I'm not sure when the last time he had his labs checked.  Plan: We'll order fasting lipid panel and LFTs to be done as part of his preop evaluation lab draw.

## 2016-12-10 ENCOUNTER — Telehealth: Payer: Self-pay | Admitting: Cardiology

## 2016-12-10 NOTE — Telephone Encounter (Signed)
Letter created on 3/1 by Dr. Herbie BaltimoreHarding was faxed to requestor.

## 2016-12-10 NOTE — Telephone Encounter (Signed)
New message    [facility] calling to see if clearance has been faxed to their office. Pt was seen on 3/1. Fax- [phone]

## 2016-12-11 ENCOUNTER — Ambulatory Visit: Payer: Commercial Managed Care - HMO | Admitting: Cardiology

## 2016-12-14 ENCOUNTER — Ambulatory Visit: Payer: Commercial Managed Care - HMO | Admitting: Cardiology

## 2016-12-17 NOTE — Pre-Procedure Instructions (Signed)
Santiago BurDonald J Jaroszewski  12/17/2016      [id number] [facility], [place] [address] [facility] [place] [id number] Phone: [phone] Fax: [phone]    Your procedure is scheduled on March 16  [facility]  Call this number if you have problems the morning of surgery:  [phone]   Remember:  Do not eat food or drink liquids after midnight.   Take these medicines the morning of surgery with A SIP OF WATER NONE  7 days prior to surgery STOP taking any Aspirin, Aleve, Naproxen, Ibuprofen, Motrin, Advil, Goody's, BC's, all herbal medications, fish oil, and all vitamins    Do not wear jewelry  Do not wear lotions, powders, or cologne, or deoderant.  Men may shave face and neck.  Do not bring valuables to the hospital.  [facility] is not responsible for any belongings or valuables.  Contacts, dentures or bridgework may not be worn into surgery.  Leave your suitcase in the car.  After surgery it may be brought to your room.  For patients admitted to the hospital, discharge time will be determined by your treatment team.  Patients discharged the day of surgery will not be allowed to drive home.    Special instructions:   [place]- Preparing For Surgery  Before surgery, you can play an important role. Because skin is not sterile, your skin needs to be as free of germs as possible. You can reduce the number of germs on your skin by washing with CHG (chlorahexidine gluconate) Soap before surgery.  CHG is an antiseptic cleaner which kills germs and bonds with the skin to continue killing germs even after washing.  Please do not use if you have an allergy to CHG or antibacterial soaps. If your skin becomes reddened/irritated stop using the CHG.  Do not shave (including legs and underarms) for at least 48 hours prior to first CHG shower. It is OK to shave your face.  Please follow  these instructions carefully.   1. Shower the NIGHT BEFORE SURGERY and the MORNING OF SURGERY with CHG.   2. If you chose to wash your hair, wash your hair first as usual with your normal shampoo.  3. After you shampoo, rinse your hair and body thoroughly to remove the shampoo.  4. Use CHG as you would any other liquid soap. You can apply CHG directly to the skin and wash gently with a scrungie or a clean washcloth.   5. Apply the CHG Soap to your body ONLY FROM THE NECK DOWN.  Do not use on open wounds or open sores. Avoid contact with your eyes, ears, mouth and genitals (private parts). Wash genitals (private parts) with your normal soap.  6. Wash thoroughly, paying special attention to the area where your surgery will be performed.  7. Thoroughly rinse your body with warm water from the neck down.  8. DO NOT shower/wash with your normal soap after using and rinsing off the CHG Soap.  9. Pat yourself dry with a CLEAN TOWEL.   10. Wear CLEAN PAJAMAS   11. Place CLEAN SHEETS on your bed the night of your first shower and DO NOT SLEEP WITH PETS.    Day of Surgery: Do not apply any deodorants/lotions. Please wear clean clothes to the hospital/surgery center.      Please read over the following fact sheets that you were given.

## 2016-12-18 ENCOUNTER — Encounter (HOSPITAL_COMMUNITY): Payer: Self-pay

## 2016-12-18 ENCOUNTER — Encounter (HOSPITAL_COMMUNITY)
Admission: RE | Admit: 2016-12-18 | Discharge: 2016-12-18 | Disposition: A | Discharge status: Home / Self Care | Payer: Commercial Managed Care - HMO | Source: Ambulatory Visit | Attending: General Surgery | Admitting: General Surgery

## 2016-12-18 DIAGNOSIS — K5792 Diverticulitis of intestine, part unspecified, without perforation or abscess without bleeding: Secondary | ICD-10-CM | POA: Diagnosis not present

## 2016-12-18 DIAGNOSIS — Z01818 Encounter for other preprocedural examination: Secondary | ICD-10-CM | POA: Diagnosis present

## 2016-12-18 HISTORY — DX: Cardiac arrhythmia, unspecified: I49.9

## 2016-12-18 HISTORY — DX: Unspecified osteoarthritis, unspecified site: M19.90

## 2016-12-18 HISTORY — DX: Personal history of urinary calculi: Z87.442

## 2016-12-18 LAB — CBC
HCT: 47 % (ref 39.0–52.0)
Hemoglobin: 16.1 g/dL (ref 13.0–17.0)
MCH: 31.1 pg (ref 26.0–34.0)
MCHC: 34.3 g/dL (ref 30.0–36.0)
MCV: 90.9 fL (ref 78.0–100.0)
Platelets: 190 10*3/uL (ref 150–400)
RBC: 5.17 MIL/uL (ref 4.22–5.81)
RDW: 13 % (ref 11.5–15.5)
WBC: 9.6 10*3/uL (ref 4.0–10.5)

## 2016-12-18 LAB — COMPREHENSIVE METABOLIC PANEL
ALT: 21 U/L (ref 17–63)
AST: 17 U/L (ref 15–41)
Albumin: 4.4 g/dL (ref 3.5–5.0)
Alkaline Phosphatase: 68 U/L (ref 38–126)
Anion gap: 8 (ref 5–15)
BUN: 11 mg/dL (ref 6–20)
CO2: 27 mmol/L (ref 22–32)
Calcium: 9.4 mg/dL (ref 8.9–10.3)
Chloride: 103 mmol/L (ref 101–111)
Creatinine, Ser: 0.88 mg/dL (ref 0.61–1.24)
GFR calc Af Amer: >60 mL/min (ref >60)
GFR calc non Af Amer: >60 mL/min (ref >60)
Glucose, Bld: 101 mg/dL — ABNORMAL HIGH (ref 65–99)
Potassium: 4.3 mmol/L (ref 3.5–5.1)
Sodium: 138 mmol/L (ref 135–145)
Total Bilirubin: 0.7 mg/dL (ref 0.3–1.2)
Total Protein: 6.5 g/dL (ref 6.5–8.1)

## 2016-12-18 LAB — LIPID PANEL
Cholesterol: 208 mg/dL — ABNORMAL HIGH (ref 0–200)
HDL: 40 mg/dL — ABNORMAL LOW (ref >40)
LDL Cholesterol: 149 mg/dL — ABNORMAL HIGH (ref 0–99)
Total CHOL/HDL Ratio: 5.2 RATIO
Triglycerides: 95 mg/dL (ref <150)
VLDL: 19 mg/dL (ref 0–40)

## 2016-12-18 LAB — ABO/RH: ABO/RH(D): O POS

## 2016-12-18 LAB — TYPE AND SCREEN
ABO/RH(D): O POS
Antibody Screen: NEGATIVE

## 2016-12-18 NOTE — Progress Notes (Signed)
SPOKE WITH SYLVIA AT [facility] TO SEE IF PATIENT NEEDED TO STOP ASPRIN 81MG .  SYLVIA STATED SHE WOULD NEED TO CHECK WITH DR. THOMPSON TOMORROW , BUT ACCORDING TO DR, WYATT PATIENT SHOULD NOT HAVE TO STOP.  SYLVIA WILL CALL PATIENT.  CLARIFIED WITH SYLVIA THAT PATIENT SHOULD ONLY HAVE CLEAR LIQUIDS DAY PRIOR TO SURGERY AND NOTHING RED.  WENT OVER BOWEL PREP PROTOCOL WITH PATIENT.  PATIENT STATED HE UNDERSTOOD.

## 2016-12-20 NOTE — H&P (Signed)
Eric Savage 11/14/2016 10:11 AM Location: [facility] Patient #: [id number] DOB: 1966/06/09 Married / Language: [place] / Race: White Male   History of Present Illness [name] MD; 11/14/2016 10:23 AM) The patient is a 51 year old male who presents with diverticulitis. Eric Savage presents for follow-up regarding sigmoid diverticulitis. He was hospitalized 3 Months ago and treated medically. Prior to that, he had 6 episodes of diverticulitis which were treated as an outpatient. Since I last saw him, he is doing well. No significant abdominal pain. He has been moving his bowels regularly.   Allergies Christianne Dolin, [place]; 11/14/2016 10:11 AM) No Known Drug Allergies 10/31/2016  Medication History Christianne Dolin, RMA; 11/14/2016 10:12 AM) NexIUM (40MG  Capsule DR, Oral) Active. Lisinopril (20MG  Tablet, Oral) Active. Simvastatin (20MG  Tablet, Oral) Active. Aspirin (81MG  Tablet, Oral) Active. Medications Reconciled  Vitals Christianne Dolin RMA; 11/14/2016 10:12 AM) 11/14/2016 10:12 AM Weight: 179 lb Height: 64in Body Surface Area: 1.87 m Body Mass Index: 30.72 kg/m  Temp.: 98.18F  Pulse: 74 (Regular)  BP: 112/64 (Sitting, Left Arm, Standard)       Physical Exam [name] MD; 11/14/2016 10:23 AM) General Mental Status-Alert. General Appearance-Consistent with stated age. Hydration-Well hydrated. Voice-Normal.  Head and Neck Head-normocephalic, atraumatic with no lesions or palpable masses. Trachea-midline. Thyroid Gland Characteristics - normal size and consistency.  Eye Eyeball - Bilateral-Extraocular movements intact. Sclera/Conjunctiva - Bilateral-No scleral icterus.  Chest and Lung Exam Chest and lung exam reveals -quiet, even and easy respiratory effort with no use of accessory muscles and on auscultation, normal breath sounds, no adventitious sounds and normal vocal resonance. Inspection Chest Wall  - Normal. Back - normal.  Cardiovascular Cardiovascular examination reveals -normal heart sounds, regular rate and rhythm with no murmurs and normal pedal pulses bilaterally.  Abdomen Inspection Inspection of the abdomen reveals - No Hernias. Palpation/Percussion Palpation and Percussion of the abdomen reveal - Soft, Non Tender, No Rebound tenderness, No Rigidity (guarding) and No hepatosplenomegaly. Auscultation Auscultation of the abdomen reveals - Bowel sounds normal. Note: No further suprapubic or left lower quadrant tenderness at this time, no masses   Neurologic Neurologic evaluation reveals -alert and oriented x 3 with no impairment of recent or remote memory. Mental Status-Normal.  Musculoskeletal Global Assessment -Note: no gross deformities.  Normal Exam - Left-Upper Extremity Strength Normal and Lower Extremity Strength Normal. Normal Exam - Right-Upper Extremity Strength Normal and Lower Extremity Strength Normal.  Lymphatic Head & Neck  General Head & Neck Lymphatics: Bilateral - Description - Normal. Axillary  General Axillary Region: Bilateral - Description - Normal. Tenderness - Non Tender. Femoral & Inguinal  Generalized Femoral & Inguinal Lymphatics: Bilateral - Description - No Generalized lymphadenopathy.    Assessment & Plan [name] MD; 11/14/2016 10:24 AM) DIVERTICULITIS, COLON (K57.32) Impression: I have offered laparoscopic-assisted sigmoid colectomy. We will plan to do this next month to allow some further time for his inflammation to abate. I discussed the procedure, risks, and benefits. I also discussed the planned postoperative course. 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 He will give me a call if he has any further flareup in the interim. I also discussed the bowel prep and answered his questions.   12/21/16 update. On exam in pre-op  holding, the inflamed section of his sigmoid colon is palpable. In light of this, I informed him that there is a chance we  would not be able to put his colon together today and he would have a temporary colostomy. He is not willing to accept this risk and wants to hold off on surgery. He will come back to see me in the office.  Violeta GelinasBurke Kenza Munar, MD, MPH, FACS Trauma: [phone] General Surgery: [phone]

## 2016-12-21 ENCOUNTER — Telehealth: Payer: Self-pay | Admitting: *Deleted

## 2016-12-21 ENCOUNTER — Inpatient Hospital Stay (HOSPITAL_COMMUNITY): Payer: Commercial Managed Care - HMO | Admitting: Anesthesiology

## 2016-12-21 ENCOUNTER — Encounter (HOSPITAL_COMMUNITY)
Admission: RE | Disposition: A | Discharge status: Home / Self Care | Payer: Self-pay | Source: Ambulatory Visit | Attending: General Surgery

## 2016-12-21 ENCOUNTER — Ambulatory Visit (HOSPITAL_COMMUNITY)
Admission: RE | Admit: 2016-12-21 | Discharge: 2016-12-21 | Disposition: A | Discharge status: Home / Self Care | Payer: Commercial Managed Care - HMO | Source: Ambulatory Visit | Attending: General Surgery | Admitting: General Surgery

## 2016-12-21 DIAGNOSIS — E785 Hyperlipidemia, unspecified: Secondary | ICD-10-CM

## 2016-12-21 DIAGNOSIS — Z79899 Other long term (current) drug therapy: Secondary | ICD-10-CM

## 2016-12-21 DIAGNOSIS — Z01818 Encounter for other preprocedural examination: Secondary | ICD-10-CM | POA: Diagnosis not present

## 2016-12-21 DIAGNOSIS — K5792 Diverticulitis of intestine, part unspecified, without perforation or abscess without bleeding: Secondary | ICD-10-CM | POA: Insufficient documentation

## 2016-12-21 SURGERY — CANCELLED PROCEDURE
Anesthesia: General

## 2016-12-21 MED ORDER — ACETAMINOPHEN 500 MG PO TABS
1000.0000 mg | ORAL_TABLET | ORAL | Status: AC
  Administered 2016-12-21: 1000 mg via ORAL
  Filled 2016-12-21: qty 2

## 2016-12-21 MED ORDER — FENTANYL CITRATE (PF) 100 MCG/2ML IJ SOLN
INTRAMUSCULAR | Status: AC
  Filled 2016-12-21: qty 2

## 2016-12-21 MED ORDER — GABAPENTIN 300 MG PO CAPS
300.0000 mg | ORAL_CAPSULE | ORAL | Status: AC
  Administered 2016-12-21: 300 mg via ORAL
  Filled 2016-12-21: qty 1

## 2016-12-21 MED ORDER — ATORVASTATIN CALCIUM 40 MG PO TABS: 40.0000 mg | ORAL_TABLET | Freq: Every day | ORAL | 3 refills | Status: DC

## 2016-12-21 MED ORDER — FENTANYL CITRATE (PF) 100 MCG/2ML IJ SOLN
INTRAMUSCULAR | Status: AC
  Filled 2016-12-21: qty 4

## 2016-12-21 MED ORDER — LACTATED RINGERS IV SOLN
INTRAVENOUS | Status: DC | PRN
  Administered 2016-12-21: 09:00:00 via INTRAVENOUS

## 2016-12-21 MED ORDER — CEFOTETAN DISODIUM-DEXTROSE 2-2.08 GM-% IV SOLR
2.0000 g | INTRAVENOUS | Status: DC
  Filled 2016-12-21: qty 50

## 2016-12-21 MED ORDER — MIDAZOLAM HCL 2 MG/2ML IJ SOLN
INTRAMUSCULAR | Status: AC
  Filled 2016-12-21: qty 2

## 2016-12-21 MED ORDER — CHLORHEXIDINE GLUCONATE CLOTH 2 % EX PADS: 6.0000 | MEDICATED_PAD | Freq: Once | CUTANEOUS | Status: DC

## 2016-12-21 MED ORDER — SUGAMMADEX SODIUM 200 MG/2ML IV SOLN
INTRAVENOUS | Status: AC
  Filled 2016-12-21: qty 2

## 2016-12-21 MED ORDER — LACTATED RINGERS IV SOLN
INTRAVENOUS | Status: DC
  Administered 2016-12-21: 08:00:00 via INTRAVENOUS

## 2016-12-21 MED ORDER — PROPOFOL 10 MG/ML IV BOLUS
INTRAVENOUS | Status: AC
  Filled 2016-12-21: qty 20

## 2016-12-21 MED ORDER — ALVIMOPAN 12 MG PO CAPS
12.0000 mg | ORAL_CAPSULE | Freq: Once | ORAL | Status: AC
  Administered 2016-12-21: 12 mg via ORAL
  Filled 2016-12-21: qty 1

## 2016-12-21 MED ORDER — CELECOXIB 200 MG PO CAPS
400.0000 mg | ORAL_CAPSULE | ORAL | Status: AC
  Administered 2016-12-21: 400 mg via ORAL
  Filled 2016-12-21: qty 2

## 2016-12-21 NOTE — Progress Notes (Signed)
Patient's IV removed and site clean, dry and intact.  Patient refused wheelchair to main entrance.  Ambulated with wife.

## 2016-12-21 NOTE — Telephone Encounter (Signed)
-----   Message from Marykay Lexavid W [name], MD sent at 12/20/2016  1:31 PM EDT ----- Cholesterol panel shows that his levels of gone up significantly since the last check had 3 years ago. LDL is now 149.  Let's switch him from simvastatin 20 to atorvastatin 40 mg and recheck labs in about 4 months.  Bryan Lemmaavid [name], MD  Please forward to Marva PandaKimberly Millsaps, NP

## 2016-12-21 NOTE — Anesthesia Preprocedure Evaluation (Addendum)
Anesthesia Evaluation  Patient identified by MRN, date of birth, ID band Patient awake    Reviewed: Allergy & Precautions, NPO status , Patient's Chart, lab work & pertinent test results  Airway Mallampati: II  TM Distance: >3 FB Neck ROM: Full    Dental  (+) Teeth Intact, Dental Advisory Given   Pulmonary Current Smoker,    breath sounds clear to auscultation       Cardiovascular hypertension,  Rhythm:Regular Rate:Normal     Neuro/Psych    GI/Hepatic   Endo/Other    Renal/GU      Musculoskeletal   Abdominal   Peds  Hematology   Anesthesia Other Findings   Reproductive/Obstetrics                            Anesthesia Physical Anesthesia Plan  ASA: III  Anesthesia Plan: General   Post-op Pain Management:    Induction: Intravenous  Airway Management Planned: Oral ETT  Additional Equipment:   Intra-op Plan:   Post-operative Plan: Extubation in OR  Informed Consent: I have reviewed the patients History and Physical, chart, labs and discussed the procedure including the risks, benefits and alternatives for the proposed anesthesia with the patient or authorized representative who has indicated his/her understanding and acceptance.   Dental advisory given  Plan Discussed with: CRNA and Anesthesiologist  Anesthesia Plan Comments:         Anesthesia Quick Evaluation  

## 2016-12-21 NOTE — Telephone Encounter (Signed)
Spoke to patient. Result given . Verbalized understanding E[facility]  90 DAY SUPPLY X 3 REFILLS   ROUTED RESULTS TO PRIMARY Will mail [facility]  lab slip @ 4 months

## 2016-12-27 ENCOUNTER — Telehealth: Payer: Self-pay | Admitting: Cardiology

## 2016-12-27 NOTE — Telephone Encounter (Signed)
No old think that's probably the symptom. What I would like to try to do is get a day or so off and see if the symptoms improve. Then try taking a half a tablet daily for a week or so to see if that causes symptoms. If not then continue with half tablet. If symptoms are stable after half tablet then I would try to increase to full tablet after a week. This is not a very usual side effects from this medication.   Bryan Lemmaavid [name], MD

## 2016-12-27 NOTE — Telephone Encounter (Signed)
[email or web address] 

## 2016-12-27 NOTE — Telephone Encounter (Signed)
Spoke with pt states that he changed his medication as directed to atorva 40mg  @[facility]  on 12-24-16 he states that Yesterday 3-22 he started having heartburn and diarrhea. Then today he has a headache.    He states that he will stop this medication tonight because of the Side effects and was wondering if you wanted to start another medication to relplace this? Please advise

## 2016-12-27 NOTE — Telephone Encounter (Signed)
New Message  Pt c/o medication issue:  1. Name of Medication: atorvastatin 40 mg tablets once daily  2. How are you currently taking this medication (dosage and times per day)? See above  3. Are you having a reaction (difficulty breathing--STAT)? N/A  4. What is your medication issue? Pt voiced this medication was changed and not it's giving patient heartburn and diarrhea.   Please f/u with pt

## 2016-12-31 NOTE — Telephone Encounter (Signed)
Returned the call to the patient. He stated that he had been taking both the simvastatin and the atorvastatin by mistake. Since he has been taking just the atorvastatin he has not had the symptoms anymore.

## 2017-01-02 ENCOUNTER — Ambulatory Visit: Payer: Self-pay | Admitting: General Surgery

## 2017-02-21 ENCOUNTER — Inpatient Hospital Stay (HOSPITAL_COMMUNITY): Admission: RE | Admit: 2017-02-21 | Payer: Self-pay | Source: Ambulatory Visit

## 2017-03-01 ENCOUNTER — Encounter (HOSPITAL_COMMUNITY): Admission: RE | Payer: Self-pay | Source: Ambulatory Visit

## 2017-03-01 ENCOUNTER — Inpatient Hospital Stay (HOSPITAL_COMMUNITY)
Admission: RE | Admit: 2017-03-01 | Payer: Commercial Managed Care - HMO | Source: Ambulatory Visit | Admitting: General Surgery

## 2017-03-01 SURGERY — COLECTOMY, PARTIAL
Anesthesia: General

## 2017-03-19 ENCOUNTER — Telehealth: Payer: Self-pay | Admitting: *Deleted

## 2017-03-19 DIAGNOSIS — E785 Hyperlipidemia, unspecified: Secondary | ICD-10-CM

## 2017-03-19 DIAGNOSIS — Z79899 Other long term (current) drug therapy: Secondary | ICD-10-CM

## 2017-03-19 NOTE — Telephone Encounter (Signed)
-----   Message from Tobin ChadSharon Martin V, RN sent at 12/21/2016  5:54 PM EDT ----- Labs due July 16,2018 lipid liver Mail @ March 22 2017

## 2017-03-19 NOTE — Telephone Encounter (Signed)
Mail letter and mail labslip 

## 2017-05-15 ENCOUNTER — Ambulatory Visit: Payer: Self-pay | Admitting: General Surgery

## 2017-05-24 ENCOUNTER — Telehealth: Payer: Self-pay | Admitting: Cardiology

## 2017-05-24 NOTE — Telephone Encounter (Signed)
Called patient pharmacy and spoke with [facility] who stated that there is an available Rx for lisinopril 20 mg good until 12/2017. Left message for patient to call back in order to inform him that his Rx is ready for pick up.

## 2017-05-24 NOTE — Telephone Encounter (Signed)
lmtcb

## 2017-05-24 NOTE — Telephone Encounter (Signed)
New message      *STAT* If patient is at the pharmacy, call can be transferred to refill team.   1. Which medications need to be refilled? (please list name of each medication and dose if known)   lisinopril (PRINIVIL,ZESTRIL) 20 MG tablet Take 1 tablet (20 mg total) by mouth daily. Patient taking differently: Take 20 mg by mouth at bedtime.      2. Which pharmacy/location (including street and city if local pharmacy) is medication to be sent to?  [facility] [address]   3. Do they need a 30 day or 90 day supply? 90

## 2017-05-24 NOTE — Telephone Encounter (Signed)
Patient called back and verbalized understanding that Rx is ready for pick up.

## 2017-05-31 ENCOUNTER — Encounter (HOSPITAL_COMMUNITY)
Admission: RE | Admit: 2017-05-31 | Discharge: 2017-05-31 | Disposition: A | Discharge status: Home / Self Care | Payer: 59 | Source: Ambulatory Visit | Attending: General Surgery | Admitting: General Surgery

## 2017-05-31 ENCOUNTER — Encounter (HOSPITAL_COMMUNITY): Payer: Self-pay

## 2017-05-31 DIAGNOSIS — Z01818 Encounter for other preprocedural examination: Secondary | ICD-10-CM | POA: Diagnosis present

## 2017-05-31 DIAGNOSIS — K5732 Diverticulitis of large intestine without perforation or abscess without bleeding: Secondary | ICD-10-CM | POA: Diagnosis not present

## 2017-05-31 LAB — COMPREHENSIVE METABOLIC PANEL
ALT: 22 U/L (ref 17–63)
AST: 14 U/L — ABNORMAL LOW (ref 15–41)
Albumin: 3.8 g/dL (ref 3.5–5.0)
Alkaline Phosphatase: 78 U/L (ref 38–126)
Anion gap: 6 (ref 5–15)
BUN: 12 mg/dL (ref 6–20)
CO2: 28 mmol/L (ref 22–32)
Calcium: 9.2 mg/dL (ref 8.9–10.3)
Chloride: 108 mmol/L (ref 101–111)
Creatinine, Ser: 0.99 mg/dL (ref 0.61–1.24)
GFR calc Af Amer: >60 mL/min (ref >60)
GFR calc non Af Amer: >60 mL/min (ref >60)
Glucose, Bld: 90 mg/dL (ref 65–99)
Potassium: 4 mmol/L (ref 3.5–5.1)
Sodium: 142 mmol/L (ref 135–145)
Total Bilirubin: 0.8 mg/dL (ref 0.3–1.2)
Total Protein: 6.1 g/dL — ABNORMAL LOW (ref 6.5–8.1)

## 2017-05-31 LAB — CBC
HCT: 40.1 % (ref 39.0–52.0)
Hemoglobin: 13.7 g/dL (ref 13.0–17.0)
MCH: 30.5 pg (ref 26.0–34.0)
MCHC: 34.2 g/dL (ref 30.0–36.0)
MCV: 89.3 fL (ref 78.0–100.0)
Platelets: 206 10*3/uL (ref 150–400)
RBC: 4.49 MIL/uL (ref 4.22–5.81)
RDW: 12.1 % (ref 11.5–15.5)
WBC: 8.5 10*3/uL (ref 4.0–10.5)

## 2017-05-31 LAB — TYPE AND SCREEN
ABO/RH(D): O POS
Antibody Screen: NEGATIVE

## 2017-05-31 NOTE — Progress Notes (Signed)
SPOKE WITH WENDY AT [facility] WHO STATED PATIENT COULD CONTINUE  ASPIRIN.

## 2017-05-31 NOTE — Pre-Procedure Instructions (Signed)
Eric Savage  05/31/2017      [id number] [place] [facility], [place] - [address] [place] [address] Phone: [phone] Fax: [phone]    Your procedure is scheduled on  Wednesday  06/05/17  Report to [facility] Admitting at 1030 A.M.  Call this number if you have problems the morning of surgery:  [phone]   Remember:  Do not eat food or drink liquids after midnight.  Take these medicines the morning of surgery with A SIP OF WATER -  NONE  7 days prior to surgery STOP taking any Aleve, Naproxen, Ibuprofen, Motrin, Advil, Goody's, BC's, all herbal medications, fish oil, and all vitamins   Do not wear jewelry, make-up or nail polish.  Do not wear lotions, powders, or perfumes, or deoderant.  Do not shave 48 hours prior to surgery.  Men may shave face and neck.  Do not bring valuables to the hospital.  [facility] is not responsible for any belongings or valuables.  Contacts, dentures or bridgework may not be worn into surgery.  Leave your suitcase in the car.  After surgery it may be brought to your room.  For patients admitted to the hospital, discharge time will be determined by your treatment team.  Patients discharged the day of surgery will not be allowed to drive home.   Name and phone number of your driver:    Special instructions:  [place] - Preparing for Surgery  Before surgery, you can play an important role.  Because skin is not sterile, your skin needs to be as free of germs as possible.  You can reduce the number of germs on you skin by washing with CHG (chlorahexidine gluconate) soap before surgery.  CHG is an antiseptic cleaner which kills germs and bonds with the skin to continue killing germs even after washing.  Please DO NOT use if you have an allergy to CHG or antibacterial soaps.  If your skin becomes reddened/irritated stop using the CHG and inform your nurse when you  arrive at Short Stay.  Do not shave (including legs and underarms) for at least 48 hours prior to the first CHG shower.  You may shave your face.  Please follow these instructions carefully:   1.  Shower with CHG Soap the night before surgery and the                                morning of Surgery.  2.  If you choose to wash your hair, wash your hair first as usual with your       normal shampoo.  3.  After you shampoo, rinse your hair and body thoroughly to remove the                      Shampoo.  4.  Use CHG as you would any other liquid soap.  You can apply chg directly       to the skin and wash gently with scrungie or a clean washcloth.  5.  Apply the CHG Soap to your body ONLY FROM THE NECK DOWN.        Do not use on open wounds or open sores.  Avoid contact with your eyes,       ears, mouth and genitals (private parts).  Wash genitals (private parts)  with your normal soap.  6.  Wash thoroughly, paying special attention to the area where your surgery        will be performed.  7.  Thoroughly rinse your body with warm water from the neck down.  8.  DO NOT shower/wash with your normal soap after using and rinsing off       the CHG Soap.  9.  Pat yourself dry with a clean towel.            10.  Wear clean pajamas.            11.  Place clean sheets on your bed the night of your first shower and do not        sleep with pets.  Day of Surgery  Do not apply any lotions/deoderants the morning of surgery.  Please wear clean clothes to the hospital/surgery center.    Please read over the following fact sheets that you were given. Coughing and Deep Breathing

## 2017-06-03 NOTE — Progress Notes (Signed)
Anesthesia Chart Review:  Pt is a 51 year old male scheduled for sigmoid colectomy for diverticulitis on 06/05/2017 with Violeta Gelinas, MD   Surgery was originally scheduled for surgery 12/21/16 but it was cancelled because colon was inflamed and pt was not willing to risk a colostomy.    - PCP is Marva Panda, NP - Cardiologist is [name], MD. Last office visit 12/06/16; annual f/u recommended.   PMH includes:  CAD (s/p DES to RCA and CX 2010), HTN, hyperlipidemia, diverticulitis, GERD. Current smoker. BMI 31  Medications include: ASA 81 mg, Lipitor, Nexium, lisinopril  BP 126/67   Pulse 64   Temp 37.1 C   Resp 20   Ht 5\' 5"  (1.651 m)   Wt 186 lb 4.8 oz (84.5 kg)   SpO2 98%   BMI 31.00 kg/m    Preoperative labs reviewed.    CXR 09/09/16: No active cardiopulmonary disease.  EKG 12/06/16: NSR  Cardiac cath 01/28/13:  1. Left main: normal 2. LAD: 20% proximal smooth narrowing; 60 - 70% smooth mid diagonal 2 narrowing 3. Left circumflex: normal with patent stent  4. Right coronary artery: normal with patent stent  Echo 05/13/09:  1. LV systolic function normal. EF >55%. Normal LV wall thickness. 2. Mild mitral regurgitation. 3. Trace tricuspid regurgitation.  If no changes, I anticipate pt can proceed with surgery as scheduled.   Rica Mast, FNP-BC [facility] Stay Surgical Center/Anesthesiology Phone: [phone] 06/03/2017 12:28 PM

## 2017-06-05 ENCOUNTER — Inpatient Hospital Stay (HOSPITAL_COMMUNITY): Payer: 59 | Admitting: Emergency Medicine

## 2017-06-05 ENCOUNTER — Encounter (HOSPITAL_COMMUNITY): Payer: Self-pay

## 2017-06-05 ENCOUNTER — Inpatient Hospital Stay (HOSPITAL_COMMUNITY): Payer: 59 | Admitting: Certified Registered Nurse Anesthetist

## 2017-06-05 ENCOUNTER — Encounter (HOSPITAL_COMMUNITY)
Admission: RE | Disposition: A | Discharge status: Home / Self Care | Payer: Self-pay | Source: Ambulatory Visit | Attending: General Surgery

## 2017-06-05 ENCOUNTER — Inpatient Hospital Stay (HOSPITAL_COMMUNITY)
Admission: RE | Admit: 2017-06-05 | Discharge: 2017-06-10 | DRG: 331 | Disposition: A | Discharge status: Home / Self Care | Payer: 59 | Source: Ambulatory Visit | Attending: General Surgery | Admitting: General Surgery

## 2017-06-05 DIAGNOSIS — Z7982 Long term (current) use of aspirin: Secondary | ICD-10-CM

## 2017-06-05 DIAGNOSIS — K5732 Diverticulitis of large intestine without perforation or abscess without bleeding: Secondary | ICD-10-CM | POA: Diagnosis present

## 2017-06-05 DIAGNOSIS — I2510 Atherosclerotic heart disease of native coronary artery without angina pectoris: Secondary | ICD-10-CM | POA: Diagnosis present

## 2017-06-05 DIAGNOSIS — E7800 Pure hypercholesterolemia, unspecified: Secondary | ICD-10-CM | POA: Diagnosis present

## 2017-06-05 DIAGNOSIS — I10 Essential (primary) hypertension: Secondary | ICD-10-CM | POA: Diagnosis present

## 2017-06-05 DIAGNOSIS — Z79899 Other long term (current) drug therapy: Secondary | ICD-10-CM

## 2017-06-05 DIAGNOSIS — K219 Gastro-esophageal reflux disease without esophagitis: Secondary | ICD-10-CM | POA: Diagnosis present

## 2017-06-05 DIAGNOSIS — Z955 Presence of coronary angioplasty implant and graft: Secondary | ICD-10-CM | POA: Diagnosis not present

## 2017-06-05 DIAGNOSIS — F17210 Nicotine dependence, cigarettes, uncomplicated: Secondary | ICD-10-CM | POA: Diagnosis present

## 2017-06-05 DIAGNOSIS — Z9049 Acquired absence of other specified parts of digestive tract: Secondary | ICD-10-CM

## 2017-06-05 HISTORY — PX: LAPAROSCOPIC SIGMOID COLECTOMY: SHX5928

## 2017-06-05 SURGERY — COLECTOMY, SIGMOID, LAPAROSCOPIC
Anesthesia: General | Site: Abdomen

## 2017-06-05 MED ORDER — EPINEPHRINE PF 1 MG/10ML IJ SOSY
PREFILLED_SYRINGE | INTRAMUSCULAR | Status: AC
  Filled 2017-06-05: qty 10

## 2017-06-05 MED ORDER — HYDROMORPHONE HCL 1 MG/ML IJ SOLN
INTRAMUSCULAR | Status: AC
  Administered 2017-06-05: 0.5 mg via INTRAVENOUS
  Filled 2017-06-05: qty 1

## 2017-06-05 MED ORDER — MIDAZOLAM HCL 5 MG/5ML IJ SOLN
INTRAMUSCULAR | Status: DC | PRN
  Administered 2017-06-05: 2 mg via INTRAVENOUS

## 2017-06-05 MED ORDER — ONDANSETRON HCL 4 MG/2ML IJ SOLN
INTRAMUSCULAR | Status: DC | PRN
  Administered 2017-06-05: 4 mg via INTRAVENOUS

## 2017-06-05 MED ORDER — MIDAZOLAM HCL 2 MG/2ML IJ SOLN
INTRAMUSCULAR | Status: AC
Start: 2017-06-05 — End: 2017-06-05
  Filled 2017-06-05: qty 2

## 2017-06-05 MED ORDER — ENOXAPARIN SODIUM 40 MG/0.4ML ~~LOC~~ SOLN
40.0000 mg | SUBCUTANEOUS | Status: DC
  Administered 2017-06-06 – 2017-06-09 (×4): 40 mg via SUBCUTANEOUS
  Filled 2017-06-05 (×4): qty 0.4

## 2017-06-05 MED ORDER — CELECOXIB 200 MG PO CAPS
400.0000 mg | ORAL_CAPSULE | ORAL | Status: AC
  Administered 2017-06-05: 400 mg via ORAL
  Filled 2017-06-05: qty 2

## 2017-06-05 MED ORDER — FENTANYL CITRATE (PF) 100 MCG/2ML IJ SOLN
INTRAMUSCULAR | Status: DC | PRN
  Administered 2017-06-05 (×6): 50 ug via INTRAVENOUS
  Administered 2017-06-05 (×2): 100 ug via INTRAVENOUS

## 2017-06-05 MED ORDER — PANTOPRAZOLE SODIUM 40 MG IV SOLR
40.0000 mg | Freq: Every day | INTRAVENOUS | Status: DC
  Administered 2017-06-05 – 2017-06-06 (×2): 40 mg via INTRAVENOUS
  Filled 2017-06-05 (×2): qty 40

## 2017-06-05 MED ORDER — PROPOFOL 10 MG/ML IV BOLUS
INTRAVENOUS | Status: DC | PRN
  Administered 2017-06-05: 160 mg via INTRAVENOUS

## 2017-06-05 MED ORDER — CHLORHEXIDINE GLUCONATE CLOTH 2 % EX PADS: 6.0000 | MEDICATED_PAD | Freq: Once | CUTANEOUS | Status: DC

## 2017-06-05 MED ORDER — FENTANYL CITRATE (PF) 250 MCG/5ML IJ SOLN
INTRAMUSCULAR | Status: AC
  Filled 2017-06-05: qty 5

## 2017-06-05 MED ORDER — GABAPENTIN 300 MG PO CAPS
300.0000 mg | ORAL_CAPSULE | Freq: Two times a day (BID) | ORAL | Status: DC
  Administered 2017-06-05 – 2017-06-09 (×9): 300 mg via ORAL
  Filled 2017-06-05 (×9): qty 1

## 2017-06-05 MED ORDER — ONDANSETRON HCL 4 MG/2ML IJ SOLN
4.0000 mg | Freq: Four times a day (QID) | INTRAMUSCULAR | Status: DC | PRN
Start: 2017-06-05 — End: 2017-06-10
  Administered 2017-06-05: 4 mg via INTRAVENOUS
  Filled 2017-06-05 (×2): qty 2

## 2017-06-05 MED ORDER — CEFOTETAN DISODIUM-DEXTROSE 2-2.08 GM-% IV SOLR
2.0000 g | INTRAVENOUS | Status: AC
  Administered 2017-06-05: 2 g via INTRAVENOUS
  Filled 2017-06-05: qty 50

## 2017-06-05 MED ORDER — PROMETHAZINE HCL 25 MG/ML IJ SOLN
INTRAMUSCULAR | Status: AC
  Filled 2017-06-05: qty 1

## 2017-06-05 MED ORDER — ALVIMOPAN 12 MG PO CAPS
12.0000 mg | ORAL_CAPSULE | ORAL | Status: AC
  Administered 2017-06-05: 12 mg via ORAL
  Filled 2017-06-05: qty 1

## 2017-06-05 MED ORDER — LIDOCAINE HCL (CARDIAC) 20 MG/ML IV SOLN
INTRAVENOUS | Status: DC | PRN
  Administered 2017-06-05: 50 mg via INTRAVENOUS

## 2017-06-05 MED ORDER — LACTATED RINGERS IV SOLN: INTRAVENOUS | Status: DC

## 2017-06-05 MED ORDER — HYDROMORPHONE 1 MG/ML IV SOLN
INTRAVENOUS | Status: DC
  Administered 2017-06-05: 16:00:00 via INTRAVENOUS
  Administered 2017-06-05: 6 mg via INTRAVENOUS
  Administered 2017-06-06: 20:00:00 via INTRAVENOUS
  Administered 2017-06-06: 4.2 mg via INTRAVENOUS
  Administered 2017-06-06 (×2): 2.4 mg via INTRAVENOUS
  Administered 2017-06-06: 3 mg via INTRAVENOUS
  Administered 2017-06-06: 1.2 mg via INTRAVENOUS
  Administered 2017-06-06: 5.1 mg via INTRAVENOUS
  Administered 2017-06-07: 1.8 mg via INTRAVENOUS
  Administered 2017-06-07: 3 mg via INTRAVENOUS
  Administered 2017-06-07: 2.4 mg via INTRAVENOUS
  Administered 2017-06-07: 2.7 mg via INTRAVENOUS
  Administered 2017-06-07: 1.2 mg via INTRAVENOUS
  Filled 2017-06-05: qty 25

## 2017-06-05 MED ORDER — ONDANSETRON HCL 4 MG/2ML IJ SOLN
INTRAMUSCULAR | Status: AC
  Filled 2017-06-05: qty 2

## 2017-06-05 MED ORDER — ONDANSETRON 4 MG PO TBDP: 4.0000 mg | ORAL_TABLET | Freq: Four times a day (QID) | ORAL | Status: DC | PRN

## 2017-06-05 MED ORDER — LISINOPRIL 20 MG PO TABS
20.0000 mg | ORAL_TABLET | Freq: Every day | ORAL | Status: DC
  Administered 2017-06-05 – 2017-06-09 (×5): 20 mg via ORAL
  Filled 2017-06-05 (×5): qty 1

## 2017-06-05 MED ORDER — DIPHENHYDRAMINE HCL 12.5 MG/5ML PO ELIX
12.5000 mg | ORAL_SOLUTION | Freq: Four times a day (QID) | ORAL | Status: DC | PRN
  Filled 2017-06-05: qty 10

## 2017-06-05 MED ORDER — KCL IN DEXTROSE-NACL 20-5-0.45 MEQ/L-%-% IV SOLN
INTRAVENOUS | Status: DC
  Administered 2017-06-05 – 2017-06-08 (×6): via INTRAVENOUS
  Filled 2017-06-05 (×7): qty 1000

## 2017-06-05 MED ORDER — LIDOCAINE 2% (20 MG/ML) 5 ML SYRINGE
INTRAMUSCULAR | Status: AC
  Filled 2017-06-05: qty 5

## 2017-06-05 MED ORDER — MEPERIDINE HCL 25 MG/ML IJ SOLN: 6.2500 mg | INTRAMUSCULAR | Status: DC | PRN

## 2017-06-05 MED ORDER — METHOCARBAMOL 1000 MG/10ML IJ SOLN
1000.0000 mg | Freq: Three times a day (TID) | INTRAVENOUS | Status: DC | PRN
  Administered 2017-06-06 – 2017-06-09 (×3): 1000 mg via INTRAVENOUS
  Filled 2017-06-05 (×3): qty 10

## 2017-06-05 MED ORDER — CELECOXIB 200 MG PO CAPS
200.0000 mg | ORAL_CAPSULE | Freq: Two times a day (BID) | ORAL | Status: DC
  Administered 2017-06-05 – 2017-06-09 (×9): 200 mg via ORAL
  Filled 2017-06-05 (×9): qty 1

## 2017-06-05 MED ORDER — HYDROMORPHONE HCL 1 MG/ML IJ SOLN
0.2500 mg | INTRAMUSCULAR | Status: DC | PRN
  Administered 2017-06-05 (×4): 0.5 mg via INTRAVENOUS

## 2017-06-05 MED ORDER — ROCURONIUM BROMIDE 10 MG/ML (PF) SYRINGE
PREFILLED_SYRINGE | INTRAVENOUS | Status: AC
  Filled 2017-06-05: qty 5

## 2017-06-05 MED ORDER — LACTATED RINGERS IV SOLN
INTRAVENOUS | Status: DC
  Administered 2017-06-05 (×3): via INTRAVENOUS

## 2017-06-05 MED ORDER — ROCURONIUM BROMIDE 100 MG/10ML IV SOLN
INTRAVENOUS | Status: DC | PRN
  Administered 2017-06-05: 20 mg via INTRAVENOUS
  Administered 2017-06-05: 50 mg via INTRAVENOUS
  Administered 2017-06-05: 10 mg via INTRAVENOUS

## 2017-06-05 MED ORDER — DEXAMETHASONE SODIUM PHOSPHATE 10 MG/ML IJ SOLN
INTRAMUSCULAR | Status: DC | PRN
  Administered 2017-06-05: 10 mg via INTRAVENOUS

## 2017-06-05 MED ORDER — ALVIMOPAN 12 MG PO CAPS
12.0000 mg | ORAL_CAPSULE | Freq: Two times a day (BID) | ORAL | Status: DC
  Administered 2017-06-06 – 2017-06-09 (×7): 12 mg via ORAL
  Filled 2017-06-05 (×7): qty 1

## 2017-06-05 MED ORDER — PROMETHAZINE HCL 25 MG/ML IJ SOLN
6.2500 mg | INTRAMUSCULAR | Status: DC | PRN
  Administered 2017-06-05: 12.5 mg via INTRAVENOUS

## 2017-06-05 MED ORDER — SUGAMMADEX SODIUM 200 MG/2ML IV SOLN
INTRAVENOUS | Status: AC
  Filled 2017-06-05: qty 2

## 2017-06-05 MED ORDER — SUGAMMADEX SODIUM 200 MG/2ML IV SOLN
INTRAVENOUS | Status: DC | PRN
  Administered 2017-06-05: 350 mg via INTRAVENOUS

## 2017-06-05 MED ORDER — DEXAMETHASONE SODIUM PHOSPHATE 10 MG/ML IJ SOLN
INTRAMUSCULAR | Status: AC
  Filled 2017-06-05: qty 1

## 2017-06-05 MED ORDER — SODIUM CHLORIDE 0.9 % IR SOLN
Status: DC | PRN
  Administered 2017-06-05 (×4): 1000 mL

## 2017-06-05 MED ORDER — HYDRALAZINE HCL 20 MG/ML IJ SOLN: 10.0000 mg | INTRAMUSCULAR | Status: DC | PRN

## 2017-06-05 MED ORDER — HYDROMORPHONE 1 MG/ML IV SOLN
INTRAVENOUS | Status: AC
  Filled 2017-06-05: qty 25

## 2017-06-05 MED ORDER — DIPHENHYDRAMINE HCL 50 MG/ML IJ SOLN
12.5000 mg | Freq: Four times a day (QID) | INTRAMUSCULAR | Status: DC | PRN
  Administered 2017-06-06 – 2017-06-07 (×2): 12.5 mg via INTRAVENOUS
  Filled 2017-06-05 (×2): qty 1

## 2017-06-05 MED ORDER — NALOXONE HCL 0.4 MG/ML IJ SOLN
0.4000 mg | INTRAMUSCULAR | Status: DC | PRN
Start: 2017-06-05 — End: 2017-06-10

## 2017-06-05 MED ORDER — ACETAMINOPHEN 500 MG PO TABS
1000.0000 mg | ORAL_TABLET | ORAL | Status: AC
  Administered 2017-06-05: 1000 mg via ORAL
  Filled 2017-06-05: qty 2

## 2017-06-05 MED ORDER — NICOTINE 21 MG/24HR TD PT24
21.0000 mg | MEDICATED_PATCH | Freq: Every day | TRANSDERMAL | Status: DC
  Administered 2017-06-05 – 2017-06-07 (×3): 21 mg via TRANSDERMAL
  Filled 2017-06-05 (×5): qty 1

## 2017-06-05 MED ORDER — PROPOFOL 10 MG/ML IV BOLUS
INTRAVENOUS | Status: AC
Start: 2017-06-05 — End: 2017-06-05
  Filled 2017-06-05: qty 20

## 2017-06-05 MED ORDER — GABAPENTIN 300 MG PO CAPS
300.0000 mg | ORAL_CAPSULE | ORAL | Status: AC
  Administered 2017-06-05: 300 mg via ORAL
  Filled 2017-06-05: qty 1

## 2017-06-05 MED ORDER — ONDANSETRON HCL 4 MG/2ML IJ SOLN: 4.0000 mg | Freq: Four times a day (QID) | INTRAMUSCULAR | Status: DC | PRN

## 2017-06-05 MED ORDER — SODIUM CHLORIDE 0.9% FLUSH: 9.0000 mL | INTRAVENOUS | Status: DC | PRN

## 2017-06-05 SURGICAL SUPPLY — 76 items
APPLIER CLIP 5 13 M/L LIGAMAX5 (MISCELLANEOUS)
APPLIER CLIP ROT 10 11.4 M/L (STAPLE)
APR CLP MED LRG 11.4X10 (STAPLE)
APR CLP MED LRG 5 ANG JAW (MISCELLANEOUS)
BLADE CLIPPER SURG (BLADE) ×1 IMPLANT
CANISTER SUCT 3000ML PPV (MISCELLANEOUS) ×2 IMPLANT
CELLS DAT CNTRL 66122 CELL SVR (MISCELLANEOUS) IMPLANT
CHLORAPREP W/TINT 26ML (MISCELLANEOUS) ×2 IMPLANT
CLIP APPLIE 5 13 M/L LIGAMAX5 (MISCELLANEOUS) IMPLANT
CLIP APPLIE ROT 10 11.4 M/L (STAPLE) IMPLANT
COVER MAYO STAND STRL (DRAPES) ×4 IMPLANT
COVER SURGICAL LIGHT HANDLE (MISCELLANEOUS) ×4 IMPLANT
DRAPE HALF SHEET 40X57 (DRAPES) ×3 IMPLANT
DRAPE UTILITY XL STRL (DRAPES) ×6 IMPLANT
DRAPE WARM FLUID 44X44 (DRAPE) ×2 IMPLANT
DRSG OPSITE 4X5.5 SM (GAUZE/BANDAGES/DRESSINGS) ×1 IMPLANT
DRSG OPSITE POSTOP 4X10 (GAUZE/BANDAGES/DRESSINGS) ×1 IMPLANT
DRSG OPSITE POSTOP 4X8 (GAUZE/BANDAGES/DRESSINGS) IMPLANT
ELECT BLADE 6.5 EXT (BLADE) ×2 IMPLANT
ELECT CAUTERY BLADE 6.4 (BLADE) ×4 IMPLANT
ELECT REM PT RETURN 9FT ADLT (ELECTROSURGICAL) ×2
ELECTRODE REM PT RTRN 9FT ADLT (ELECTROSURGICAL) ×1 IMPLANT
GAUZE SPONGE 4X4 16PLY XRAY LF (GAUZE/BANDAGES/DRESSINGS) ×1 IMPLANT
GEL ULTRASOUND 20GR AQUASONIC (MISCELLANEOUS) IMPLANT
GLOVE BIO SURGEON STRL SZ7 (GLOVE) ×2 IMPLANT
GLOVE BIO SURGEON STRL SZ7.5 (GLOVE) ×3 IMPLANT
GLOVE BIO SURGEON STRL SZ8 (GLOVE) ×6 IMPLANT
GLOVE BIOGEL PI IND STRL 7.0 (GLOVE) IMPLANT
GLOVE BIOGEL PI IND STRL 8 (GLOVE) ×2 IMPLANT
GLOVE BIOGEL PI INDICATOR 7.0 (GLOVE) ×2
GLOVE BIOGEL PI INDICATOR 8 (GLOVE) ×6
GOWN STRL REUS W/ TWL LRG LVL3 (GOWN DISPOSABLE) ×6 IMPLANT
GOWN STRL REUS W/ TWL XL LVL3 (GOWN DISPOSABLE) ×2 IMPLANT
GOWN STRL REUS W/TWL LRG LVL3 (GOWN DISPOSABLE) ×10
GOWN STRL REUS W/TWL XL LVL3 (GOWN DISPOSABLE) ×8
KIT BASIN OR (CUSTOM PROCEDURE TRAY) ×2 IMPLANT
LEGGING LITHOTOMY PAIR STRL (DRAPES) ×2 IMPLANT
LIGASURE IMPACT 36 18CM CVD LR (INSTRUMENTS) ×1 IMPLANT
NS IRRIG 1000ML POUR BTL (IV SOLUTION) ×4 IMPLANT
PAD ARMBOARD 7.5X6 YLW CONV (MISCELLANEOUS) ×3 IMPLANT
PENCIL BUTTON HOLSTER BLD 10FT (ELECTRODE) ×4 IMPLANT
RETRACTOR WND ALEXIS 18 MED (MISCELLANEOUS) IMPLANT
RTRCTR WOUND ALEXIS 18CM MED (MISCELLANEOUS)
SCISSORS LAP 5X35 DISP (ENDOMECHANICALS) ×1 IMPLANT
SET IRRIG TUBING LAPAROSCOPIC (IRRIGATION / IRRIGATOR) IMPLANT
SHEARS HARMONIC ACE PLUS 36CM (ENDOMECHANICALS) ×1 IMPLANT
SLEEVE ENDOPATH XCEL 5M (ENDOMECHANICALS) ×1 IMPLANT
SPECIMEN JAR LARGE (MISCELLANEOUS) ×3 IMPLANT
STAPLER CIRC CVD 29MM 37CM (STAPLE) ×1 IMPLANT
STAPLER CUT CVD 40MM BLUE (STAPLE) ×1 IMPLANT
STAPLER PROXIMATE 75MM BLUE (STAPLE) ×1 IMPLANT
STAPLER VISISTAT 35W (STAPLE) ×2 IMPLANT
SUCTION POOLE TIP (SUCTIONS) ×2 IMPLANT
SURGILUBE 2OZ TUBE FLIPTOP (MISCELLANEOUS) ×2 IMPLANT
SUT PDS AB 1 TP1 96 (SUTURE) ×4 IMPLANT
SUT PROLENE 2 0 CT2 30 (SUTURE) ×1 IMPLANT
SUT PROLENE 2 0 KS (SUTURE) IMPLANT
SUT SILK 2 0 SH CR/8 (SUTURE) ×5 IMPLANT
SUT SILK 2 0 TIES 10X30 (SUTURE) ×2 IMPLANT
SUT SILK 3 0 SH CR/8 (SUTURE) ×2 IMPLANT
SUT SILK 3 0 TIES 10X30 (SUTURE) ×2 IMPLANT
SYR BULB IRRIGATION 50ML (SYRINGE) ×2 IMPLANT
SYS LAPSCP GELPORT 120MM (MISCELLANEOUS)
SYSTEM LAPSCP GELPORT 120MM (MISCELLANEOUS) IMPLANT
TOWEL OR 17X26 10 PK STRL BLUE (TOWEL DISPOSABLE) ×4 IMPLANT
TRAY FOLEY W/METER SILVER 16FR (SET/KITS/TRAYS/PACK) ×2 IMPLANT
TRAY LAPAROSCOPIC MC (CUSTOM PROCEDURE TRAY) ×2 IMPLANT
TRAY PROCTOSCOPIC FIBER OPTIC (SET/KITS/TRAYS/PACK) ×2 IMPLANT
TROCAR XCEL 12X100 BLDLESS (ENDOMECHANICALS) IMPLANT
TROCAR XCEL BLUNT TIP 100MML (ENDOMECHANICALS) IMPLANT
TROCAR XCEL NON-BLD 11X100MML (ENDOMECHANICALS) IMPLANT
TROCAR XCEL NON-BLD 5MMX100MML (ENDOMECHANICALS) ×1 IMPLANT
TUBE CONNECTING 12X1/4 (SUCTIONS) ×4 IMPLANT
TUBING INSUF HEATED (TUBING) ×1 IMPLANT
TUBING INSUFFLATION (TUBING) ×1 IMPLANT
YANKAUER SUCT BULB TIP NO VENT (SUCTIONS) ×4 IMPLANT

## 2017-06-05 NOTE — Op Note (Signed)
06/05/2017  3:23 PM  PATIENT:  Eric Savage  51 y.o. male  PRE-OPERATIVE DIAGNOSIS:  Recurrent sigmoid diverticulitis  POST-OPERATIVE DIAGNOSIS:  Recurrent sigmoid diverticulitis  PROCEDURE:  Procedure(s): SIGMOID COLECTOMY  SURGEON:  Violeta GelinasBurke Jayna Mulnix, MD  ASSISTANTS: Chevis PrettyPaul Toth, MD   ANESTHESIA:   general  EBL:  Total I/O In: 2300 [I.V.:2300] Out: 75 [Urine:75]  BLOOD ADMINISTERED:none  DRAINS: none   SPECIMEN:  Excision  DISPOSITION OF SPECIMEN:  PATHOLOGY  COUNTS:  YES  DICTATION: .Dragon Dictation Findings: chronic inflammation of the sigmoid colon  Procedure in detail: Mr. Electa SniffBarnett is a transverse sigmoid colectomy. He underwent a colon prep which she tolerated well. He is on the ERAS and Entereg protocols. Informed consent was obtained. He received intravenous antibiotics. He was taken to the operating room. General endotracheal anesthesia was administered by the anesthesia staff. Foley catheter was placed by nursing. He was placed in lithotomy position. Abdomen and perineum were prepped and draped in sterile fashion. Time out procedure was performed. Midline incision was made just above the umbilicus inferiorly. Subcutaneous tissues were dissected down to the anterior fascia. This was divided sharply along the midline and the perineal cavity was entered under direct vision. Exploration revealed a very inflamed segment of sigmoid colon as expected. The rectosigmoid was without disease and the proximal sigmoid colon was also soft. The sigmoid colon and descending colon were mobilized from lateral peritoneal attachments. White line. Further mobilization was continued of the sigmoid colon where it was very chronically inflamed and stuck to the lateral abdominal wall. We identified the ureter and stayed away from that. Careful dissection was undertaken and freed up nicely. We then mobilized the colon further up towards the splenic flexure as well in order to facilitate the  completion of our anastomosis later. The sigmoid colon was divided proximally to the area of disease with a GIA-75 stapler. We then took down the mesentery with a combination of LigaSure and suture ligatures. The rectosigmoid was divided with a contour stapler. The staple line was reinforced with interrupted 2-0 silk sutures as the patient's colon wall is very muscular. Next, we opened into the proximal portion of the colon and EEA sizers were placed. A 29 sizer fit nicely. Pursestring 2-0 prolene was placed. Several silk sutures were also placed to snug the end of the colon around the anvil. Next an EEA stapler was passed from below and we did a standard anastomosis. Stapler fired easily. There was a small gap, however, in one of the donuts. Therefore, we placed multiple interrupted 203 0 silk sutures to reinforce the anastomosis. It was nicely viable and seemed to come together without any tension. Next, the assistant went down below again and performed rigid sigmoidoscopy. The colon was occluded proximally and filled with air from below. The abdomen was filled with saline and there were no air bubbles whatsoever. Again the anastomosis appeared pink and viable. There was no bleeding. Next the abdomen was copiously irrigated. Sponge counts were confirmed to be correct. We did the: Protocol changing drapes and counts, globes, and instruments. Hemostasis was ensured. Bowel was returned to anatomic position. Omentum was brought over the bowel and the fascia was closed with 2 lengths of #1 PDS looped. Subcutaneous tissues were irrigated and the skin was closed with staples. He tolerated the procedure well without apparent complication and was taken to the recovery room in stable condition.All counts were correct. PATIENT DISPOSITION:  PACU - hemodynamically stable.   Delay start of Pharmacological VTE agent (>  24hrs) due to surgical blood loss or risk of bleeding:  no  Violeta Gelinas, MD, MPH, FACS Pager:  [phone]  8/29/20183:23 PM

## 2017-06-05 NOTE — Progress Notes (Signed)
Orthopedic Tech Progress Note Patient Details:  Eric Savage 1966/01/09 [id number]  Ortho Devices Type of Ortho Device: Abdominal binder Ortho Device/Splint Interventions: Eric Savage 06/05/2017, 3:40 PM

## 2017-06-05 NOTE — Transfer of Care (Signed)
Immediate Anesthesia Transfer of Care Note  Patient: AARIS YACKEL  Procedure(s) Performed: Procedure(s): SIGMOID COLECTOMY (N/A)  Patient Location: PACU  Anesthesia Type:General  Level of Consciousness: awake, alert  and oriented  Airway & Oxygen Therapy: Patient Spontanous Breathing and Patient connected to nasal cannula oxygen  Post-op Assessment: Report given to RN and Post -op Vital signs reviewed and stable  Post vital signs: Reviewed and stable  Last Vitals:  Vitals:   06/05/17 1039  BP: 132/80  Pulse: (!) 53  Resp: 20  Temp: 36.8 C  SpO2: 97%    Last Pain:  Vitals:   06/05/17 1039  TempSrc: Oral      Patients Stated Pain Goal: 3 (06/05/17 1057)  Complications: No apparent anesthesia complications

## 2017-06-05 NOTE — Anesthesia Procedure Notes (Signed)
Procedure Name: Intubation Date/Time: 06/05/2017 12:54 PM Performed by: Manuela Schwartz B Pre-anesthesia Checklist: Patient identified, Emergency Drugs available, Suction available and Patient being monitored Patient Re-evaluated:Patient Re-evaluated prior to induction Oxygen Delivery Method: Circle System Utilized Preoxygenation: Pre-oxygenation with 100% oxygen Induction Type: IV induction Ventilation: Mask ventilation without difficulty Laryngoscope Size: Mac and 3 Grade View: Grade I Tube type: Oral Tube size: 7.5 mm Number of attempts: 1 Airway Equipment and Method: Stylet and Oral airway Placement Confirmation: ETT inserted through vocal cords under direct vision,  positive ETCO2 and breath sounds checked- equal and bilateral Secured at: 22 cm Tube secured with: Tape Dental Injury: Teeth and Oropharynx as per pre-operative assessment

## 2017-06-05 NOTE — Anesthesia Preprocedure Evaluation (Addendum)
Anesthesia Evaluation  Patient identified by MRN, date of birth, ID band Patient awake    Reviewed: Allergy & Precautions, NPO status , Patient's Chart, lab work & pertinent test results  Airway Mallampati: II  TM Distance: >3 FB Neck ROM: Full    Dental  (+) Dental Advisory Given   Pulmonary Current Smoker,    breath sounds clear to auscultation       Cardiovascular hypertension, Pt. on medications + CAD and + Cardiac Stents  + dysrhythmias  Rhythm:Regular Rate:Normal     Neuro/Psych negative neurological ROS     GI/Hepatic Neg liver ROS, GERD  ,  Endo/Other  negative endocrine ROS  Renal/GU negative Renal ROS     Musculoskeletal  (+) Arthritis ,   Abdominal   Peds  Hematology negative hematology ROS (+)   Anesthesia Other Findings Day of surgery medications reviewed with the patient.  Reproductive/Obstetrics                            Anesthesia Physical Anesthesia Plan  ASA: III  Anesthesia Plan: General   Post-op Pain Management:    Induction: Intravenous  PONV Risk Score and Plan: 2 and Ondansetron and Dexamethasone  Airway Management Planned: Oral ETT  Additional Equipment:   Intra-op Plan:   Post-operative Plan: Extubation in OR  Informed Consent: I have reviewed the patients History and Physical, chart, labs and discussed the procedure including the risks, benefits and alternatives for the proposed anesthesia with the patient or authorized representative who has indicated his/her understanding and acceptance.   Dental advisory given  Plan Discussed with: CRNA  Anesthesia Plan Comments:         Anesthesia Quick Evaluation

## 2017-06-05 NOTE — Interval H&P Note (Signed)
History and Physical Interval Note:  06/05/2017 12:31 PM  Eric Savage  has presented today for surgery, with the diagnosis of Diverticulitis  The various methods of treatment have been discussed with the patient and family. After consideration of risks, benefits and other options for treatment, the patient has consented to  Procedure(s): SIGMOID COLECTOMY (N/A) as a surgical intervention .  The patient's history has been reviewed, patient examined, no change in status, stable for surgery.  I have reviewed the patient's chart and labs.  Questions were answered to the patient's satisfaction.     Tyshea Imel E

## 2017-06-05 NOTE — H&P (Signed)
Eric Savage is an 51 y.o. male.   Chief Complaint: diverticulitis HPI: HX of recurrent sigmoid diverticulitis. For sigmoid colectomy. Tolerated prep and ERAS protocol. No recent symptoms.  Past Medical History:  Diagnosis Date  . Acid reflux   . Arthritis   . CAD S/P percutaneous coronary angioplasty 12/10   RCA/CFX DES  . Chest pain 01/2013   Evaluate cardiac catheterization, no obstructive CAD  . Diverticulitis   . Dysrhythmia    OCC PALPITATION   . High cholesterol   . History of kidney stones   . Hypertension     Past Surgical History:  Procedure Laterality Date  . CORONARY ANGIOPLASTY WITH STENT PLACEMENT  09/2009   Proximal RCA - Cypher DES 3.0 mm but 33 mm postdilated to 3.7 mm; proximal CFX XIENCE V. DES 2.75 mm x 15 mm postdilated to 3.0 mm  . LEFT HEART CATHETERIZATION WITH CORONARY ANGIOGRAM N/A 05/19/2012   Procedure: LEFT HEART CATHETERIZATION WITH CORONARY ANGIOGRAM;  Surgeon: Marykay Lexavid W [name], MD;  [facility];  Service: Cardiovascular;  Laterality: N/A;  . LEFT HEART CATHETERIZATION WITH CORONARY ANGIOGRAM N/A 01/28/2013   Procedure: LEFT HEART CATHETERIZATION WITH CORONARY ANGIOGRAM;  Surgeon: Lennette Biharihomas A Kelly, MD;  [facility];  Service: Cardiovascular:  No evidence for restenosis of either RCA or LCX. Mild LAD 20% narrowing and 60 - 70% smooth mid diagonal 2 stenosis.  Marland Kitchen. NM MYOCAR PERF WALL MOTION  01/04/2011   Protocol:Bruce, post stress EF=69%, Exercise Cap 13 METS, attenuation defect in inferior region of myocardium, no sig. ischemia demonstrated  . TONSILLECTOMY  ~ 1973  . TRANSTHORACIC ECHOCARDIOGRAM  05/13/2009   EF =>55%, mild mitral regurg, trace tricuspid regurg.    Family History  Problem Relation Age of Onset  . CAD Father 2762       CABG   Social History:  reports that he has been smoking Cigarettes.  He has a 7.50 pack-year smoking history. He has never used smokeless tobacco. He reports that he drinks about 2.4 oz of alcohol  per week . He reports that he does not use drugs.  Allergies: No Known Allergies  Medications Prior to Admission  Medication Sig Dispense Refill  . aspirin EC 81 MG tablet Take 81 mg by mouth at bedtime.    Marland Kitchen. atorvastatin (LIPITOR) 40 MG tablet Take 1 tablet (40 mg total) by mouth daily. (Patient taking differently: Take 40 mg by mouth every evening. ) 90 tablet 3  . Esomeprazole Magnesium 20 MG TBEC Take 20 mg by mouth every evening.     Marland Kitchen. lisinopril (PRINIVIL,ZESTRIL) 20 MG tablet Take 1 tablet (20 mg total) by mouth daily. (Patient taking differently: Take 20 mg by mouth at bedtime. ) 90 tablet 3    No results found for this or any previous visit (from the past 48 hour(s)). No results found.  ROS  Blood pressure 132/80, pulse (!) 53, temperature 98.2 F (36.8 C), temperature source Oral, resp. rate 20, height 5\' 5"  (1.651 m), weight 84.4 kg (186 lb), SpO2 97 %. Physical Exam  Constitutional: He is oriented to person, place, and time. He appears well-developed and well-nourished.  HENT:  Head: Normocephalic.  Eyes: Pupils are equal, round, and reactive to light.  Neck: Neck supple. No thyromegaly present.  Cardiovascular: Normal rate, regular rhythm and normal heart sounds.   Respiratory: Effort normal and breath sounds normal.  GI: Soft. He exhibits no distension and no mass. There is no tenderness.  Neurological: He  is alert and oriented to person, place, and time.  Skin: Skin is warm and dry.     Assessment/Plan Recurrent sigmoid diverticulitis - for sigmoid colectomy. Procedure, risks, benefits discussed and he agrees. We discussed the expected post-op course and the good chance of relieving his symptoms. ERAS and Entereg.  [name], MD 06/05/2017, 12:20 PM

## 2017-06-06 ENCOUNTER — Encounter (HOSPITAL_COMMUNITY): Payer: Self-pay | Admitting: General Surgery

## 2017-06-06 LAB — CBC
HCT: 43.1 % (ref 39.0–52.0)
HCT: 41.3 % (ref 39.0–52.0)
Hemoglobin: 14.6 g/dL (ref 13.0–17.0)
Hemoglobin: 13.8 g/dL (ref 13.0–17.0)
MCH: 30.7 pg (ref 26.0–34.0)
MCH: 30.5 pg (ref 26.0–34.0)
MCHC: 33.9 g/dL (ref 30.0–36.0)
MCHC: 33.4 g/dL (ref 30.0–36.0)
MCV: 90.5 fL (ref 78.0–100.0)
MCV: 91.2 fL (ref 78.0–100.0)
Platelets: 227 10*3/uL (ref 150–400)
Platelets: 219 10*3/uL (ref 150–400)
RBC: 4.76 MIL/uL (ref 4.22–5.81)
RBC: 4.53 MIL/uL (ref 4.22–5.81)
RDW: 12.4 % (ref 11.5–15.5)
RDW: 12.4 % (ref 11.5–15.5)
WBC: 16.8 10*3/uL — ABNORMAL HIGH (ref 4.0–10.5)
WBC: 16.8 10*3/uL — ABNORMAL HIGH (ref 4.0–10.5)

## 2017-06-06 LAB — BASIC METABOLIC PANEL
Anion gap: 6 (ref 5–15)
BUN: 9 mg/dL (ref 6–20)
CO2: 25 mmol/L (ref 22–32)
Calcium: 8.6 mg/dL — ABNORMAL LOW (ref 8.9–10.3)
Chloride: 107 mmol/L (ref 101–111)
Creatinine, Ser: 1.02 mg/dL (ref 0.61–1.24)
GFR calc Af Amer: >60 mL/min (ref >60)
GFR calc non Af Amer: >60 mL/min (ref >60)
Glucose, Bld: 132 mg/dL — ABNORMAL HIGH (ref 65–99)
Potassium: 4.4 mmol/L (ref 3.5–5.1)
Sodium: 138 mmol/L (ref 135–145)

## 2017-06-06 LAB — CREATININE, SERUM
Creatinine, Ser: 1.06 mg/dL (ref 0.61–1.24)
GFR calc Af Amer: >60 mL/min (ref >60)
GFR calc non Af Amer: >60 mL/min (ref >60)

## 2017-06-06 MED ORDER — ALUM & MAG HYDROXIDE-SIMETH 200-200-20 MG/5ML PO SUSP
30.0000 mL | ORAL | Status: DC | PRN
  Administered 2017-06-06 (×2): 30 mL via ORAL
  Filled 2017-06-06 (×2): qty 30

## 2017-06-06 MED ORDER — ORAL CARE MOUTH RINSE
15.0000 mL | Freq: Two times a day (BID) | OROMUCOSAL | Status: DC
  Administered 2017-06-06 – 2017-06-07 (×3): 15 mL via OROMUCOSAL

## 2017-06-06 NOTE — Plan of Care (Signed)
Patient admitted from PACU via stretcher s/p sigmoid colectomy.Alert and oriented x4, honeycomb dressing to abdomen with old marked drainage.Oriented to room and call bell.

## 2017-06-06 NOTE — Evaluation (Signed)
Physical Therapy Evaluation Patient Details Name: Eric Savage MRN: [id number] DOB: Feb 06, 1966 Today's Date: 06/06/2017   History of Present Illness  Pt is a 51 y.o. male s/p sigmoid colectomy due to recurrent diverticulitis.  Other PMH consists of OA, HTN, and CAD.  Clinical Impression  Pt admitted with above diagnosis. Pt currently with functional limitations due to the deficits listed below (see PT Problem List). On eval, pt required min guard assist transfers and ambulation 250 feet with RW. Increased time required for all functional mobility. Pt reports he will have 24-hour assist at home from family in needed. Pt very motivated to participate with therapy.  Pt will benefit from skilled PT to increase their independence and safety with mobility to allow discharge to the venue listed below.       Follow Up Recommendations No PT follow up;Supervision - Intermittent    Equipment Recommendations  Rolling walker with 5" wheels    Recommendations for Other Services       Precautions / Restrictions Precautions Precautions: None Required Braces or Orthoses: Other Brace/Splint Other Brace/Splint: abdominal binder      Mobility  Bed Mobility               General bed mobility comments: Pt up in recliner.  Transfers Overall transfer level: Needs assistance Equipment used: Rolling walker (2 wheeled) Transfers: Sit to/from Stand Sit to Stand: Min guard         General transfer comment: verbal cues for hand placement. Increased time to complete.  Ambulation/Gait Ambulation/Gait assistance: Min guard Ambulation Distance (Feet): 250 Feet Assistive device: Rolling walker (2 wheeled) Gait Pattern/deviations: Step-through pattern;Decreased stride length Gait velocity: very slow Gait velocity interpretation: Below normal speed for age/gender General Gait Details: verbal cues for posture  Stairs            Wheelchair Mobility    Modified Rankin (Stroke Patients  Only)       Balance                                             Pertinent Vitals/Pain Pain Assessment: 0-10 Pain Score: 7  Pain Location: abdomen Pain Descriptors / Indicators: Guarding;Grimacing;Sore Pain Intervention(s): Repositioned;Monitored during session;PCA encouraged    Home Living Family/patient expects to be discharged to:: Private residence Living Arrangements: Spouse/significant other Available Help at Discharge: Family;Available 24 hours/day Type of Home: House Home Access: Stairs to enter Entrance Stairs-Rails: Doctor, general practice of Steps: 2 Home Layout: Able to live on main level with bedroom/bathroom Home Equipment: None Additional Comments: House is over 24 years old.    Prior Function Level of Independence: Independent               Hand Dominance        Extremity/Trunk Assessment                Communication   Communication: No difficulties  Cognition Arousal/Alertness: Awake/alert Behavior During Therapy: WFL for tasks assessed/performed Overall Cognitive Status: Within Functional Limits for tasks assessed                                        General Comments      Exercises     Assessment/Plan    PT Assessment Patient needs continued PT services  PT Problem List Decreased activity tolerance;Decreased mobility;Decreased strength;Decreased knowledge of use of DME;Pain       PT Treatment Interventions DME instruction;Gait training;Stair training;Functional mobility training;Balance training;Therapeutic exercise;Therapeutic activities;Patient/family education    PT Goals (Current goals can be found in the Care Plan section)  Acute Rehab PT Goals Patient Stated Goal: home PT Goal Formulation: With patient Time For Goal Achievement: 06/20/17 Potential to Achieve Goals: Good    Frequency Min 3X/week   Barriers to discharge        Co-evaluation                AM-PAC PT "6 Clicks" Daily Activity  Outcome Measure Difficulty turning over in bed (including adjusting bedclothes, sheets and blankets)?: A Little Difficulty moving from lying on back to sitting on the side of the bed? : Unable Difficulty sitting down on and standing up from a chair with arms (e.g., wheelchair, bedside commode, etc,.)?: A Little Help needed moving to and from a bed to chair (including a wheelchair)?: A Little Help needed walking in hospital room?: A Little Help needed climbing 3-5 steps with a railing? : A Little 6 Click Score: 16    End of Session Equipment Utilized During Treatment: Gait belt Activity Tolerance: Patient tolerated treatment well Patient left: in chair;with family/visitor present;with call bell/phone within reach Nurse Communication: Mobility status;Other (comment) (Pt requesting meds for reflux.) PT Visit Diagnosis: Other abnormalities of gait and mobility (R26.89);Pain    Time: [id number] PT Time Calculation (min) (ACUTE ONLY): 26 min   Charges:   PT Evaluation $PT Eval Moderate Complexity: 1 Mod PT Treatments $Gait Training: 8-22 mins   PT G Codes:        Aida RaiderWendy Latrice Storlie, PT  Office # [phone] Pager [phone]   Ilda FoilGarrow, Zenaya Ulatowski Rene 06/06/2017, 12:30 PM

## 2017-06-06 NOTE — Progress Notes (Signed)
1 Day Post-Op   Subjective/Chief Complaint: Adequate pain control, occasional nausea   Objective: Vital signs in last 24 hours: Temp:  [97.6 F (36.4 C)-99.1 F (37.3 C)] 97.8 F (36.6 C) (08/30 0431) Pulse Rate:  [53-91] 60 (08/30 0431) Resp:  [11-20] 18 (08/30 0735) BP: (103-153)/(70-101) 127/74 (08/30 0431) SpO2:  [93 %-99 %] 95 % (08/30 0735) FiO2 (%):  [0 %] 0 % (08/29 2249) Weight:  [84.4 kg (186 lb)-87 kg (191 lb 11.2 oz)] 87 kg (191 lb 11.2 oz) (08/29 2055) Last BM Date: 06/04/17  Intake/Output from previous day: 08/29 0701 - 08/30 0700 In: 3951.3 [P.O.:360; I.V.:3131.3; IV Piggyback:60] Out: 1225 [Urine:1225] Intake/Output this shift: No intake/output data recorded.  General appearance: alert and cooperative Resp: clear to auscultation bilaterally Cardio: regular rate and rhythm GI: soft, dry stain on dressing, quiet  Lab Results:   Recent Labs  06/05/17 2329 06/06/17 0322  WBC 16.8* 16.8*  HGB 14.6 13.8  HCT 43.1 41.3  PLT 227 219   BMET  Recent Labs  06/05/17 2329 06/06/17 0322  NA  --  138  K  --  4.4  CL  --  107  CO2  --  25  GLUCOSE  --  132*  BUN  --  9  CREATININE 1.06 1.02  CALCIUM  --  8.6*   PT/INR No results for input(s): LABPROT, INR in the last 72 hours. ABG No results for input(s): PHART, HCO3 in the last 72 hours.  Invalid input(s): PCO2, PO2  Studies/Results: No results found.  Anti-infectives: Anti-infectives    Start     Dose/Rate Route Frequency Ordered Stop   06/05/17 1039  cefoTEtan in Dextrose 5% (CEFOTAN) IVPB 2 g     2 g Intravenous On call to O.R. 06/05/17 1039 06/05/17 1255      Assessment/Plan: s/p Procedure(s): SIGMOID COLECTOMY (N/A) POD#1 - await bowel function, Entereg Tobacco dependence - nicotine patch Post-op deconditioning - PT/OT VTE - Lovenox FEN - sips/chips   LOS: 1 day    Kiyara Bouffard E 06/06/2017

## 2017-06-06 NOTE — Care Management Note (Signed)
Case Management Note  Patient Details  Name: Eric Savage MRN: [id number] Date of Birth: Jan 27, 1966  Subjective/Objective:                    Action/Plan:   Expected Discharge Date:                  Expected Discharge Plan:  Home/Self Care  In-House Referral:     Discharge planning Services  CM Consult  Post Acute Care Choice:  Durable Medical Equipment Choice offered to:     DME Arranged:  Walker rolling DME Agency:  [facility]  [facility]     Status of Service:  In process, will continue to follow  If discussed at Long Length of Stay Meetings, dates discussed:    Additional Comments:  Eric Savage, Eric Mckeag Marie, RN 06/06/2017, 1:51 PM

## 2017-06-07 LAB — BASIC METABOLIC PANEL
Anion gap: 6 (ref 5–15)
BUN: 7 mg/dL (ref 6–20)
CO2: 28 mmol/L (ref 22–32)
Calcium: 8.8 mg/dL — ABNORMAL LOW (ref 8.9–10.3)
Chloride: 103 mmol/L (ref 101–111)
Creatinine, Ser: 1.01 mg/dL (ref 0.61–1.24)
GFR calc Af Amer: >60 mL/min (ref >60)
GFR calc non Af Amer: >60 mL/min (ref >60)
Glucose, Bld: 99 mg/dL (ref 65–99)
Potassium: 3.7 mmol/L (ref 3.5–5.1)
Sodium: 137 mmol/L (ref 135–145)

## 2017-06-07 LAB — CBC
HCT: 40.7 % (ref 39.0–52.0)
Hemoglobin: 13.3 g/dL (ref 13.0–17.0)
MCH: 30.1 pg (ref 26.0–34.0)
MCHC: 32.7 g/dL (ref 30.0–36.0)
MCV: 92.1 fL (ref 78.0–100.0)
Platelets: 200 10*3/uL (ref 150–400)
RBC: 4.42 MIL/uL (ref 4.22–5.81)
RDW: 12.5 % (ref 11.5–15.5)
WBC: 11.2 10*3/uL — ABNORMAL HIGH (ref 4.0–10.5)

## 2017-06-07 MED ORDER — HYDROMORPHONE HCL 1 MG/ML IJ SOLN
0.5000 mg | INTRAMUSCULAR | Status: DC | PRN
  Administered 2017-06-08: 1 mg via INTRAVENOUS
  Administered 2017-06-08: 0.5 mg via INTRAVENOUS
  Filled 2017-06-07 (×2): qty 1

## 2017-06-07 MED ORDER — PANTOPRAZOLE SODIUM 40 MG IV SOLR
40.0000 mg | Freq: Every day | INTRAVENOUS | Status: DC
  Administered 2017-06-07 – 2017-06-09 (×3): 40 mg via INTRAVENOUS
  Filled 2017-06-07 (×3): qty 40

## 2017-06-07 NOTE — Progress Notes (Signed)
2 Days Post-Op   Subjective/Chief Complaint: Up in chair, walked several laps, no flatus   Objective: Vital signs in last 24 hours: Temp:  [97.9 F (36.6 C)-99 F (37.2 C)] 97.9 F (36.6 C) (08/31 0644) Pulse Rate:  [58-63] 59 (08/31 0644) Resp:  [12-18] 18 (08/31 0837) BP: (109-135)/(64-79) 109/73 (08/31 0644) SpO2:  [94 %-99 %] 95 % (08/31 0837) Last BM Date: 06/04/17  Intake/Output from previous day: 08/30 0701 - 08/31 0700 In: 2977.9 [P.O.:30; I.V.:2947.9] Out: 1975 [Urine:1975] Intake/Output this shift: No intake/output data recorded.  General appearance: cooperative Resp: clear to auscultation bilaterally Cardio: regular rate and rhythm GI: soft, dressing dry stain, quiet  Lab Results:   Recent Labs  06/06/17 0322 06/07/17 0435  WBC 16.8* 11.2*  HGB 13.8 13.3  HCT 41.3 40.7  PLT 219 200   BMET  Recent Labs  06/06/17 0322 06/07/17 0435  NA 138 137  K 4.4 3.7  CL 107 103  CO2 25 28  GLUCOSE 132* 99  BUN 9 7  CREATININE 1.02 1.01  CALCIUM 8.6* 8.8*   PT/INR No results for input(s): LABPROT, INR in the last 72 hours. ABG No results for input(s): PHART, HCO3 in the last 72 hours.  Invalid input(s): PCO2, PO2  Studies/Results: No results found.  Anti-infectives: Anti-infectives    Start     Dose/Rate Route Frequency Ordered Stop   06/05/17 1039  cefoTEtan in Dextrose 5% (CEFOTAN) IVPB 2 g     2 g Intravenous On call to O.R. 06/05/17 1039 06/05/17 1255      Assessment/Plan: s/p Procedure(s): SIGMOID COLECTOMY (N/A) POD#2 - await bowel function, Entereg Tobacco dependence - nicotine patch, counseled to quit smoking Post-op deconditioning - PT/OT VTE - Lovenox FEN - sips/chips   LOS: 2 days    Eric Savage E 06/07/2017

## 2017-06-07 NOTE — Progress Notes (Signed)
Patient ID: Eric Savage, male   DOB: [birth year], 51 y.o.   MRN: [id number] Up in chair. Ambulated again. Wants to D/C PCA and try dilaudid PRN - ordered.  Violeta GelinasBurke Sereniti Wan, MD, MPH, FACS Trauma: [phone] General Surgery: [phone]

## 2017-06-07 NOTE — Progress Notes (Signed)
Per Pt bath has been done 

## 2017-06-07 NOTE — Progress Notes (Signed)
Physical Therapy Treatment Patient Details Name: Eric Savage MRN: [id number] DOB: Jan 01, 1966 Today's Date: 06/07/2017    History of Present Illness Pt is a 51 y.o. male s/p sigmoid colectomy due to recurrent diverticulitis.  Other PMH consists of OA, HTN, and CAD.    PT Comments    Pt remains guarded due to pain but at this time all goals are met and patient is working with mobility tech and family for continued ambulation.  Will inform supervising PT goals are met.  Plan next session with supervising PT for assessment of continued rehab in this setting.     Follow Up Recommendations  No PT follow up;Supervision - Intermittent     Equipment Recommendations  Rolling walker with 5" wheels    Recommendations for Other Services       Precautions / Restrictions Precautions Precautions: None Required Braces or Orthoses:  (abdominal binder) Other Brace/Splint: abdominal binder Restrictions Weight Bearing Restrictions: No    Mobility  Bed Mobility               General bed mobility comments: Pt standing outside room with family member on arrival.    Transfers Overall transfer level: Modified independent Equipment used: Rolling walker (2 wheeled) Transfers: Sit to/from Stand (stand to sit) Sit to Stand: Modified independent (Device/Increase time)         General transfer comment: increased time to complete, guarded due to pain.    Ambulation/Gait Ambulation/Gait assistance: Supervision Ambulation Distance (Feet): 750 Feet Assistive device: Rolling walker (2 wheeled) Gait Pattern/deviations: Step-through pattern;Trunk flexed Gait velocity: very slow   General Gait Details: VCs for posture and use of RW for safety.     Stairs Stairs: Yes   Stair Management: One rail Left Number of Stairs: 3 General stair comments: Cues for sequencing on stairs and safety with use of rail.  Supervision for safety.    Wheelchair Mobility    Modified Rankin (Stroke  Patients Only)       Balance Overall balance assessment: Needs assistance   Sitting balance-Leahy Scale: Normal       Standing balance-Leahy Scale: Good                              Cognition Arousal/Alertness: Awake/alert Behavior During Therapy: WFL for tasks assessed/performed Overall Cognitive Status: Within Functional Limits for tasks assessed                                        Exercises      General Comments        Pertinent Vitals/Pain Pain Assessment: Faces Pain Score: 6  Faces Pain Scale: Hurts little more Pain Location: abdomen Pain Descriptors / Indicators: Guarding;Grimacing;Sore Pain Intervention(s): Monitored during session;Repositioned (pt pressed PCA pump during session.  )    Home Living Family/patient expects to be discharged to:: Private residence Living Arrangements: Spouse/significant other Available Help at Discharge: Family;Available 24 hours/day Type of Home: House Home Access: Stairs to enter Entrance Stairs-Rails: Right;Left Home Layout: Able to live on main level with bedroom/bathroom Home Equipment: None Additional Comments: House is over 70 years old.    Prior Function Level of Independence: Independent      Comments: working, driving    PT Goals (current goals can now be found in the care plan section) Acute Rehab PT Goals Patient Stated Goal: home  Potential to Achieve Goals: Good Progress towards PT goals: Progressing toward goals    Frequency    Min 3X/week      PT Plan Current plan remains appropriate    Co-evaluation              AM-PAC PT "6 Clicks" Daily Activity  Outcome Measure  Difficulty turning over in bed (including adjusting bedclothes, sheets and blankets)?: None Difficulty moving from lying on back to sitting on the side of the bed? : A Lot Difficulty sitting down on and standing up from a chair with arms (e.g., wheelchair, bedside commode, etc,.)?: A  Little Help needed moving to and from a bed to chair (including a wheelchair)?: A Little Help needed walking in hospital room?: A Little Help needed climbing 3-5 steps with a railing? : A Little 6 Click Score: 18    End of Session   Activity Tolerance: Patient tolerated treatment well Patient left: in chair;with family/visitor present;with call bell/phone within reach Nurse Communication: Mobility status;Other (comment) PT Visit Diagnosis: Other abnormalities of gait and mobility (R26.89);Pain     Time: 1321-1354 PT Time Calculation (min) (ACUTE ONLY): 33 min  Charges:  $Gait Training: 8-22 mins $Therapeutic Activity: 8-22 mins                    G Codes:       Aimee Rogers, PTA pager [phone]    Aimee J Rogers 06/07/2017, 2:02 PM   

## 2017-06-07 NOTE — Evaluation (Signed)
Occupational Therapy Evaluation Patient Details Name: Eric Savage MRN: [id number] DOB: [birth year] Today's Date: 06/07/2017    History of Present Illness Pt is a 51 y.o. male s/p sigmoid colectomy due to recurrent diverticulitis.  Other PMH consists of OA, HTN, and CAD.   Clinical Impression   This 51 y/o M presents with the above. At baseline Pt is independent with ADLs and functional mobility, was driving and working. Pt currently requires MinGuard assist for functional mobility using RW, MaxA for LB ADLs secondary to abdominal pain.  Pt will return home with spouse who Pt reports is able to assist with ADL completion PRN. Will continue to follow acutely to maximize Pt's safety and independence with ADLs and functional mobility prior to return home.     Follow Up Recommendations  No OT follow up;Supervision - Intermittent    Equipment Recommendations  3 in 1 bedside commode           Precautions / Restrictions Precautions Precautions: None Required Braces or Orthoses: Other Brace/Splint Other Brace/Splint: abdominal binder Restrictions Weight Bearing Restrictions: No      Mobility Bed Mobility               General bed mobility comments: Pt up in recliner.  Transfers Overall transfer level: Needs assistance Equipment used: Rolling walker (2 wheeled) Transfers: Sit to/from Stand Sit to Stand: Min guard         General transfer comment: increased time to complete                                                ADL either performed or assessed with clinical judgement   ADL Overall ADL's : Needs assistance/impaired Eating/Feeding: Set up;Sitting   Grooming: Min guard;Standing   Upper Body Bathing: Min guard;Sitting   Lower Body Bathing: Moderate assistance;Sit to/from stand   Upper Body Dressing : Min guard;Sitting   Lower Body Dressing: Maximal assistance;Sit to/from stand   Toilet Transfer: Min guard;Ambulation;BSC;RW    Toileting- Clothing Manipulation and Hygiene: Minimal assistance;Sit to/from stand       Functional mobility during ADLs: Min guard;Rolling walker General ADL Comments: Pt limited in ability to complete ADLs due to pain in abdomen, reports spouse will be able to assist PRN with ADL completion. Educated on benefits of DME use and use of 3:1 over toilet and as shower chair in shower                          Pertinent Vitals/Pain Pain Assessment: Faces Faces Pain Scale: Hurts little more Pain Location: abdomen Pain Descriptors / Indicators: Guarding;Grimacing;Sore Pain Intervention(s): Repositioned;Limited activity within patient's tolerance;Monitored during session          Extremity/Trunk Assessment Upper Extremity Assessment Upper Extremity Assessment: Overall WFL for tasks assessed   Lower Extremity Assessment Lower Extremity Assessment: Defer to PT evaluation   Cervical / Trunk Assessment Cervical / Trunk Assessment: Normal   Communication Communication Communication: No difficulties   Cognition Arousal/Alertness: Awake/alert Behavior During Therapy: WFL for tasks assessed/performed Overall Cognitive Status: Within Functional Limits for tasks assessed  Home Living Family/patient expects to be discharged to:: Private residence Living Arrangements: Spouse/significant other Available Help at Discharge: Family;Available 24 hours/day Type of Home: House Home Access: Stairs to enter   Entrance Stairs-Rails: Right;Left Home Layout: Able to live on main level with bedroom/bathroom     Bathroom Shower/Tub: Producer, television/film/videoWalk-in shower   Bathroom Toilet: Standard     Home Equipment: None   Additional Comments: House is over 51 years old.      Prior Functioning/Environment Level of Independence: Independent        Comments: working, driving         OT Problem List: Impaired balance  (sitting and/or standing);Decreased activity tolerance;Decreased knowledge of use of DME or AE;Pain      OT Treatment/Interventions: Self-care/ADL training;DME and/or AE instruction;Therapeutic activities;Balance training;Therapeutic exercise;Energy conservation;Patient/family education    OT Goals(Current goals can be found in the care plan section) Acute Rehab OT Goals Patient Stated Goal: home OT Goal Formulation: With patient Time For Goal Achievement: 06/21/17 Potential to Achieve Goals: Good  OT Frequency: Min 2X/week                             AM-PAC PT "6 Clicks" Daily Activity     Outcome Measure Help from another person eating meals?: None Help from another person taking care of personal grooming?: A Little Help from another person toileting, which includes using toliet, bedpan, or urinal?: A Little Help from another person bathing (including washing, rinsing, drying)?: A Lot Help from another person to put on and taking off regular upper body clothing?: None Help from another person to put on and taking off regular lower body clothing?: A Lot 6 Click Score: 18   End of Session Equipment Utilized During Treatment: Gait belt;Rolling walker;Other (comment) (abdominal binder )  Activity Tolerance: Patient tolerated treatment well Patient left: in chair;with call bell/phone within reach  OT Visit Diagnosis: Pain;Unsteadiness on feet (R26.81) Pain - part of body:  (abdomen)                Time: [id number] OT Time Calculation (min): 19 min Charges:  OT General Charges $OT Visit: 1 Visit OT Evaluation $OT Eval Low Complexity: 1 Low G-Codes:     Eric Savage, OT Pager [phone] 06/07/2017   Eric Savage 06/07/2017, 12:58 PM

## 2017-06-07 NOTE — Anesthesia Postprocedure Evaluation (Signed)
Anesthesia Post Note  Patient: Eric Savage  Procedure(s) Performed: Procedure(s) (LRB): SIGMOID COLECTOMY (N/A)     Patient location during evaluation: PACU Anesthesia Type: General Level of consciousness: awake and alert Pain management: pain level controlled Vital Signs Assessment: post-procedure vital signs reviewed and stable Respiratory status: spontaneous breathing, nonlabored ventilation, respiratory function stable and patient connected to nasal cannula oxygen Cardiovascular status: blood pressure returned to baseline and stable Postop Assessment: no signs of nausea or vomiting Anesthetic complications: no    Last Vitals:  Vitals:   06/06/17 2035 06/07/17 0019  BP: 124/64   Pulse: 63   Resp: 16 14  Temp: 37.2 C   SpO2: 97% 96%                  [name]

## 2017-06-08 MED ORDER — HYDROMORPHONE HCL 1 MG/ML IJ SOLN
0.5000 mg | INTRAMUSCULAR | Status: DC | PRN
  Administered 2017-06-08 – 2017-06-09 (×3): 1 mg via INTRAVENOUS
  Filled 2017-06-08 (×3): qty 1

## 2017-06-08 NOTE — Progress Notes (Signed)
Urine output of 125cc, concentrated. Po intake not a lot due to abdominal distention .  IVF restarted but only at 50cc per patient'd request .

## 2017-06-08 NOTE — Progress Notes (Signed)
Patient still refusing IV fluids.  Wants to wait for now and see how he feels.  Will monitor intake and output.

## 2017-06-08 NOTE — Progress Notes (Signed)
3 Days Post-Op   Subjective/Chief Complaint: Walked this AM, stomach growling   Objective: Vital signs in last 24 hours: Temp:  [98.1 F (36.7 C)-98.9 F (37.2 C)] 98.5 F (36.9 C) (09/01 0512) Pulse Rate:  [64-81] 69 (09/01 0512) Resp:  [15-22] 20 (09/01 0512) BP: (104-139)/(66-82) 119/66 (09/01 0512) SpO2:  [94 %-100 %] 96 % (09/01 0512) Last BM Date: 06/04/17  Intake/Output from previous day: 08/31 0701 - 09/01 0700 In: 2230.3 [I.V.:2168.3; IV Piggyback:62] Out: 2275 [Urine:2275] Intake/Output this shift: Total I/O In: 962 [I.V.:900; IV Piggyback:62] Out: 2075 [Urine:2075]  General appearance: cooperative Resp: clear to auscultation bilaterally Cardio: regular rate and rhythm GI: soft, dressing same dry stain, +BS  Lab Results:   Recent Labs  06/06/17 0322 06/07/17 0435  WBC 16.8* 11.2*  HGB 13.8 13.3  HCT 41.3 40.7  PLT 219 200   BMET  Recent Labs  06/06/17 0322 06/07/17 0435  NA 138 137  K 4.4 3.7  CL 107 103  CO2 25 28  GLUCOSE 132* 99  BUN 9 7  CREATININE 1.02 1.01  CALCIUM 8.6* 8.8*   PT/INR No results for input(s): LABPROT, INR in the last 72 hours. ABG No results for input(s): PHART, HCO3 in the last 72 hours.  Invalid input(s): PCO2, PO2  Studies/Results: No results found.  Anti-infectives: Anti-infectives    Start     Dose/Rate Route Frequency Ordered Stop   06/05/17 1039  cefoTEtan in Dextrose 5% (CEFOTAN) IVPB 2 g     2 g Intravenous On call to O.R. 06/05/17 1039 06/05/17 1255      Assessment/Plan: s/p Procedure(s): SIGMOID COLECTOMY (N/A) POD#3 - await bowel function, Entereg Tobacco dependence - nicotine patch, counseled to quit smoking Post-op deconditioning - PT/OT VTE - Lovenox FEN - start clears  LOS: 3 days    Yul Diana E 06/08/2017

## 2017-06-09 MED ORDER — HYDROMORPHONE HCL 1 MG/ML IJ SOLN
1.0000 mg | INTRAMUSCULAR | Status: DC | PRN
  Administered 2017-06-09: 1 mg via INTRAVENOUS
  Administered 2017-06-09: 2 mg via INTRAVENOUS
  Administered 2017-06-09: 1 mg via INTRAVENOUS
  Administered 2017-06-09: 1.5 mg via INTRAVENOUS
  Filled 2017-06-09: qty 1
  Filled 2017-06-09 (×2): qty 2

## 2017-06-09 MED ORDER — HYDROMORPHONE HCL 1 MG/ML IJ SOLN
INTRAMUSCULAR | Status: AC
Start: 2017-06-09 — End: 2017-06-09
  Filled 2017-06-09: qty 1

## 2017-06-09 MED ORDER — PANTOPRAZOLE SODIUM 40 MG PO TBEC: 40.0000 mg | DELAYED_RELEASE_TABLET | Freq: Every day | ORAL | Status: DC

## 2017-06-09 MED ORDER — OXYCODONE HCL 5 MG PO TABS
5.0000 mg | ORAL_TABLET | ORAL | Status: DC | PRN
  Administered 2017-06-09 – 2017-06-10 (×5): 10 mg via ORAL
  Filled 2017-06-09 (×6): qty 2

## 2017-06-09 NOTE — Progress Notes (Signed)
4 Days Post-Op   Subjective/Chief Complaint: Up in chair, slept better, no flatus, tolerating clears   Objective: Vital signs in last 24 hours: Temp:  [97.3 F (36.3 C)-99.1 F (37.3 C)] 97.3 F (36.3 C) (09/02 0457) Pulse Rate:  [63-68] 66 (09/02 0457) Resp:  [16] 16 (09/02 0457) BP: (105-115)/(57-77) 105/57 (09/02 0457) SpO2:  [95 %-98 %] 95 % (09/02 0457) Last BM Date: 06/04/17  Intake/Output from previous day: 09/01 0701 - 09/02 0700 In: 662 [P.O.:342; I.V.:200; IV Piggyback:120] Out: 1375 [Urine:1375] Intake/Output this shift: Total I/O In: 160 [I.V.:100; IV Piggyback:60] Out: 950 [Urine:950]  General appearance: alert and cooperative GI: soft, dressing dry stain, +BS but distended  Lab Results:   Recent Labs  06/07/17 0435  WBC 11.2*  HGB 13.3  HCT 40.7  PLT 200   BMET  Recent Labs  06/07/17 0435  NA 137  K 3.7  CL 103  CO2 28  GLUCOSE 99  BUN 7  CREATININE 1.01  CALCIUM 8.8*   PT/INR No results for input(s): LABPROT, INR in the last 72 hours. ABG No results for input(s): PHART, HCO3 in the last 72 hours.  Invalid input(s): PCO2, PO2  Studies/Results: No results found.  Anti-infectives: Anti-infectives    Start     Dose/Rate Route Frequency Ordered Stop   06/05/17 1039  cefoTEtan in Dextrose 5% (CEFOTAN) IVPB 2 g     2 g Intravenous On call to O.R. 06/05/17 1039 06/05/17 1255      Assessment/Plan: s/p Procedure(s): SIGMOID COLECTOMY (N/A) POD#4 - await bowel function, Entereg, get larger binder Tobacco dependence - nicotine patch, counseled to quit smoking Post-op deconditioning - PT/OT VTE - Lovenox FEN - clears, IVF at 50/h but may SL if PO intake ok  LOS: 4 days    Yadira Hada E 06/09/2017

## 2017-06-09 NOTE — Progress Notes (Signed)
Patient tolerating PO's so per MD Janee Mornhompson note, will SL IV.

## 2017-06-09 NOTE — Progress Notes (Signed)
Patient requested to call oncall MD to be discharged.  MD Kinsinger notified and stated he would discharge him but didn't feel comfortable sending him home while taking high doses of pain medicine frequently.  Patient notified and agreed to stay and see Dr. Janee Mornhompson in the morning.

## 2017-06-10 LAB — BASIC METABOLIC PANEL
Anion gap: 6 (ref 5–15)
BUN: 9 mg/dL (ref 6–20)
CO2: 28 mmol/L (ref 22–32)
Calcium: 8.6 mg/dL — ABNORMAL LOW (ref 8.9–10.3)
Chloride: 103 mmol/L (ref 101–111)
Creatinine, Ser: 0.93 mg/dL (ref 0.61–1.24)
GFR calc Af Amer: >60 mL/min (ref >60)
GFR calc non Af Amer: >60 mL/min (ref >60)
Glucose, Bld: 95 mg/dL (ref 65–99)
Potassium: 4 mmol/L (ref 3.5–5.1)
Sodium: 137 mmol/L (ref 135–145)

## 2017-06-10 MED ORDER — OXYCODONE HCL 5 MG PO TABS: 10.0000 mg | ORAL_TABLET | ORAL | 0 refills | Status: DC | PRN

## 2017-06-10 MED ORDER — METHOCARBAMOL 750 MG PO TABS: 750.0000 mg | ORAL_TABLET | Freq: Four times a day (QID) | ORAL | 2 refills | Status: DC | PRN

## 2017-06-10 NOTE — Progress Notes (Signed)
Patient discharged to home with instructions and prescriptions. 

## 2017-06-10 NOTE — Discharge Summary (Signed)
Physician Discharge Summary  Patient ID: Eric Savage MRN: [id number] DOB/AGE: 1965-11-24 51 y.o.  Admit date: 06/05/2017 Discharge date: 06/10/2017  Admission Diagnoses:Chronic sigmoid diverticulitis  Discharge Diagnoses: Status post sigmoid colectomy Active Problems:   S/P colectomy   Discharged Condition: good  Surgeries: Procedure(s) (LRB): SIGMOID COLECTOMY (N/A)  Hospital Course: Underwent sigmoid colectomy which was uncomplicated. ERAS and Entereg protocols were used. He gradually regained bowel function and tolerated advancement of his diet. Pain control was good and he is discharged home in stable condition.  Consults: None  Significant Diagnostic Studies: labs: see EPIC  A&O Lungs CTA CV RRR Abd - dressing removed, wound clean dry and intact with staples, active bowel sounds   Disposition: 01-Home or Self Care  Discharge Instructions    Call MD for:  redness, tenderness, or signs of infection (pain, swelling, redness, odor or green/yellow discharge around incision site)    Complete by:  As directed    Diet - low sodium heart healthy    Complete by:  As directed    Discharge instructions    Complete by:  As directed    CCS      [facility], [place] [phone]  OPEN ABDOMINAL SURGERY: POST OP INSTRUCTIONS  Always review your discharge instruction sheet given to you by the facility where your surgery was performed.  IF YOU HAVE DISABILITY OR FAMILY LEAVE FORMS, YOU MUST BRING THEM TO THE OFFICE FOR PROCESSING.  PLEASE DO NOT GIVE THEM TO YOUR DOCTOR.  A prescription for pain medication may be given to you upon discharge.  Take your pain medication as prescribed, if needed.  If narcotic pain medicine is not needed, then you may take acetaminophen (Tylenol) or ibuprofen (Advil) as needed. Take your usually prescribed medications unless otherwise directed. If you need a refill on your pain medication, please contact your pharmacy. They will contact  our office to request authorization.  Prescriptions will not be filled after 5pm or on week-ends. You should follow a light diet the first few days after arrival home, such as soup and crackers, pudding, etc.unless your doctor has advised otherwise. A high-fiber, low fat diet can be resumed as tolerated.   Be sure to include lots of fluids daily. Most patients will experience some swelling and bruising on the chest and neck area.  Ice packs will help.  Swelling and bruising can take several days to resolve Most patients will experience some swelling and bruising in the area of the incision. Ice pack will help. Swelling and bruising can take several days to resolve..  It is common to experience some constipation if taking pain medication after surgery.  Increasing fluid intake and taking a stool softener will usually help or prevent this problem from occurring.  A mild laxative (Milk of Magnesia or Miralax) should be taken according to package directions if there are no bowel movements after 48 hours.  You may have steri-strips (small skin tapes) in place directly over the incision.  These strips should be left on the skin for 7-10 days.  If your surgeon used skin glue on the incision, you may shower in 24 hours.  The glue will flake off over the next 2-3 weeks.  Any sutures or staples will be removed at the office during your follow-up visit. You may find that a light gauze bandage over your incision may keep your staples from being rubbed or pulled. You may shower and replace the bandage daily. ACTIVITIES:  You may resume regular (light)  daily activities beginning the next day-such as daily self-care, walking, climbing stairs-gradually increasing activities as tolerated.  You may have sexual intercourse when it is comfortable.  Refrain from any heavy lifting or straining until approved by your doctor. You may drive when you no longer are taking prescription pain medication, you can comfortably wear a  seatbelt, and you can safely maneuver your car and apply brakes Return to Work: ___________________________________ Eric Savage should see your doctor in the office for a follow-up appointment approximately two weeks after your surgery.  Make sure that you call for this appointment within a day or two after you arrive home to insure a convenient appointment time. OTHER INSTRUCTIONS:  _____________________________________________________________ _____________________________________________________________  WHEN TO CALL YOUR DOCTOR: Fever over 101.0 Inability to urinate Nausea and/or vomiting Extreme swelling or bruising Continued bleeding from incision. Increased pain, redness, or drainage from the incision. Difficulty swallowing or breathing Muscle cramping or spasms. Numbness or tingling in hands or feet or around lips.  The clinic staff is available to answer your questions during regular business hours.  Please don't hesitate to call and ask to speak to one of the nurses if you have concerns.  For further questions, please visit [email or web address]   Increase activity slowly    Complete by:  As directed      Allergies as of 06/10/2017   No Known Allergies     Medication List    TAKE these medications   aspirin EC 81 MG tablet Take 81 mg by mouth at bedtime.   atorvastatin 40 MG tablet Commonly known as:  LIPITOR Take 1 tablet (40 mg total) by mouth daily. What changed:  when to take this   Esomeprazole Magnesium 20 MG Tbec Take 20 mg by mouth every evening.   lisinopril 20 MG tablet Commonly known as:  PRINIVIL,ZESTRIL Take 1 tablet (20 mg total) by mouth daily. What changed:  when to take this   methocarbamol 750 MG tablet Commonly known as:  ROBAXIN Take 1 tablet (750 mg total) by mouth 4 (four) times daily as needed (use for muscle cramps/pain).   oxyCODONE 5 MG immediate release tablet Commonly known as:  ROXICODONE Take 2 tablets (10 mg total) by mouth  every 4 (four) hours as needed for moderate pain or severe pain.            Durable Medical Equipment        Start     Ordered   06/06/17 1350  For home use only DME Walker rolling  Once    Question:  Patient needs a walker to treat with the following condition  Answer:  H/O colectomy   06/06/17 1350       Discharge Care Instructions        Start     Ordered   06/10/17 0000  Diet - low sodium heart healthy     06/10/17 0653   06/10/17 0000  Increase activity slowly     06/10/17 1610   06/10/17 0000  Discharge instructions    Comments:  [facility], [place] [phone]  OPEN ABDOMINAL SURGERY: POST OP INSTRUCTIONS  Always review your discharge instruction sheet given to you by the facility where your surgery was performed.  IF YOU HAVE DISABILITY OR FAMILY LEAVE FORMS, YOU MUST BRING THEM TO THE OFFICE FOR PROCESSING.  PLEASE DO NOT GIVE THEM TO YOUR DOCTOR.  A prescription for pain medication may be given to you upon discharge.  Take  your pain medication as prescribed, if needed.  If narcotic pain medicine is not needed, then you may take acetaminophen (Tylenol) or ibuprofen (Advil) as needed. Take your usually prescribed medications unless otherwise directed. If you need a refill on your pain medication, please contact your pharmacy. They will contact our office to request authorization.  Prescriptions will not be filled after 5pm or on week-ends. You should follow a light diet the first few days after arrival home, such as soup and crackers, pudding, etc.unless your doctor has advised otherwise. A high-fiber, low fat diet can be resumed as tolerated.   Be sure to include lots of fluids daily. Most patients will experience some swelling and bruising on the chest and neck area.  Ice packs will help.  Swelling and bruising can take several days to resolve Most patients will experience some swelling and bruising in the area of the incision. Ice pack will help.  Swelling and bruising can take several days to resolve..  It is common to experience some constipation if taking pain medication after surgery.  Increasing fluid intake and taking a stool softener will usually help or prevent this problem from occurring.  A mild laxative (Milk of Magnesia or Miralax) should be taken according to package directions if there are no bowel movements after 48 hours.  You may have steri-strips (small skin tapes) in place directly over the incision.  These strips should be left on the skin for 7-10 days.  If your surgeon used skin glue on the incision, you may shower in 24 hours.  The glue will flake off over the next 2-3 weeks.  Any sutures or staples will be removed at the office during your follow-up visit. You may find that a light gauze bandage over your incision may keep your staples from being rubbed or pulled. You may shower and replace the bandage daily. ACTIVITIES:  You may resume regular (light) daily activities beginning the next day-such as daily self-care, walking, climbing stairs-gradually increasing activities as tolerated.  You may have sexual intercourse when it is comfortable.  Refrain from any heavy lifting or straining until approved by your doctor. You may drive when you no longer are taking prescription pain medication, you can comfortably wear a seatbelt, and you can safely maneuver your car and apply brakes Return to Work: ___________________________________ Eric Savage should see your doctor in the office for a follow-up appointment approximately two weeks after your surgery.  Make sure that you call for this appointment within a day or two after you arrive home to insure a convenient appointment time. OTHER INSTRUCTIONS:  _____________________________________________________________ _____________________________________________________________  WHEN TO CALL YOUR DOCTOR: Fever over 101.0 Inability to urinate Nausea and/or vomiting Extreme swelling or  bruising Continued bleeding from incision. Increased pain, redness, or drainage from the incision. Difficulty swallowing or breathing Muscle cramping or spasms. Numbness or tingling in hands or feet or around lips.  The clinic staff is available to answer your questions during regular business hours.  Please don't hesitate to call and ask to speak to one of the nurses if you have concerns.  For further questions, please visit [email or web address]   06/10/17 0653   06/10/17 0000  Call MD for:  redness, tenderness, or signs of infection (pain, swelling, redness, odor or green/yellow discharge around incision site)     06/10/17 0653   06/10/17 0000  oxyCODONE (ROXICODONE) 5 MG immediate release tablet  Every 4 hours PRN     06/10/17 0653   06/10/17 0000  methocarbamol (  ROBAXIN) 750 MG tablet  4 times daily PRN     06/10/17 [id number]     Follow-up Information    Violeta Gelinashompson, Anayely Constantine, MD. Schedule an appointment as soon as possible for a visit.   Specialty:  General Surgery Why:  my office will call you tomorrow to make an appointment in 1 week Contact information: [address] [place] [address] [phone]           Signed: Violeta GelinasBurke Chadd Tollison, MD, MPH, FACS Pager: [phone]  06/10/2017, 8:48 AM

## 2017-12-05 ENCOUNTER — Other Ambulatory Visit: Payer: Self-pay | Admitting: *Deleted

## 2017-12-05 DIAGNOSIS — R109 Unspecified abdominal pain: Secondary | ICD-10-CM

## 2017-12-08 ENCOUNTER — Other Ambulatory Visit: Payer: Self-pay | Admitting: Cardiology

## 2017-12-19 ENCOUNTER — Ambulatory Visit
Admission: RE | Admit: 2017-12-19 | Discharge: 2017-12-19 | Disposition: A | Discharge status: Home / Self Care | Payer: 59 | Source: Ambulatory Visit | Attending: *Deleted | Admitting: *Deleted

## 2017-12-19 DIAGNOSIS — R109 Unspecified abdominal pain: Secondary | ICD-10-CM

## 2017-12-19 MED ORDER — IOPAMIDOL (ISOVUE-300) INJECTION 61%
100.0000 mL | Freq: Once | INTRAVENOUS | Status: AC | PRN
  Administered 2017-12-19: 100 mL via INTRAVENOUS

## 2018-01-07 ENCOUNTER — Other Ambulatory Visit: Payer: Self-pay | Admitting: Cardiology

## 2018-03-09 ENCOUNTER — Other Ambulatory Visit: Payer: Self-pay | Admitting: Cardiology

## 2018-06-03 ENCOUNTER — Other Ambulatory Visit: Payer: Self-pay | Admitting: Cardiology

## 2018-06-03 NOTE — Telephone Encounter (Signed)
Rx sent to pharmacy   

## 2018-06-13 ENCOUNTER — Telehealth: Payer: Self-pay | Admitting: *Deleted

## 2018-06-13 NOTE — Telephone Encounter (Signed)
REFERRAL SENT TO SCHEDULING, NOTES ON FILE FROM DR. Charna Elizabeth [phone].

## 2018-06-17 ENCOUNTER — Other Ambulatory Visit: Payer: Self-pay | Admitting: Gastroenterology

## 2018-06-17 DIAGNOSIS — K8020 Calculus of gallbladder without cholecystitis without obstruction: Secondary | ICD-10-CM

## 2018-06-20 ENCOUNTER — Ambulatory Visit
Admission: RE | Admit: 2018-06-20 | Discharge: 2018-06-20 | Disposition: A | Discharge status: Home / Self Care | Payer: 59 | Source: Ambulatory Visit | Attending: Gastroenterology | Admitting: Gastroenterology

## 2018-07-18 ENCOUNTER — Ambulatory Visit: Payer: Self-pay | Admitting: General Surgery

## 2018-07-25 NOTE — Pre-Procedure Instructions (Signed)
Eric Savage  07/25/2018      [id number] [place], [place] - [address] [place] [address] Phone: [phone] Fax: [phone]    Your procedure is scheduled on October 28.  Report to [facility] Admitting at 11:00 A.M.  Call this number if you have problems the morning of surgery:  [phone]   Remember:  Do not eat or drink after midnight.    Take these medicines the morning of surgery with A SIP OF WATER  omeprazole (PRILOSEC) 40 MG capsule    Do not wear jewelry, make-up or nail polish.  Do not wear lotions, powders, or perfumes, or deodorant.  Do not shave 48 hours prior to surgery.  Men may shave face and neck.  Do not bring valuables to the hospital.  [facility] is not responsible for any belongings or valuables.  Contacts, dentures or bridgework may not be worn into surgery.  Leave your suitcase in the car.  After surgery it may be brought to your room.  For patients admitted to the hospital, discharge time will be determined by your treatment team.  Patients discharged the day of surgery will not be allowed to drive home.   [place]- Preparing For Surgery  Before surgery, you can play an important role. Because skin is not sterile, your skin needs to be as free of germs as possible. You can reduce the number of germs on your skin by washing with CHG (chlorahexidine gluconate) Soap before surgery.  CHG is an antiseptic cleaner which kills germs and bonds with the skin to continue killing germs even after washing.    Oral Hygiene is also important to reduce your risk of infection.  Remember - BRUSH YOUR TEETH THE MORNING OF SURGERY WITH YOUR REGULAR TOOTHPASTE  Please do not use if you have an allergy to CHG or antibacterial soaps. If your skin becomes reddened/irritated stop using the CHG.  Do not shave (including legs and underarms) for at least 48 hours prior to first CHG  shower. It is OK to shave your face.  Please follow these instructions carefully.   1. Shower the NIGHT BEFORE SURGERY and the MORNING OF SURGERY with CHG.   2. If you chose to wash your hair, wash your hair first as usual with your normal shampoo.  3. After you shampoo, rinse your hair and body thoroughly to remove the shampoo.  4. Use CHG as you would any other liquid soap. You can apply CHG directly to the skin and wash gently with a scrungie or a clean washcloth.   5. Apply the CHG Soap to your body ONLY FROM THE NECK DOWN.  Do not use on open wounds or open sores. Avoid contact with your eyes, ears, mouth and genitals (private parts). Wash Face and genitals (private parts)  with your normal soap.  6. Wash thoroughly, paying special attention to the area where your surgery will be performed.  7. Thoroughly rinse your body with warm water from the neck down.  8. DO NOT shower/wash with your normal soap after using and rinsing off the CHG Soap.  9. Pat yourself dry with a CLEAN TOWEL.  10. Wear CLEAN PAJAMAS to bed the night before surgery, wear comfortable clothes the morning of surgery  11. Place CLEAN SHEETS on your bed the night of your first shower and DO NOT SLEEP WITH PETS.    Day of Surgery:  Shower as stated above. Do not apply any deodorants/lotions.  Please wear clean clothes to the hospital/surgery center.   Remember to brush your teeth WITH YOUR REGULAR TOOTHPASTE.    Please read over the following fact sheets that you were given.

## 2018-07-28 ENCOUNTER — Other Ambulatory Visit: Payer: Self-pay

## 2018-07-28 ENCOUNTER — Encounter (HOSPITAL_COMMUNITY): Payer: Self-pay

## 2018-07-28 ENCOUNTER — Encounter (HOSPITAL_COMMUNITY)
Admission: RE | Admit: 2018-07-28 | Discharge: 2018-07-28 | Disposition: A | Discharge status: Home / Self Care | Payer: 59 | Source: Ambulatory Visit | Attending: General Surgery | Admitting: General Surgery

## 2018-07-28 DIAGNOSIS — I2510 Atherosclerotic heart disease of native coronary artery without angina pectoris: Secondary | ICD-10-CM | POA: Diagnosis not present

## 2018-07-28 DIAGNOSIS — E785 Hyperlipidemia, unspecified: Secondary | ICD-10-CM | POA: Diagnosis not present

## 2018-07-28 DIAGNOSIS — K8020 Calculus of gallbladder without cholecystitis without obstruction: Secondary | ICD-10-CM | POA: Diagnosis not present

## 2018-07-28 DIAGNOSIS — Z01812 Encounter for preprocedural laboratory examination: Secondary | ICD-10-CM | POA: Insufficient documentation

## 2018-07-28 DIAGNOSIS — K219 Gastro-esophageal reflux disease without esophagitis: Secondary | ICD-10-CM | POA: Diagnosis not present

## 2018-07-28 DIAGNOSIS — Z79899 Other long term (current) drug therapy: Secondary | ICD-10-CM | POA: Insufficient documentation

## 2018-07-28 DIAGNOSIS — F17210 Nicotine dependence, cigarettes, uncomplicated: Secondary | ICD-10-CM | POA: Diagnosis not present

## 2018-07-28 DIAGNOSIS — Z7982 Long term (current) use of aspirin: Secondary | ICD-10-CM | POA: Insufficient documentation

## 2018-07-28 DIAGNOSIS — I10 Essential (primary) hypertension: Secondary | ICD-10-CM | POA: Insufficient documentation

## 2018-07-28 HISTORY — DX: Anxiety disorder, unspecified: F41.9

## 2018-07-28 LAB — CBC
HCT: 44.8 % (ref 39.0–52.0)
Hemoglobin: 14.7 g/dL (ref 13.0–17.0)
MCH: 30.5 pg (ref 26.0–34.0)
MCHC: 32.8 g/dL (ref 30.0–36.0)
MCV: 92.9 fL (ref 80.0–100.0)
Platelets: 226 10*3/uL (ref 150–400)
RBC: 4.82 MIL/uL (ref 4.22–5.81)
RDW: 11.7 % (ref 11.5–15.5)
WBC: 10.6 10*3/uL — ABNORMAL HIGH (ref 4.0–10.5)
nRBC: 0 % (ref 0.0–0.2)

## 2018-07-28 LAB — COMPREHENSIVE METABOLIC PANEL
ALT: 20 U/L (ref 0–44)
AST: 15 U/L (ref 15–41)
Albumin: 4 g/dL (ref 3.5–5.0)
Alkaline Phosphatase: 68 U/L (ref 38–126)
Anion gap: 8 (ref 5–15)
BUN: 11 mg/dL (ref 6–20)
CO2: 23 mmol/L (ref 22–32)
Calcium: 8.9 mg/dL (ref 8.9–10.3)
Chloride: 107 mmol/L (ref 98–111)
Creatinine, Ser: 0.98 mg/dL (ref 0.61–1.24)
GFR calc Af Amer: >60 mL/min (ref >60)
GFR calc non Af Amer: >60 mL/min (ref >60)
Glucose, Bld: 87 mg/dL (ref 70–99)
Potassium: 4.1 mmol/L (ref 3.5–5.1)
Sodium: 138 mmol/L (ref 135–145)
Total Bilirubin: 0.7 mg/dL (ref 0.3–1.2)
Total Protein: 6.2 g/dL — ABNORMAL LOW (ref 6.5–8.1)

## 2018-07-28 NOTE — Progress Notes (Signed)
PCP - Marva Panda NP Cardiologist - Dr. Herbie Baltimore  EKG - requested Stress Test - 2010 ECHO - 2010 Cardiac Cath - 2014    Blood Thinner Instructions: N/A  Aspirin Instructions: take up until DOS.  Anesthesia review: Yes, EKG requested. Cardiac hx  Patient denies shortness of breath, fever, cough and chest pain at PAT appointment   Patient verbalized understanding of instructions that were given to them at the PAT appointment. Patient was also instructed that they will need to review over the PAT instructions again at home before surgery.

## 2018-07-29 NOTE — Progress Notes (Signed)
Anesthesia Chart Review:  Case:  [id number] Date/Time:  08/04/18 1245   Procedure:  LAPAROSCOPIC CHOLECYSTECTOMY WITH INTRAOPERATIVE CHOLANGIOGRAM ERAS PATHWAY (N/A )   Anesthesia type:  General   Pre-op diagnosis:  Symptomatic cholelithiasis   Location:  [facility] ROOM 02 / [facility] OR   Surgeon:  Violeta Gelinas, MD      DISCUSSION: Patient is a CAD (s/p DES to RCA and CX 2010), HTN, hyperlipidemia, recurrent diverticulitis (s/p sigmoid colectomy 06/05/17), GERD. Current smoker. BMI 31 - Urgent care visit 06/12/18 for belching, diarrhea, chest pain that radiates to his back. Known history of CAD and gall stones. Also smokes ~ 1/2 PPD and drinks "one pint of liquor each week" x20 years. Provider felt chest pain was atypical. Advised discontinue ETOH and ollow-up with GI and cardiology. LFTs, amylase and lipase WNL.   He denied chest pain, SOB, fever, cough at PAT.  EKG is still pending from [facility]. Reviewed available information with anesthesiologist Adonis Huguenin, MD. If no acute changes and EKG stable then it is anticipated that he can proceed as planned.    VS: BP 135/71   Pulse (!) 57   Temp 36.7 C   Resp 20   Ht 5\' 5"  (1.651 m)   Wt 85.2 kg   SpO2 97%   BMI 31.27 kg/m   PROVIDERS: Marva Panda, NP is PCP [name], MD is cardiologist. Last visit 12/06/16 with annual follow-up recommended. He has not been on a b-blocker due to fatigue and bradycardia. Charna Elizabeth, MD is GI   LABS: Labs reviewed: Acceptable for surgery. (all labs ordered are listed, but only abnormal results are displayed)  Labs Reviewed  CBC - Abnormal; Notable for the following components:      Result Value   WBC 10.6 (*)    All other components within normal limits  COMPREHENSIVE METABOLIC PANEL - Abnormal; Notable for the following components:   Total Protein 6.2 (*)    All other components within normal limits     IMAGES: CT abdomen/pelvis 12/19/17: IMPRESSION: 1. Potential small  focus of diverticulitis just proximal to the sigmoid anastomosis. There is mild thickening of the colon in this region and mild pericolonic inflammation. 2. Probable benign cyst of the LEFT kidney. 3. Gallstone without evidence of cholecystitis. 4. Benign hepatic hemangiomas.   EKG: Last EKG seen is from 12/06/16 and showed NSR. PAT RN requested 06/12/18 EKG from [facility]. If a more recent EKG not received then he will need an EKG on the day of surgery.    CV: Cardiac cath 01/28/13:  Results: 1. Left main: normal 2. LAD: 20% proximal smooth narrowing; 60 - 70% smooth mid diagonal 2 narrowing 3. Left circumflex: normal with patent stent  4. Right coronary artery: normal with patent stent 5. Left ventriculography: The overall LVEF estimated 60-65% without wall motion abnormalities. IMPRESSION: No evidence for restenosis of either RCA or LCX. Mild LAD 20% narrowing and 60 - 70% smooth mid diagonal 2 stenosis. Plan: medical therapy, smoking cessation, lipid therapy.  Echo 05/13/09:  Summary: 1. LV systolic function normal. EF >55%. Normal LV wall thickness. 2. Mild mitral regurgitation. 3. Trace tricuspid regurgitation.   Past Medical History:  Diagnosis Date  . Acid reflux   . Anxiety   . Arthritis   . CAD S/P percutaneous coronary angioplasty 12/10   RCA/CFX DES  . Chest pain 01/2013   Evaluate cardiac catheterization, no obstructive CAD  . Diverticulitis   . Dysrhythmia  OCC PALPITATION   . High cholesterol   . History of kidney stones   . Hypertension     Past Surgical History:  Procedure Laterality Date  . COLONOSCOPY    . CORONARY ANGIOPLASTY WITH STENT PLACEMENT  09/2009   Proximal RCA - Cypher DES 3.0 mm but 33 mm postdilated to 3.7 mm; proximal CFX XIENCE V. DES 2.75 mm x 15 mm postdilated to 3.0 mm  . LAPAROSCOPIC SIGMOID COLECTOMY N/A 06/05/2017   Procedure: SIGMOID COLECTOMY;  Surgeon: Violeta Gelinas, MD;  Location: [facility] OR;  Service: General;   Laterality: N/A;  . LEFT HEART CATHETERIZATION WITH CORONARY ANGIOGRAM N/A 05/19/2012   Procedure: LEFT HEART CATHETERIZATION WITH CORONARY ANGIOGRAM;  Surgeon: Marykay Lex, MD;  Location: [facility] CATH LAB;  Service: Cardiovascular;  Laterality: N/A;  . LEFT HEART CATHETERIZATION WITH CORONARY ANGIOGRAM N/A 01/28/2013   Procedure: LEFT HEART CATHETERIZATION WITH CORONARY ANGIOGRAM;  Surgeon: Lennette Bihari, MD;  Location: [facility] CATH LAB;  Service: Cardiovascular:  No evidence for restenosis of either RCA or LCX. Mild LAD 20% narrowing and 60 - 70% smooth mid diagonal 2 stenosis.  Marland Kitchen NM MYOCAR PERF WALL MOTION  01/04/2011   Protocol:Bruce, post stress EF=69%, Exercise Cap 13 METS, attenuation defect in inferior region of myocardium, no sig. ischemia demonstrated  . TONSILLECTOMY  ~ 1973  . TRANSTHORACIC ECHOCARDIOGRAM  05/13/2009   EF =>55%, mild mitral regurg, trace tricuspid regurg.    MEDICATIONS: . ALPRAZolam (XANAX) 0.5 MG tablet  . aspirin EC 81 MG tablet  . atorvastatin (LIPITOR) 40 MG tablet  . lisinopril (PRINIVIL,ZESTRIL) 20 MG tablet  . omeprazole (PRILOSEC) 40 MG capsule   No current facility-administered medications for this encounter.   He reported instructions to take ASA up until the day of surgery.

## 2018-08-04 ENCOUNTER — Encounter (HOSPITAL_COMMUNITY)
Admission: RE | Disposition: A | Discharge status: Home / Self Care | Payer: Self-pay | Source: Ambulatory Visit | Attending: General Surgery

## 2018-08-04 ENCOUNTER — Ambulatory Visit (HOSPITAL_COMMUNITY)
Admission: RE | Admit: 2018-08-04 | Discharge: 2018-08-04 | Disposition: A | Discharge status: Home / Self Care | Payer: 59 | Source: Ambulatory Visit | Attending: General Surgery | Admitting: General Surgery

## 2018-08-04 ENCOUNTER — Ambulatory Visit (HOSPITAL_COMMUNITY): Payer: 59 | Admitting: Vascular Surgery

## 2018-08-04 ENCOUNTER — Ambulatory Visit (HOSPITAL_COMMUNITY): Payer: 59 | Admitting: Anesthesiology

## 2018-08-04 ENCOUNTER — Encounter (HOSPITAL_COMMUNITY): Payer: Self-pay | Admitting: *Deleted

## 2018-08-04 DIAGNOSIS — I10 Essential (primary) hypertension: Secondary | ICD-10-CM | POA: Insufficient documentation

## 2018-08-04 DIAGNOSIS — Z79899 Other long term (current) drug therapy: Secondary | ICD-10-CM | POA: Diagnosis not present

## 2018-08-04 DIAGNOSIS — F419 Anxiety disorder, unspecified: Secondary | ICD-10-CM | POA: Diagnosis not present

## 2018-08-04 DIAGNOSIS — F17210 Nicotine dependence, cigarettes, uncomplicated: Secondary | ICD-10-CM | POA: Insufficient documentation

## 2018-08-04 DIAGNOSIS — Z419 Encounter for procedure for purposes other than remedying health state, unspecified: Secondary | ICD-10-CM

## 2018-08-04 DIAGNOSIS — E7800 Pure hypercholesterolemia, unspecified: Secondary | ICD-10-CM | POA: Diagnosis not present

## 2018-08-04 DIAGNOSIS — I2510 Atherosclerotic heart disease of native coronary artery without angina pectoris: Secondary | ICD-10-CM | POA: Insufficient documentation

## 2018-08-04 DIAGNOSIS — K811 Chronic cholecystitis: Secondary | ICD-10-CM | POA: Insufficient documentation

## 2018-08-04 DIAGNOSIS — Z955 Presence of coronary angioplasty implant and graft: Secondary | ICD-10-CM | POA: Diagnosis not present

## 2018-08-04 DIAGNOSIS — K219 Gastro-esophageal reflux disease without esophagitis: Secondary | ICD-10-CM | POA: Diagnosis not present

## 2018-08-04 DIAGNOSIS — Z7982 Long term (current) use of aspirin: Secondary | ICD-10-CM | POA: Diagnosis not present

## 2018-08-04 DIAGNOSIS — K8020 Calculus of gallbladder without cholecystitis without obstruction: Secondary | ICD-10-CM | POA: Diagnosis present

## 2018-08-04 HISTORY — PX: CHOLECYSTECTOMY: SHX55

## 2018-08-04 SURGERY — LAPAROSCOPIC CHOLECYSTECTOMY WITH INTRAOPERATIVE CHOLANGIOGRAM
Anesthesia: General

## 2018-08-04 MED ORDER — LIDOCAINE 2% (20 MG/ML) 5 ML SYRINGE
INTRAMUSCULAR | Status: AC
  Filled 2018-08-04: qty 10

## 2018-08-04 MED ORDER — LACTATED RINGERS IV SOLN
INTRAVENOUS | Status: DC
  Administered 2018-08-04 (×3): via INTRAVENOUS

## 2018-08-04 MED ORDER — KETOROLAC TROMETHAMINE 30 MG/ML IJ SOLN: 30.0000 mg | Freq: Once | INTRAMUSCULAR | Status: DC | PRN

## 2018-08-04 MED ORDER — CHLORHEXIDINE GLUCONATE CLOTH 2 % EX PADS: 6.0000 | MEDICATED_PAD | Freq: Once | CUTANEOUS | Status: DC

## 2018-08-04 MED ORDER — FENTANYL CITRATE (PF) 250 MCG/5ML IJ SOLN
INTRAMUSCULAR | Status: AC
  Filled 2018-08-04: qty 5

## 2018-08-04 MED ORDER — CELECOXIB 200 MG PO CAPS
200.0000 mg | ORAL_CAPSULE | ORAL | Status: AC
  Administered 2018-08-04: 200 mg via ORAL

## 2018-08-04 MED ORDER — PROPOFOL 10 MG/ML IV BOLUS
INTRAVENOUS | Status: DC | PRN
  Administered 2018-08-04: 200 mg via INTRAVENOUS

## 2018-08-04 MED ORDER — ACETAMINOPHEN 325 MG PO TABS: 325.0000 mg | ORAL_TABLET | ORAL | Status: DC | PRN

## 2018-08-04 MED ORDER — MIDAZOLAM HCL 5 MG/5ML IJ SOLN
INTRAMUSCULAR | Status: DC | PRN
  Administered 2018-08-04: 2 mg via INTRAVENOUS

## 2018-08-04 MED ORDER — GABAPENTIN 300 MG PO CAPS
ORAL_CAPSULE | ORAL | Status: AC
  Administered 2018-08-04: 300 mg via ORAL
  Filled 2018-08-04: qty 1

## 2018-08-04 MED ORDER — ONDANSETRON HCL 4 MG/2ML IJ SOLN: 4.0000 mg | Freq: Once | INTRAMUSCULAR | Status: DC | PRN

## 2018-08-04 MED ORDER — CELECOXIB 200 MG PO CAPS
ORAL_CAPSULE | ORAL | Status: AC
  Administered 2018-08-04: 200 mg via ORAL
  Filled 2018-08-04: qty 1

## 2018-08-04 MED ORDER — OXYCODONE HCL 5 MG PO TABS
5.0000 mg | ORAL_TABLET | Freq: Once | ORAL | Status: AC | PRN
  Administered 2018-08-04: 5 mg via ORAL

## 2018-08-04 MED ORDER — DEXAMETHASONE SODIUM PHOSPHATE 10 MG/ML IJ SOLN
INTRAMUSCULAR | Status: AC
  Filled 2018-08-04: qty 1

## 2018-08-04 MED ORDER — PROPOFOL 10 MG/ML IV BOLUS
INTRAVENOUS | Status: AC
  Filled 2018-08-04: qty 20

## 2018-08-04 MED ORDER — ACETAMINOPHEN 500 MG PO TABS
1000.0000 mg | ORAL_TABLET | ORAL | Status: AC
  Administered 2018-08-04: 1000 mg via ORAL

## 2018-08-04 MED ORDER — IOPAMIDOL (ISOVUE-300) INJECTION 61%
INTRAVENOUS | Status: AC
  Filled 2018-08-04: qty 50

## 2018-08-04 MED ORDER — ONDANSETRON HCL 4 MG/2ML IJ SOLN
INTRAMUSCULAR | Status: AC
  Filled 2018-08-04: qty 2

## 2018-08-04 MED ORDER — 0.9 % SODIUM CHLORIDE (POUR BTL) OPTIME
TOPICAL | Status: DC | PRN
  Administered 2018-08-04: 1000 mL

## 2018-08-04 MED ORDER — FENTANYL CITRATE (PF) 100 MCG/2ML IJ SOLN
INTRAMUSCULAR | Status: DC | PRN
  Administered 2018-08-04 (×3): 50 ug via INTRAVENOUS
  Administered 2018-08-04: 100 ug via INTRAVENOUS

## 2018-08-04 MED ORDER — ACETAMINOPHEN 500 MG PO TABS
ORAL_TABLET | ORAL | Status: AC
  Administered 2018-08-04: 1000 mg via ORAL
  Filled 2018-08-04: qty 2

## 2018-08-04 MED ORDER — ROCURONIUM BROMIDE 100 MG/10ML IV SOLN
INTRAVENOUS | Status: DC | PRN
  Administered 2018-08-04: 50 mg via INTRAVENOUS

## 2018-08-04 MED ORDER — PHENYLEPHRINE 40 MCG/ML (10ML) SYRINGE FOR IV PUSH (FOR BLOOD PRESSURE SUPPORT)
PREFILLED_SYRINGE | INTRAVENOUS | Status: AC
  Filled 2018-08-04: qty 30

## 2018-08-04 MED ORDER — BUPIVACAINE-EPINEPHRINE 0.25% -1:200000 IJ SOLN
INTRAMUSCULAR | Status: DC | PRN
  Administered 2018-08-04: 16 mL

## 2018-08-04 MED ORDER — OXYCODONE HCL 5 MG PO TABS
ORAL_TABLET | ORAL | Status: AC
  Filled 2018-08-04: qty 1

## 2018-08-04 MED ORDER — OXYCODONE HCL 5 MG/5ML PO SOLN: 5.0000 mg | Freq: Once | ORAL | Status: AC | PRN

## 2018-08-04 MED ORDER — LIDOCAINE HCL (CARDIAC) PF 100 MG/5ML IV SOSY
PREFILLED_SYRINGE | INTRAVENOUS | Status: DC | PRN
  Administered 2018-08-04: 100 mg via INTRAVENOUS

## 2018-08-04 MED ORDER — PHENYLEPHRINE HCL 10 MG/ML IJ SOLN
INTRAMUSCULAR | Status: DC | PRN
  Administered 2018-08-04 (×2): 80 ug via INTRAVENOUS

## 2018-08-04 MED ORDER — GABAPENTIN 300 MG PO CAPS
300.0000 mg | ORAL_CAPSULE | ORAL | Status: AC
  Administered 2018-08-04: 300 mg via ORAL

## 2018-08-04 MED ORDER — CEFAZOLIN SODIUM-DEXTROSE 2-4 GM/100ML-% IV SOLN
2.0000 g | INTRAVENOUS | Status: AC
  Administered 2018-08-04: 2 g via INTRAVENOUS

## 2018-08-04 MED ORDER — SUGAMMADEX SODIUM 200 MG/2ML IV SOLN
INTRAVENOUS | Status: DC | PRN
  Administered 2018-08-04: 200 mg via INTRAVENOUS

## 2018-08-04 MED ORDER — ONDANSETRON HCL 4 MG/2ML IJ SOLN
INTRAMUSCULAR | Status: DC | PRN
  Administered 2018-08-04: 5 mg via INTRAVENOUS

## 2018-08-04 MED ORDER — MIDAZOLAM HCL 2 MG/2ML IJ SOLN
INTRAMUSCULAR | Status: AC
  Filled 2018-08-04: qty 2

## 2018-08-04 MED ORDER — ACETAMINOPHEN 160 MG/5ML PO SOLN: 325.0000 mg | ORAL | Status: DC | PRN

## 2018-08-04 MED ORDER — FENTANYL CITRATE (PF) 100 MCG/2ML IJ SOLN: 25.0000 ug | INTRAMUSCULAR | Status: DC | PRN

## 2018-08-04 MED ORDER — BUPIVACAINE-EPINEPHRINE (PF) 0.25% -1:200000 IJ SOLN
INTRAMUSCULAR | Status: AC
  Filled 2018-08-04: qty 30

## 2018-08-04 MED ORDER — OXYCODONE HCL 5 MG PO TABS: 5.0000 mg | ORAL_TABLET | Freq: Four times a day (QID) | ORAL | 0 refills | Status: DC | PRN

## 2018-08-04 MED ORDER — CEFAZOLIN SODIUM-DEXTROSE 2-4 GM/100ML-% IV SOLN
INTRAVENOUS | Status: AC
  Filled 2018-08-04: qty 100

## 2018-08-04 MED ORDER — SODIUM CHLORIDE 0.9 % IR SOLN
Status: DC | PRN
  Administered 2018-08-04: 1000 mL

## 2018-08-04 MED ORDER — MEPERIDINE HCL 50 MG/ML IJ SOLN: 6.2500 mg | INTRAMUSCULAR | Status: DC | PRN

## 2018-08-04 MED ORDER — EPHEDRINE 5 MG/ML INJ
INTRAVENOUS | Status: AC
  Filled 2018-08-04: qty 10

## 2018-08-04 MED ORDER — DEXAMETHASONE SODIUM PHOSPHATE 10 MG/ML IJ SOLN
INTRAMUSCULAR | Status: DC | PRN
  Administered 2018-08-04: 5 mg via INTRAVENOUS

## 2018-08-04 SURGICAL SUPPLY — 51 items
ADH SKN CLS APL DERMABOND .7 (GAUZE/BANDAGES/DRESSINGS) ×1
APPLIER CLIP 5 13 M/L LIGAMAX5 (MISCELLANEOUS) ×2
APR CLP MED LRG 5 ANG JAW (MISCELLANEOUS) ×1
BAG SPEC RTRVL 10 TROC 200 (ENDOMECHANICALS) ×1
BLADE CLIPPER SURG (BLADE) IMPLANT
CANISTER SUCT 3000ML PPV (MISCELLANEOUS) ×2 IMPLANT
CHLORAPREP W/TINT 26ML (MISCELLANEOUS) ×2 IMPLANT
CLIP APPLIE 5 13 M/L LIGAMAX5 (MISCELLANEOUS) ×1 IMPLANT
COVER MAYO STAND STRL (DRAPES) ×1 IMPLANT
COVER SURGICAL LIGHT HANDLE (MISCELLANEOUS) ×2 IMPLANT
COVER WAND RF STERILE (DRAPES) ×2 IMPLANT
DERMABOND ADVANCED (GAUZE/BANDAGES/DRESSINGS) ×1
DERMABOND ADVANCED .7 DNX12 (GAUZE/BANDAGES/DRESSINGS) ×1 IMPLANT
DRAPE C-ARM 42X72 X-RAY (DRAPES) ×1 IMPLANT
ELECT REM PT RETURN 9FT ADLT (ELECTROSURGICAL) ×2
ELECTRODE REM PT RTRN 9FT ADLT (ELECTROSURGICAL) ×1 IMPLANT
FILTER SMOKE EVAC LAPAROSHD (FILTER) IMPLANT
GLOVE BIO SURGEON STRL SZ8 (GLOVE) ×2 IMPLANT
GLOVE BIOGEL PI IND STRL 6.5 (GLOVE) IMPLANT
GLOVE BIOGEL PI IND STRL 7.0 (GLOVE) IMPLANT
GLOVE BIOGEL PI IND STRL 8 (GLOVE) ×1 IMPLANT
GLOVE BIOGEL PI INDICATOR 6.5 (GLOVE) ×2
GLOVE BIOGEL PI INDICATOR 7.0 (GLOVE) ×2
GLOVE BIOGEL PI INDICATOR 8 (GLOVE) ×3
GOWN STRL REUS W/ TWL LRG LVL3 (GOWN DISPOSABLE) ×2 IMPLANT
GOWN STRL REUS W/ TWL XL LVL3 (GOWN DISPOSABLE) ×1 IMPLANT
GOWN STRL REUS W/TWL LRG LVL3 (GOWN DISPOSABLE) ×6
GOWN STRL REUS W/TWL XL LVL3 (GOWN DISPOSABLE) ×2
KIT BASIN OR (CUSTOM PROCEDURE TRAY) ×2 IMPLANT
KIT TURNOVER KIT B (KITS) ×2 IMPLANT
L-HOOK LAP DISP 36CM (ELECTROSURGICAL) ×2
LHOOK LAP DISP 36CM (ELECTROSURGICAL) ×1 IMPLANT
NEEDLE 22X1 1/2 (OR ONLY) (NEEDLE) ×2 IMPLANT
NS IRRIG 1000ML POUR BTL (IV SOLUTION) ×2 IMPLANT
PAD ARMBOARD 7.5X6 YLW CONV (MISCELLANEOUS) ×2 IMPLANT
PENCIL BUTTON HOLSTER BLD 10FT (ELECTRODE) ×2 IMPLANT
POUCH RETRIEVAL ECOSAC 10 (ENDOMECHANICALS) ×1 IMPLANT
POUCH RETRIEVAL ECOSAC 10MM (ENDOMECHANICALS) ×1
SCISSORS LAP 5X35 DISP (ENDOMECHANICALS) ×1 IMPLANT
SET CHOLANGIOGRAPH 5 50 .035 (SET/KITS/TRAYS/PACK) ×1 IMPLANT
SET IRRIG TUBING LAPAROSCOPIC (IRRIGATION / IRRIGATOR) ×2 IMPLANT
SLEEVE ENDOPATH XCEL 5M (ENDOMECHANICALS) ×4 IMPLANT
SPECIMEN JAR SMALL (MISCELLANEOUS) ×2 IMPLANT
SUT VIC AB 4-0 PS2 27 (SUTURE) ×2 IMPLANT
TOWEL OR 17X24 6PK STRL BLUE (TOWEL DISPOSABLE) ×2 IMPLANT
TOWEL OR 17X26 10 PK STRL BLUE (TOWEL DISPOSABLE) ×2 IMPLANT
TRAY LAPAROSCOPIC MC (CUSTOM PROCEDURE TRAY) ×2 IMPLANT
TROCAR XCEL BLUNT TIP 100MML (ENDOMECHANICALS) ×2 IMPLANT
TROCAR XCEL NON-BLD 5MMX100MML (ENDOMECHANICALS) ×2 IMPLANT
TUBING INSUFFLATION (TUBING) ×2 IMPLANT
WATER STERILE IRR 1000ML POUR (IV SOLUTION) ×2 IMPLANT

## 2018-08-04 NOTE — H&P (Signed)
Eric Savage is an 52 y.o. male.   Chief Complaint: symptomatic cholelithiasis HPI: Eric Savage presents for laparoscopic cholecystectomy. No further attacks since I saw him in the office.  Past Medical History:  Diagnosis Date  . Acid reflux   . Anxiety   . Arthritis   . CAD S/P percutaneous coronary angioplasty 12/10   RCA/CFX DES  . Chest pain 01/2013   Evaluate cardiac catheterization, no obstructive CAD  . Diverticulitis   . Dysrhythmia    OCC PALPITATION   . High cholesterol   . History of kidney stones   . Hypertension     Past Surgical History:  Procedure Laterality Date  . COLONOSCOPY    . CORONARY ANGIOPLASTY WITH STENT PLACEMENT  09/2009   Proximal RCA - Cypher DES 3.0 mm but 33 mm postdilated to 3.7 mm; proximal CFX XIENCE V. DES 2.75 mm x 15 mm postdilated to 3.0 mm  . LAPAROSCOPIC SIGMOID COLECTOMY N/A 06/05/2017   Procedure: SIGMOID COLECTOMY;  Surgeon: Violeta Gelinas, MD;  Location: [facility] OR;  Service: General;  Laterality: N/A;  . LEFT HEART CATHETERIZATION WITH CORONARY ANGIOGRAM N/A 05/19/2012   Procedure: LEFT HEART CATHETERIZATION WITH CORONARY ANGIOGRAM;  Surgeon: Marykay Lex, MD;  Location: [facility] C[facility] LAB;  Service: Cardiovascular;  Laterality: N/A;  . LEFT HEART CATHETERIZATION WITH CORONARY ANGIOGRAM N/A 01/28/2013   Procedure: LEFT HEART CATHETERIZATION WITH CORONARY ANGIOGRAM;  Surgeon: Lennette Bihari, MD;  Location: [facility] CATH LAB;  Service: Cardiovascular:  No evidence for restenosis of either RCA or LCX. Mild LAD 20% narrowing and 60 - 70% smooth mid diagonal 2 stenosis.  Marland Kitchen NM MYOCAR PERF WALL MOTION  01/04/2011   Protocol:Bruce, post stress EF=69%, Exercise Cap 13 METS, attenuation defect in inferior region of myocardium, no sig. ischemia demonstrated  . TONSILLECTOMY  ~ 1973  . TRANSTHORACIC ECHOCARDIOGRAM  05/13/2009   EF =>55%, mild mitral regurg, trace tricuspid regurg.    Family History  Problem Relation Age of Onset  . CAD Father 29       CABG    Social History:  reports that he has been smoking cigarettes. He has a 7.50 pack-year smoking history. He has never used smokeless tobacco. He reports that he drinks about 4.0 standard drinks of alcohol per week. He reports that he does not use drugs.  Allergies: No Known Allergies  Medications Prior to Admission  Medication Sig Dispense Refill  . ALPRAZolam (XANAX) 0.5 MG tablet Take 0.5 mg by mouth as needed for anxiety.    Marland Kitchen aspirin EC 81 MG tablet Take 81 mg by mouth at bedtime.    Marland Kitchen atorvastatin (LIPITOR) 40 MG tablet TAKE 1 TABLET BY MOUTH EVERY DAY 90 tablet 2  . lisinopril (PRINIVIL,ZESTRIL) 20 MG tablet Take 1 tablet (20 mg total) by mouth daily. Overdue for yearly follow up, please call office. 30 tablet 1  . omeprazole (PRILOSEC) 40 MG capsule Take 40 mg by mouth daily.      No results found for this or any previous visit (from the past 48 hour(s)). No results found.  Review of Systems  Unable to perform ROS: Other    Blood pressure (!) 153/80, pulse (!) 48, temperature 98 F (36.7 C), temperature source Oral, resp. rate 20, height 5\' 5"  (1.651 m), weight 81.6 kg, SpO2 98 %. Physical Exam  Constitutional: He is oriented to person, place, and time. He appears well-developed and well-nourished.  HENT:  Head: Normocephalic.  Right Ear: External ear normal.  Left Ear:  External ear normal.  Mouth/Throat: Oropharynx is clear and moist.  Eyes: Pupils are equal, round, and reactive to light. EOM are normal.  Neck: No tracheal deviation present. No thyromegaly present.  Cardiovascular: Normal rate, regular rhythm and normal heart sounds.  Respiratory: Effort normal and breath sounds normal. No respiratory distress. He has no wheezes.  GI: Soft. He exhibits no distension. There is no tenderness. There is no rebound and no guarding.  Neurological: He is alert and oriented to person, place, and time.  Skin: Skin is warm.  Psychiatric: He has a normal mood and affect.      Assessment/Plan Symptomatic cholelithiasis - for laparoscopic cholecystectomy/IOC. I again discussed the procedure, risks, and benefits with him. I discussed the expected post-op course. He agrees.  [name], MD 08/04/2018, 12:36 PM

## 2018-08-04 NOTE — Anesthesia Postprocedure Evaluation (Signed)
Anesthesia Post Note  Patient: PAL SHELL  Procedure(s) Performed: LAPAROSCOPIC CHOLECYSTECTOMY (N/A )     Patient location during evaluation: PACU Anesthesia Type: General Level of consciousness: awake and alert Pain management: pain level controlled Vital Signs Assessment: post-procedure vital signs reviewed and stable Respiratory status: spontaneous breathing, nonlabored ventilation and respiratory function stable Cardiovascular status: blood pressure returned to baseline and stable Postop Assessment: no apparent nausea or vomiting Anesthetic complications: no    Last Vitals:  Vitals:   08/04/18 1508 08/04/18 1525  BP: (!) 144/78 (!) 136/91  Pulse: 69 (!) 58  Resp: 20 15  Temp: (!) 36.3 C   SpO2: 97% 98%    Last Pain:  Vitals:   08/04/18 1525  TempSrc:   PainSc: 4                  Yuridia Couts,W. EDMOND

## 2018-08-04 NOTE — Op Note (Signed)
08/04/2018  2:55 PM  PATIENT:  Eric Savage  52 y.o. male  PRE-OPERATIVE DIAGNOSIS:  Symptomatic cholelithiasis  POST-OPERATIVE DIAGNOSIS:  Symptomatic cholelithiasis  PROCEDURE:  Procedure(s): LAPAROSCOPIC CHOLECYSTECTOMY  SURGEON:  Surgeon(s): Violeta Gelinas, MD  ASSISTANTS: none   ANESTHESIA:   local and general  EBL:  Total I/O In: 1000 [I.V.:1000] Out: -   BLOOD ADMINISTERED:none  DRAINS: none   SPECIMEN:  Excision  DISPOSITION OF SPECIMEN:  PATHOLOGY  COUNTS:  YES  DICTATION: .Dragon Dictation Mr. Gutzmer presents for laparoscopic cholecystectomy.  Informed consent was obtained.  He was identified the preop holding area.  He received intravenous antibiotics.  He was brought to the operating room and general endotracheal anesthesia was administered by the anesthesia staff.  His abdomen was prepped and draped in a sterile fashion.  We did a timeout procedure.  The area at the superior portion of his midline scar was injected with local.  Incision was made there and subcutaneous tissues were dissected down revealing the fascia.  The fascia was divided but it was difficult to get into the abdomen as his peritoneum felt quite thickened.  I did place a pursestring 0 Vicryl in the fascia.  I then chose to attempt an Optiview entry in the right upper quadrant.  An area was chosen along the costal margin and was infiltrated with local.  I made a small incision and a 5 mm port was placed under Optiview visualization.  The abdomen was then insufflated.  I replaced the scope and there were no complications from the port placement.  This allowed further visualization of his midline area.  There were small bowel adhesions inferior to the area where I had tried to gain entry.  There was no injury to the bowel.  I was then able under direct vision visualization to place a Hassan trocar above the area of adhesions.  Under direct vision and then a 5 mm epigastric and an additional 5 mm  right-sided abdominal port were placed.  Local was injected at each port site.  The dome of the gallbladder was retracted superior medially.  The infundibulum was retracted inferior laterally.  Dissection began laterally and progressed medially identifying the cystic duct and anterior branch of the cystic artery.  Dissection continued until I had a critical view of safety.  The cystic duct was noted to be somewhat short so I did not do a cholangiogram.  3 clips were placed proximally in the cystic duct, one was placed distally and it was divided.  The anterior branch of the cystic artery was clipped twice proximally and divided with cautery.  The gallbladder was taken off the liver bed with cautery.  Along the way we encountered a posterior branch of the cystic artery which was clipped twice proximally and divided distally with cautery.  The gallbladder was taken the rest of the way off the liver bed with cautery.  It was placed in a bag and removed from the abdomen.  It was sent to pathology.  The liver bed was cauterized to get excellent hemostasis.  Clips remain in good position in the liver bed was dry.  The area was copiously irrigated.  Irrigation fluid was evacuated.  I then did four-quadrant inspection which revealed no comp gating features.  I checked the small bowel again which is adhesed to the anterior abdominal wall and there were no injuries.  Ports were removed under direct vision.  Pneumoperitoneum was released.  The Hassan trocar site fascia was closed  by tying the pursestring.  I then placed 2 additional 0 Vicryls in the fascia for reinforcement of the closure.  All 4 wounds were irrigated and the skin of each was closed with 4-0 Vicryl subcuticular followed by Dermabond.  All counts were correct.  He tolerated the procedure well without apparent complication and was taken recovery in stable condition. PATIENT DISPOSITION:  PACU - hemodynamically stable.   Delay start of Pharmacological VTE  agent (>24hrs) due to surgical blood loss or risk of bleeding:  no  Violeta Gelinas, MD, MPH, FACS Pager: [phone]  10/28/20192:55 PM

## 2018-08-04 NOTE — Interval H&P Note (Signed)
History and Physical Interval Note:  08/04/2018 12:39 PM  Eric Savage  has presented today for surgery, with the diagnosis of Symptomatic cholelithiasis  The various methods of treatment have been discussed with the patient and family. After consideration of risks, benefits and other options for treatment, the patient has consented to  Procedure(s): LAPAROSCOPIC CHOLECYSTECTOMY WITH INTRAOPERATIVE CHOLANGIOGRAM ERAS PATHWAY (N/A) as a surgical intervention .  The patient's history has been reviewed, patient examined, no change in status, stable for surgery.  I have reviewed the patient's chart and labs.  Questions were answered to the patient's satisfaction.     [name]

## 2018-08-04 NOTE — Transfer of Care (Signed)
Immediate Anesthesia Transfer of Care Note  Patient: Eric Savage  Procedure(s) Performed: LAPAROSCOPIC CHOLECYSTECTOMY (N/A )  Patient Location: PACU  Anesthesia Type:General  Level of Consciousness: awake, alert  and oriented  Airway & Oxygen Therapy: Patient Spontanous Breathing and Patient connected to nasal cannula oxygen  Post-op Assessment: Report given to RN, Post -op Vital signs reviewed and stable and Patient moving all extremities  Post vital signs: Reviewed and stable  Last Vitals:  Vitals Value Taken Time  BP 144/78 08/04/2018  3:08 PM  Temp    Pulse 70 08/04/2018  3:13 PM  Resp 14 08/04/2018  3:13 PM  SpO2 97 % 08/04/2018  3:13 PM  Vitals shown include unvalidated device data.  Last Pain:  Vitals:   08/04/18 1118  TempSrc: Oral      Patients Stated Pain Goal: 3 (08/04/18 1126)  Complications: No apparent anesthesia complications

## 2018-08-04 NOTE — Anesthesia Preprocedure Evaluation (Addendum)
Anesthesia Evaluation  Patient identified by MRN, date of birth, ID band Patient awake    Reviewed: Allergy & Precautions, NPO status , Patient's Chart, lab work & pertinent test results  Airway Mallampati: II  TM Distance: >3 FB Neck ROM: Full    Dental no notable dental hx. (+) Dental Advisory Given   Pulmonary Current Smoker,    Pulmonary exam normal breath sounds clear to auscultation       Cardiovascular hypertension, Pt. on medications + CAD and + Cardiac Stents  Normal cardiovascular exam+ dysrhythmias  Rhythm:Regular Rate:Normal     Neuro/Psych negative neurological ROS     GI/Hepatic Neg liver ROS, GERD  Medicated,  Endo/Other  negative endocrine ROS  Renal/GU negative Renal ROS     Musculoskeletal  (+) Arthritis ,   Abdominal (+) + obese,   Peds  Hematology negative hematology ROS (+)   Anesthesia Other Findings Day of surgery medications reviewed with the patient. [name]  2D Echocardiogram  Order# [id number]  Ordering physician: [provider] Study date: 05/13/2009 Patient Information   Patient Name Eric Savage, Eric Savage Sex Male DOB 1966-09-20 SSN [id number] Reason for Exam  Priority: Routine  Comments: This order was created through External Result Entry Surgical History   Surgical History    Procedure Laterality Date Comment Source CORONARY ANGIOPLASTY WITH STENT PLACEMENT  09/2009 Proximal RCA - Cypher DES 3.0 mm but 33 mm postdilated to 3.7 mm; proximal CFX XIENCE V. DES 2.75 mm x 15 mm postdilated to 3.0 mm Provider  Other Surgical History    Procedure Laterality Date Comment Source COLONOSCOPY    Provider LAPAROSCOPIC SIGMOID COLECTOMY N/A 06/05/2017 Procedure: SIGMOID COLECTOMY; Surgeon: Violeta Gelinas, MD; Location: [facility] OR; Service: General; Laterality: N/A; Provider LEFT HEART CATHETERIZATION WITH CORONARY ANGIOGRAM N/A 05/19/2012 Procedure: LEFT HEART  CATHETERIZATION WITH CORONARY ANGIOGRAM; Surgeon: Marykay Lex, MD; Location: [facility] CATH LAB; Service: Cardiovascular; Laterality: N/A; Provider LEFT HEART CATHETERIZATION WITH CORONARY ANGIOGRAM N/A 01/28/2013 Procedure: LEFT HEART CATHETERIZATION WITH CORONARY ANGIOGRAM; Surgeon: Lennette Bihari, MD; Location: [facility] CATH LAB; Service: Cardiovascular: No evidence for restenosis of either RCA or LCX. Mild LAD 20% narrowing and 60 - 70% smooth mid diagonal 2 stenosis. Provider NM MYOCAR PERF WALL MOTION  01/04/2011 Protocol:Bruce, post stress EF=69%, Exercise Cap 13 METS, attenuation defect in inferior region of myocardium, no sig. ischemia demonstrated Provider TONSILLECTOMY  ~ 1973  Provider TRANSTHORACIC ECHOCARDIOGRAM  05/13/2009 EF =>55%, mild mitral regurg, trace tricuspid regurg. Provider  Performing Technologist/Nurse   Performing Technologist/Nurse:     Reproductive/Obstetrics                            Anesthesia Physical  Anesthesia Plan  ASA: III  Anesthesia Plan: General   Post-op Pain Management:    Induction: Intravenous  PONV Risk Score and Plan: 2 and Ondansetron and Dexamethasone  Airway Management Planned: Oral ETT  Additional Equipment:   Intra-op Plan:   Post-operative Plan: Extubation in OR  Informed Consent: I have reviewed the patients History and Physical, chart, labs and discussed the procedure including the risks, benefits and alternatives for the proposed anesthesia with the patient or authorized representative who has indicated his/her understanding and acceptance.   Dental advisory given  Plan Discussed with: CRNA and Surgeon  Anesthesia Plan Comments:         Anesthesia Quick Evaluation

## 2018-08-04 NOTE — Anesthesia Procedure Notes (Signed)
Procedure Name: Intubation Date/Time: 08/04/2018 1:54 PM Performed by: Justinian Miano T, CRNA Pre-anesthesia Checklist: Patient identified, Emergency Drugs available, Suction available and Patient being monitored Patient Re-evaluated:Patient Re-evaluated prior to induction Oxygen Delivery Method: Circle system utilized Preoxygenation: Pre-oxygenation with 100% oxygen Induction Type: IV induction Ventilation: Mask ventilation without difficulty Laryngoscope Size: Miller and 3 Grade View: Grade II Tube type: Oral Tube size: 7.5 mm Number of attempts: 1 Airway Equipment and Method: Patient positioned with wedge pillow and Stylet Placement Confirmation: ETT inserted through vocal cords under direct vision,  positive ETCO2 and breath sounds checked- equal and bilateral Secured at: 22 cm Tube secured with: Tape Dental Injury: Teeth and Oropharynx as per pre-operative assessment

## 2018-08-05 ENCOUNTER — Encounter (HOSPITAL_COMMUNITY): Payer: Self-pay | Admitting: General Surgery

## 2018-08-18 ENCOUNTER — Other Ambulatory Visit: Payer: Self-pay | Admitting: Cardiology

## 2018-09-01 ENCOUNTER — Other Ambulatory Visit: Payer: Self-pay | Admitting: Cardiology

## 2018-10-08 HISTORY — PX: OTHER SURGICAL HISTORY: SHX169

## 2019-01-17 ENCOUNTER — Other Ambulatory Visit: Payer: Self-pay | Admitting: Cardiology

## 2019-03-04 ENCOUNTER — Telehealth: Payer: Self-pay | Admitting: *Deleted

## 2019-03-04 NOTE — Telephone Encounter (Signed)
   TELEPHONE CALL NOTE  This patient has been deemed a candidate for follow-up tele-health visit to limit community exposure during the Covid-19 pandemic. I spoke with the patient via phone to discuss instructions. This has been outlined on the patient's AVS (dotphrase: hcevisitinfo). The patient was advised to review the section on consent for treatment as well. The patient will receive a phone call 2-3 days prior to their E-Visit at which time consent will be verbally confirmed.   A Virtual Office Visit appointment type has been scheduled for 03/09/19 with Herbie Baltimore  with "VIDEO/TEXT .     Tobin Chad, RN 03/04/2019 3:34 PM

## 2019-03-06 ENCOUNTER — Telehealth: Payer: Self-pay | Admitting: Cardiology

## 2019-03-06 NOTE — Telephone Encounter (Signed)
Declined mychart, smartphone, consent (verbal), pre reg complete 03/06/19 AF

## 2019-03-09 ENCOUNTER — Encounter: Payer: Self-pay | Admitting: Cardiology

## 2019-03-09 ENCOUNTER — Telehealth (INDEPENDENT_AMBULATORY_CARE_PROVIDER_SITE_OTHER): Payer: 59 | Admitting: Cardiology

## 2019-03-09 ENCOUNTER — Telehealth: Payer: Self-pay | Admitting: *Deleted

## 2019-03-09 VITALS — Ht 65.0 in | Wt 180.0 lb

## 2019-03-09 DIAGNOSIS — Z716 Tobacco abuse counseling: Secondary | ICD-10-CM

## 2019-03-09 DIAGNOSIS — I2510 Atherosclerotic heart disease of native coronary artery without angina pectoris: Secondary | ICD-10-CM

## 2019-03-09 DIAGNOSIS — I10 Essential (primary) hypertension: Secondary | ICD-10-CM

## 2019-03-09 DIAGNOSIS — Z9861 Coronary angioplasty status: Secondary | ICD-10-CM

## 2019-03-09 DIAGNOSIS — E785 Hyperlipidemia, unspecified: Secondary | ICD-10-CM

## 2019-03-09 NOTE — Assessment & Plan Note (Signed)
He was switched from simvastatin to atorvastatin in the last 2 years, most likely because of poorly controlled lipids.  Last LDL lab from 2018 was not well controlled.  He said he had labs done by his PCP, but not sure when.  We will try to obtain these labs and then potentially readdress lipids based on those results.  If they have not been drawn within this calendar year, he will need a fasting lipid panel and chemistry panel.  Low threshold to consider adding Zetia versus possible PCSK9 inhibitor

## 2019-03-09 NOTE — Assessment & Plan Note (Signed)
Blood pressure has been well controlled as long as he takes medications.  He has not had any high readings when he sees his PCP.  Did not have recordings for today, but the last few times is been checked is been fine.  Continue lisinopril. No beta-blocker because a history of fatigue and bradycardia.

## 2019-03-09 NOTE — Patient Instructions (Addendum)
Medication Instructions:   No changes    If you need a refill on your cardiac medications before your next appointment, please call your pharmacy.   Lab work:  --We will try to get labs from your PCP but has not been labs checked within the last year, we should get either at her office or through our office a fasting lipid panel, chemistry panel and A1c. Checked no labs since April - they were CBC PLEASE HAVE THESE LABS DONE FASTING LIPID , CMP  Hgba1c- do not eat or drink the morning of the lab test  If you have labs (blood work) drawn today and your tests are completely normal, you will receive your results only by: Marland Kitchen MyChart Message (if you have MyChart) OR . A paper copy in the mail If you have any lab test that is abnormal or we need to change your treatment, we will call you to review the results.  Testing/Procedures: Not needed  Follow-Up: At [facility], you and your health needs are our priority.  As part of our continuing mission to provide you with exceptional heart care, we have created designated Provider Care Teams.  These Care Teams include your primary Cardiologist (physician) and Advanced Practice Providers (APPs -  Physician Assistants and Nurse Practitioners) who all work together to provide you with the care you need, when you need it. . You will need a follow up appointment in   6 months DEC 2020.  Please call our office 2 months in advance to schedule this appointment.  You may see [name], MD or one of the following Advanced Practice Providers on your designated Care Team:   . Theodore Demark, PA-C . Joni Reining, DNP, ANP  Any Other Special Instructions Will Be Listed Below (If Applicable).

## 2019-03-09 NOTE — Assessment & Plan Note (Addendum)
Smoking cessation instruction/counseling given:  counseled patient on the dangers of tobacco use, advised patient to stop smoking, and reviewed strategies to maximize success; 2 min  He does not seem to be all that interested in quitting.  Not yet in the contemplative state.  Has cut down, but not ready to make the final step

## 2019-03-09 NOTE — Assessment & Plan Note (Signed)
No recurrent angina symptoms.  Good short little discomfort symptoms he is having are not very frequent, and are not with particular activity.  If they do seem to become more consistent and more prevalent with activity, would probably want to consider ischemic evaluation.  But for now we will simply follow.  Is on aspirin, statin and ACE inhibitor.  Not on beta-blocker for reasons noted above.

## 2019-03-09 NOTE — Progress Notes (Signed)
Virtual Visit via Telephone Note   This visit type was conducted due to national recommendations for restrictions regarding the COVID-19 Pandemic (e.g. social distancing) in an effort to limit this patient's exposure and mitigate transmission in our community.  Due to his co-morbid illnesses, this patient is at least at moderate risk for complications without adequate follow up.  This format is felt to be most appropriate for this patient at this time.  The patient did not have access to video technology/had technical difficulties with video requiring transitioning to audio format only (telephone).  All issues noted in this document were discussed and addressed.  No physical exam could be performed with this format.  Please refer to the patient's chart for his  consent to telehealth for [facility].   No Video capability.  Patient has given verbal permission to conduct this visit via virtual appointment and to bill insurance 03/04/2019 3:35 PM     Evaluation Performed:  Follow-up visit  Date:  03/09/2019   ID:  Eric BurDonald J Beil, DOB [birth year], MRN [id number]  Patient Location: Home Provider Location: Home  PCP:  Marva PandaMillsaps, Kimberly, NP  Cardiologist:  Bryan Lemmaavid Baldwin Racicot, MD  Electrophysiologist:  None   Chief Complaint:  ~2 yr follow-up; was supposed to be scheduled to see and APP though 1 year follow-up, unfortunately this was not scheduled.  History of Present Illness:    Eric Savage is a 53 y.o. male with PMH notable for CAD-PCI (2010 - PCI RCA & Cx) who presents via audio/video conferencing for a telehealth visit today.  Eric BurDonald J Mathison was last seen in March 2018, was doing very well with no major complaints.  Was working on smoking cessation. --> Not on beta-blocker because of fatigue and bradycardia.   -- Is on aspirin, statin and ACE inhibitor.  No longer on Plavix.  -Laparoscopic cholecystectomy August 04, 2018  Interval History:  Penelope Coopretty much doing well. Only concern  is occasional chest discomfort that will take his breath away - last ~3-5 min.  Has noted several episodes in the past few months.  Can occur either at rest or while active - but usually when active.  Different than his angina pain.   He does note occasional palpitations - but this Sx is different.  Not fast - just skipping; short-lived.  Usually with routine exertion - no chest pains - just general body aching (getting old).  Only notes the few episodes of SOB - otherwise no DOE, PND or orthopnea. NO edema.  Has had some vertigo a few months ago, but no syncope or near syncope.  Was related to middle ear issue - better after having ears cleaned out.  No TIA/amaurosis fugax  The patient does not have symptoms concerning for COVID-19 infection (fever, chills, cough, or new shortness of breath).  The patient is practicing social distancing.  ROS:  Please see the history of present illness.    Review of Systems  HENT: Negative for congestion and nosebleeds.   Respiratory: Positive for cough (only in morning) and sputum production.   Cardiovascular: Negative for claudication.  Gastrointestinal: Negative for abdominal pain, blood in stool, heartburn, melena, nausea and vomiting.       Still gets diverticulitis with certain foods - cannot eat strawberries (or any fruit seeds).  Genitourinary: Negative for hematuria.  Musculoskeletal: Negative for joint pain.       Legs may ache if he had a hard day work the previous day  Neurological: Negative for dizziness (none  since bout of vertigo a few months ago), weakness and headaches.  Psychiatric/Behavioral: Negative.     Past Medical History:  Diagnosis Date  . Acid reflux   . Anxiety   . Arthritis   . CAD S/P percutaneous coronary angioplasty 12/10   RCA/CFX DES  . Chest pain 01/2013   Evaluate cardiac catheterization, no obstructive CAD  . Diverticulitis   . Dysrhythmia    OCC PALPITATION   . High cholesterol   . History of kidney stones    . Hypertension    Past Surgical History:  Procedure Laterality Date  . CHOLECYSTECTOMY N/A 08/04/2018   Procedure: LAPAROSCOPIC CHOLECYSTECTOMY;  Surgeon: Violeta Gelinas, MD;  Location: [facility] OR;  Service: General;  Laterality: N/A;  . COLONOSCOPY    . CORONARY ANGIOPLASTY WITH STENT PLACEMENT  09/2009   Proximal RCA (95%-60%) - Cypher DES 3.0 mm but 33 mm --> 3.75 mm; pCFX 80%: PCI - XIENCE V. DES 2.75 mm x 15 mm --> 3.0 mm  . LAPAROSCOPIC SIGMOID COLECTOMY N/A 06/05/2017   Procedure: SIGMOID COLECTOMY;  Surgeon: Violeta Gelinas, MD;  Location: [facility] OR;  Service: General;  Laterality: N/A;  . LEFT HEART CATHETERIZATION WITH CORONARY ANGIOGRAM N/A 05/19/2012   Procedure: LEFT HEART CATHETERIZATION WITH CORONARY ANGIOGRAM;  Surgeon: Marykay Lex, MD;  Location: [facility] CATH LAB;::  Cx & RCA stents patent. Moderate D1 & D2 as well as ostial AVG Cx.    Marland Kitchen LEFT HEART CATHETERIZATION WITH CORONARY ANGIOGRAM N/A 01/28/2013   Procedure: LEFT HEART CATHETERIZATION WITH CORONARY ANGIOGRAM;  Surgeon: Lennette Bihari, MD;  Location: [facility] CATH LAB;  Service: Cardiovascular:  No evidence for restenosis of either RCA or LCX. Mild LAD 20% narrowing and 60 - 70% smooth mid diagonal 2 stenosis.  Marland Kitchen NM MYOCAR PERF WALL MOTION  01/04/2011   Protocol:Bruce, post stress EF=69%, Exercise Cap 13 METS, attenuation defect in inferior region of myocardium, no sig. ischemia demonstrated  . TONSILLECTOMY  ~ 1973  . TRANSTHORACIC ECHOCARDIOGRAM  05/13/2009   EF =>55%, mild mitral regurg, trace tricuspid regurg.     Current Meds  Medication Sig  . ALPRAZolam (XANAX) 0.5 MG tablet Take 0.5 mg by mouth as needed for anxiety.  Marland Kitchen aspirin EC 81 MG tablet Take 81 mg by mouth at bedtime.  Marland Kitchen atorvastatin (LIPITOR) 40 MG tablet TAKE 1 TABLET BY MOUTH EVERY DAY  . lisinopril (PRINIVIL,ZESTRIL) 20 MG tablet TAKE 1 TABLET (20 MG TOTAL) BY MOUTH DAILY. NEED OV.  Marland Kitchen omeprazole (PRILOSEC) 40 MG capsule Take 40 mg by mouth daily.      Allergies:   Patient has no known allergies.   Social History   Tobacco Use  . Smoking status: Current Every Day Smoker    Packs/day: 0.25    Years: 30.00    Pack years: 7.50    Types: Cigarettes  . Smokeless tobacco: Never Used  Substance Use Topics  . Alcohol use: Yes    Alcohol/week: 4.0 standard drinks    Types: 4 Shots of liquor per week    Comment: 01/28/2013 "takes me a month to drink 1/5th"  . Drug use: No    Frequency: 7.0 times per week    Types: Marijuana, LSD    Comment: 01/28/2013 last drug use ">10 yr ago"; had reported marijuana use several times a week    Less than 1 PPD (maybe 1/2 PPD) -- still trying cut back.   Family Hx: The patient's family history includes CAD (age of onset: 86) in  his father.   Prior CV studies:   The following studies were reviewed today: . none  Labs/Other Tests and Data Reviewed:    EKG:  No ECG reviewed.  Recent Labs: 07/28/2018: ALT 20; BUN 11; Creatinine, Ser 0.98; Hemoglobin 14.7; Platelets 226; Potassium 4.1; Sodium 138   Recent Lipid Panel - PCP checked last year (not available). Lab Results  Component Value Date/Time   CHOL 208 (H) 12/18/2016 10:34 AM   TRIG 95 12/18/2016 10:34 AM   HDL 40 (L) 12/18/2016 10:34 AM   CHOLHDL 5.2 12/18/2016 10:34 AM   LDLCALC 149 (H) 12/18/2016 10:34 AM    Wt Readings from Last 3 Encounters:  03/09/19 180 lb (81.6 kg)  08/04/18 180 lb (81.6 kg)  07/28/18 187 lb 14.4 oz (85.2 kg)     Objective:    Vital Signs:  Ht  (1.651 m)   Wt 180 lb (81.6 kg)   BMI 29.95 kg/m   VITAL SIGNS:  reviewed GEN:  no acute distress RESPIRATORY:  non-labored NEURO:  alert and oriented x 3, no obvious focal deficit PSYCH:  normal affect   ASSESSMENT & PLAN:    Problem List Items Addressed This Visit    CAD, RCA/CFX DES 12/10 with residual 50-60% LAD.- Cath 05/19/12- and 01/28/13 no ISR- med Rx - Primary (Chronic)    No recurrent angina symptoms.  Good short little discomfort symptoms he  is having are not very frequent, and are not with particular activity.  If they do seem to become more consistent and more prevalent with activity, would probably want to consider ischemic evaluation.  But for now we will simply follow.  Is on aspirin, statin and ACE inhibitor.  Not on beta-blocker for reasons noted above.      Relevant Orders   Lipid panel   Comprehensive metabolic panel   Hemoglobin A1c   Dyslipidemia, goal LDL below 70 (Chronic)    He was switched from simvastatin to atorvastatin in the last 2 years, most likely because of poorly controlled lipids.  Last LDL lab from 2018 was not well controlled.  He said he had labs done by his PCP, but not sure when.  We will try to obtain these labs and then potentially readdress lipids based on those results.  If they have not been drawn within this calendar year, he will need a fasting lipid panel and chemistry panel.  Low threshold to consider adding Zetia versus possible PCSK9 inhibitor      Relevant Orders   Lipid panel   Comprehensive metabolic panel   Hemoglobin A1c   Essential hypertension (Chronic)    Blood pressure has been well controlled as long as he takes medications.  He has not had any high readings when he sees his PCP.  Did not have recordings for today, but the last few times is been checked is been fine.  Continue lisinopril. No beta-blocker because a history of fatigue and bradycardia.      Tobacco abuse counseling (Chronic)    Smoking cessation instruction/counseling given:  counseled patient on the dangers of tobacco use, advised patient to stop smoking, and reviewed strategies to maximize success; 2 min  He does not seem to be all that interested in quitting.  Not yet in the contemplative state.  Has cut down, but not ready to make the final step         COVID-19 Education: The signs and symptoms of COVID-19 were discussed with the patient and how to seek  care for testing (follow up with PCP or  arrange E-visit).   The importance of social distancing was discussed today.  Time:   Today, I have spent 18 minutes with the patient with telehealth technology discussing the above problems.     Medication Adjustments/Labs and Tests Ordered: Current medicines are reviewed at length with the patient today.  Concerns regarding medicines are outlined above.  Medication Instructions:   none  Tests Ordered: Orders Placed This Encounter  Procedures  . Lipid panel  . Comprehensive metabolic panel  . Hemoglobin A1c  --We will try to get labs from your PCP but has not been labs checked within the last year, we should get either at her office or through our office a fasting lipid panel, chemistry panel and A1c.  Medication Changes: No orders of the defined types were placed in this encounter. None  Disposition:  Follow up in 6 month(s)    Signed, [name], MD  03/09/2019 5:53 PM    [facility]

## 2019-03-09 NOTE — Telephone Encounter (Signed)
Spoke to patient - instruction given -from tele-visit 03/09/19 avs summary and labslip will be mailed to patient . Patient verbalized understanding.

## 2019-03-24 ENCOUNTER — Other Ambulatory Visit: Payer: Self-pay | Admitting: Cardiology

## 2019-05-16 ENCOUNTER — Other Ambulatory Visit: Payer: Self-pay | Admitting: Cardiology

## 2019-06-04 ENCOUNTER — Other Ambulatory Visit (HOSPITAL_COMMUNITY): Payer: Self-pay | Admitting: *Deleted

## 2019-06-04 ENCOUNTER — Other Ambulatory Visit: Payer: Self-pay | Admitting: *Deleted

## 2019-06-05 ENCOUNTER — Other Ambulatory Visit: Payer: Self-pay | Admitting: Diagnostic Radiology

## 2019-06-05 ENCOUNTER — Other Ambulatory Visit: Payer: Self-pay

## 2019-06-05 ENCOUNTER — Ambulatory Visit
Admission: RE | Admit: 2019-06-05 | Discharge: 2019-06-05 | Disposition: A | Discharge status: Home / Self Care | Payer: 59 | Source: Ambulatory Visit | Attending: Diagnostic Radiology | Admitting: Diagnostic Radiology

## 2019-06-05 DIAGNOSIS — R319 Hematuria, unspecified: Secondary | ICD-10-CM

## 2019-06-05 DIAGNOSIS — R109 Unspecified abdominal pain: Secondary | ICD-10-CM

## 2019-06-05 MED ORDER — IOPAMIDOL (ISOVUE-300) INJECTION 61%
100.0000 mL | Freq: Once | INTRAVENOUS | Status: AC | PRN
  Administered 2019-06-05: 100 mL via INTRAVENOUS

## 2019-06-07 ENCOUNTER — Other Ambulatory Visit: Payer: Self-pay | Admitting: Cardiology

## 2019-08-05 ENCOUNTER — Encounter (HOSPITAL_COMMUNITY): Payer: Self-pay | Admitting: Emergency Medicine

## 2019-08-05 ENCOUNTER — Emergency Department (HOSPITAL_COMMUNITY): Payer: 59

## 2019-08-05 ENCOUNTER — Other Ambulatory Visit: Payer: Self-pay

## 2019-08-05 ENCOUNTER — Emergency Department (HOSPITAL_COMMUNITY)
Admission: EM | Admit: 2019-08-05 | Discharge: 2019-08-05 | Disposition: A | Discharge status: Home / Self Care | Payer: 59 | Attending: Emergency Medicine | Admitting: Emergency Medicine

## 2019-08-05 DIAGNOSIS — R42 Dizziness and giddiness: Secondary | ICD-10-CM | POA: Insufficient documentation

## 2019-08-05 DIAGNOSIS — I4891 Unspecified atrial fibrillation: Secondary | ICD-10-CM | POA: Diagnosis not present

## 2019-08-05 DIAGNOSIS — I10 Essential (primary) hypertension: Secondary | ICD-10-CM | POA: Diagnosis not present

## 2019-08-05 DIAGNOSIS — Z79899 Other long term (current) drug therapy: Secondary | ICD-10-CM | POA: Insufficient documentation

## 2019-08-05 DIAGNOSIS — Z7982 Long term (current) use of aspirin: Secondary | ICD-10-CM | POA: Diagnosis not present

## 2019-08-05 DIAGNOSIS — Z955 Presence of coronary angioplasty implant and graft: Secondary | ICD-10-CM | POA: Diagnosis not present

## 2019-08-05 DIAGNOSIS — R110 Nausea: Secondary | ICD-10-CM | POA: Insufficient documentation

## 2019-08-05 DIAGNOSIS — R079 Chest pain, unspecified: Secondary | ICD-10-CM | POA: Diagnosis present

## 2019-08-05 DIAGNOSIS — I2510 Atherosclerotic heart disease of native coronary artery without angina pectoris: Secondary | ICD-10-CM | POA: Insufficient documentation

## 2019-08-05 DIAGNOSIS — F17210 Nicotine dependence, cigarettes, uncomplicated: Secondary | ICD-10-CM | POA: Insufficient documentation

## 2019-08-05 LAB — BASIC METABOLIC PANEL
Anion gap: 9 (ref 5–15)
BUN: 10 mg/dL (ref 6–20)
CO2: 26 mmol/L (ref 22–32)
Calcium: 8.9 mg/dL (ref 8.9–10.3)
Chloride: 107 mmol/L (ref 98–111)
Creatinine, Ser: 1.05 mg/dL (ref 0.61–1.24)
GFR calc Af Amer: >60 mL/min (ref >60)
GFR calc non Af Amer: >60 mL/min (ref >60)
Glucose, Bld: 92 mg/dL (ref 70–99)
Potassium: 3.4 mmol/L — ABNORMAL LOW (ref 3.5–5.1)
Sodium: 142 mmol/L (ref 135–145)

## 2019-08-05 LAB — CBC
HCT: 45.8 % (ref 39.0–52.0)
Hemoglobin: 15.1 g/dL (ref 13.0–17.0)
MCH: 31.5 pg (ref 26.0–34.0)
MCHC: 33 g/dL (ref 30.0–36.0)
MCV: 95.4 fL (ref 80.0–100.0)
Platelets: 206 10*3/uL (ref 150–400)
RBC: 4.8 MIL/uL (ref 4.22–5.81)
RDW: 11.9 % (ref 11.5–15.5)
WBC: 8.5 10*3/uL (ref 4.0–10.5)
nRBC: 0 % (ref 0.0–0.2)

## 2019-08-05 LAB — TSH: TSH: 2.782 u[IU]/mL (ref 0.350–4.500)

## 2019-08-05 LAB — TROPONIN I (HIGH SENSITIVITY)
Troponin I (High Sensitivity): 3 ng/L (ref <18)
Troponin I (High Sensitivity): 5 ng/L (ref <18)

## 2019-08-05 MED ORDER — METOPROLOL TARTRATE 25 MG PO TABS: 25.0000 mg | ORAL_TABLET | Freq: Two times a day (BID) | ORAL | 0 refills | Status: DC

## 2019-08-05 MED ORDER — RIVAROXABAN 20 MG PO TABS
20.0000 mg | ORAL_TABLET | Freq: Every day | ORAL | Status: DC
  Administered 2019-08-05: 20 mg via ORAL
  Filled 2019-08-05: qty 1

## 2019-08-05 MED ORDER — METOPROLOL TARTRATE 5 MG/5ML IV SOLN
5.0000 mg | Freq: Once | INTRAVENOUS | Status: DC
  Filled 2019-08-05: qty 5

## 2019-08-05 MED ORDER — SODIUM CHLORIDE 0.9 % IV BOLUS
1000.0000 mL | Freq: Once | INTRAVENOUS | Status: AC
  Administered 2019-08-05: 1000 mL via INTRAVENOUS

## 2019-08-05 MED ORDER — RIVAROXABAN (XARELTO) EDUCATION KIT FOR AFIB PATIENTS
PACK | Freq: Once | Status: AC
  Administered 2019-08-05: 19:00:00
  Filled 2019-08-05: qty 1

## 2019-08-05 NOTE — ED Provider Notes (Signed)
  Physical Exam  BP 117/77   Pulse (!) 53   Temp 98.5 F (36.9 C) (Oral)   Resp 15   Wt 82.6 kg   SpO2 99%   BMI 30.29 kg/m   Physical Exam  ED Course/Procedures     Procedures  MDM  Patient care assumed from Sims PA at shift change, please see his note for a full HPI. 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  Heart rate was around the 150s to 170s on arrival, was given 400 mg of fluid.  He does voice several episodes of heart palpitations and chest discomfort for the past several weeks.  He does have a prior history of CAD, including a cardiac stent approximately 10 years ago along with a cardiac cath 7 years ago by his cardiologist Dr. Ellyn Hack.  Plan is for patient to receive Lopressor to control his rate, he does not have any chest pain at this time.  A delta tropwas also pending.  5:02 PM Patient has yet to receive Lopressor. Heart rate found to be 84, blood pressure is 127/89, will hold off on Lopressor at this time. BP 117/77   Pulse (!) 53   Temp 98.5 F (36.9 C) (Oral)   Resp 15   Wt 82.6 kg   SpO2 99%   BMI 30.29 kg/m   This patients CHA2DS2-VASc Score and unadjusted Ischemic Stroke Rate (% per year) is equal to 2.2 % stroke rate/year from a score of 2  5:07 PM A repeat EKG was obtained HR 53 Rhythm NSR  5:13 PM Spoke to Dr. Ellyn Hack, cardiologist as patient's symptoms have improved and lower CHADVASC he may be started on Lopressor 25 mg twice daily, Xarelto should also be added to his therapy.  I have discussed this plan with patient.  Will place call for pharmacy consult to help with education of Xarelto.  6:02 PM Spoke to patient who is agreeable on being discharged, has been educated on Xarelto. Will give one dose of Xarelto today along with xarelto pack and coupon.   7:27 PM Spoke to lab due to delay in trop, delta trop resulted 5. Patient in NSR stable for  discharge.   Portions of this note were generated with Lobbyist. Dictation errors may occur despite best attempts at proofreading.     Janeece Fitting, PA-C 08/05/19 1931    Lacretia Leigh, MD 08/06/19 1339

## 2019-08-05 NOTE — Progress Notes (Signed)
   [name] was just seen in the [facility] Emergency Room with A. fib RVR.  This was a new diagnosis of A. fib.  He converted to sinus rhythm prior to being treated with any beta-blocker in the ER.   He will be discharged in the ER.  Plan will be to start him on 25 mg twice daily Lopressor and 20 mg daily Xarelto.  We will contact him to schedule a follow-up visit likely with APP.  Plan will be to consider event monitor and likely ischemic evaluation with a stress test.  Glenetta Hew, MD

## 2019-08-05 NOTE — ED Provider Notes (Signed)
[facility] EMERGENCY DEPARTMENT Provider Note   CSN: [id number] Arrival date & time: 08/05/19  1429     History   Chief Complaint Chief Complaint  Patient presents with  . Chest Pain  . Tachycardia    HPI Eric Savage is a 53 y.o. male.     The history is provided by the patient and medical records. No language interpreter was used.  Chest Pain    53 year old male with history of CAD which include multivessel disease, smoker, obesity, hypertension brought here via EMS from home for evaluation of chest pain.  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  He described as a pressure sensation across his chest radiates towards his jaw with some lightheadedness and mild nausea.  The discomfort was moderate in severity and patient subsequently went to a local fire station for his symptoms.  He was noted to have an elevated heart rate of 150s to 170s.  EMS arrived, patient was given 400 mL of IV fluid and brought to the ER.  On route, his heart rate improved and patient felt much better.  At this time he does not complain of any chest discomfort or heart palpitation.  He denies any recent sickness.  He report having several similar episodes of chest discomfort sporadically for the past several weeks.  He denies any recent sick contact.  He denies any nausea or vomiting or diaphoresis.  No fever or productive cough.  Denies any recent use of energy drinks other stimulant.  He has been compliant with his medication.  He admits to tobacco use nearly a pack a day.  He recall having cardiac stenting approximately 10 years ago and having heart cath approximately 7 years ago.  His cardiologist is Dr. Ellyn Hack.  Past Medical History:  Diagnosis Date  . Acid reflux   . Anxiety   . Arthritis   . CAD S/P percutaneous coronary angioplasty 12/10   RCA/CFX DES  .  Chest pain 01/2013   Evaluate cardiac catheterization, no obstructive CAD  . Diverticulitis   . Dysrhythmia    OCC PALPITATION   . High cholesterol   . History of kidney stones   . Hypertension     Patient Active Problem List   Diagnosis Date Noted  . S/P colectomy 06/05/2017  . Preoperative cardiovascular examination 12/06/2016  . Diverticulitis large intestine w/o perforation or abscess w/o bleeding   . Diverticulitis 09/08/2016  . Obesity (BMI 30-39.9) 04/26/2014  . Tobacco abuse counseling 04/12/2013  . Non-compliance with treatment 01/28/2013  . Essential hypertension 05/16/2012  . Dyslipidemia, goal LDL below 70 05/16/2012  . Smoker 05/16/2012  . CAD, RCA/CFX DES 12/10 with residual 50-60% LAD.- Cath 05/19/12- and 01/28/13 no ISR- med Rx 09/15/2009    Past Surgical History:  Procedure Laterality Date  . CHOLECYSTECTOMY N/A 08/04/2018   Procedure: LAPAROSCOPIC CHOLECYSTECTOMY;  Surgeon: Georganna Skeans, MD;  Location: [facility];  Service: General;  Laterality: N/A;  . COLONOSCOPY    . CORONARY ANGIOPLASTY WITH STENT PLACEMENT  09/2009   Proximal RCA (95%-60%) - Cypher DES 3.0 mm but 33 mm --> 3.75 mm; pCFX 80%: PCI - XIENCE V. DES 2.75 mm x 15 mm --> 3.0 mm  . LAPAROSCOPIC SIGMOID COLECTOMY N/A 06/05/2017   Procedure: SIGMOID COLECTOMY;  Surgeon: Georganna Skeans, MD;  Location: [facility];  Service: General;  Laterality: N/A;  .  LEFT HEART CATHETERIZATION WITH CORONARY ANGIOGRAM N/A 05/19/2012   Procedure: LEFT HEART CATHETERIZATION WITH CORONARY ANGIOGRAM;  Surgeon: Marykay Lexavid W [name], MD;  [facility];::  Cx & RCA stents patent. Moderate D1 & D2 as well as ostial AVG Cx.    Marland Kitchen. LEFT HEART CATHETERIZATION WITH CORONARY ANGIOGRAM N/A 01/28/2013   Procedure: LEFT HEART CATHETERIZATION WITH CORONARY ANGIOGRAM;  Surgeon: Lennette Biharihomas A Kelly, MD;  [facility];  Service: Cardiovascular:  No evidence for restenosis of either RCA or LCX. Mild LAD 20% narrowing and 60 - 70% smooth mid  diagonal 2 stenosis.  Marland Kitchen. NM MYOCAR PERF WALL MOTION  01/04/2011   Protocol:Bruce, post stress EF=69%, Exercise Cap 13 METS, attenuation defect in inferior region of myocardium, no sig. ischemia demonstrated  . TONSILLECTOMY  ~ 1973  . TRANSTHORACIC ECHOCARDIOGRAM  05/13/2009   EF =>55%, mild mitral regurg, trace tricuspid regurg.        Home Medications    Prior to Admission medications   Medication Sig Start Date End Date Taking? Authorizing Provider  ALPRAZolam Prudy Feeler(XANAX) 0.5 MG tablet Take 0.5 mg by mouth as needed for anxiety.    [provider]  aspirin EC 81 MG tablet Take 81 mg by mouth at bedtime.    [provider]  atorvastatin (LIPITOR) 40 MG tablet Take 1 tablet (40 mg total) by mouth daily. 06/08/19   Marykay LexHarding, David W, MD  lisinopril (PRINIVIL,ZESTRIL) 20 MG tablet TAKE 1 TABLET (20 MG TOTAL) BY MOUTH DAILY. NEED OV. 09/01/18   Marykay LexHarding, David W, MD  omeprazole (PRILOSEC) 40 MG capsule Take 40 mg by mouth daily.    [provider]    Family History Family History  Problem Relation Age of Onset  . CAD Father 4062       CABG    Social History Social History   Tobacco Use  . Smoking status: Current Every Day Smoker    Packs/day: 0.25    Years: 30.00    Pack years: 7.50    Types: Cigarettes  . Smokeless tobacco: Never Used  Substance Use Topics  . Alcohol use: Yes    Alcohol/week: 4.0 standard drinks    Types: 4 Shots of liquor per week    Comment: 01/28/2013 "takes me a month to drink 1/5th"  . Drug use: No    Frequency: 7.0 times per week    Types: Marijuana, LSD    Comment: 01/28/2013 last drug use ">10 yr ago"; had reported marijuana use several times a week     Allergies   Patient has no known allergies.   Review of Systems Review of Systems  Cardiovascular: Positive for chest pain.  All other systems reviewed and are negative.    Physical Exam Updated Vital Signs BP (!) 131/95   Pulse (!) 134   Temp 98.2 F (36.8 C)  (Oral)   Resp 16   SpO2 92%   Physical Exam Vitals signs and nursing note reviewed.  Constitutional:      General: He is not in acute distress.    Appearance: He is well-developed.  HENT:     Head: Atraumatic.  Eyes:     Conjunctiva/sclera: Conjunctivae normal.  Neck:     Musculoskeletal: Neck supple.  Cardiovascular:     Rate and Rhythm: Tachycardia present.     Heart sounds: Normal heart sounds.  Pulmonary:     Effort: Pulmonary effort is normal.     Breath sounds: Normal breath sounds.  Chest:  Chest wall: No tenderness.  Abdominal:     Palpations: Abdomen is soft.     Tenderness: There is no abdominal tenderness.  Musculoskeletal:     Right lower leg: No edema.     Left lower leg: No edema.  Skin:    Capillary Refill: Capillary refill takes less than 2 seconds.     Findings: No rash.  Neurological:     Mental Status: He is alert and oriented to person, place, and time.  Psychiatric:        Mood and Affect: Mood normal.      ED Treatments / Results  Labs (all labs ordered are listed, but only abnormal results are displayed) Labs Reviewed  BASIC METABOLIC PANEL - Abnormal; Notable for the following components:      Result Value   Potassium 3.4 (*)    All other components within normal limits  SARS CORONAVIRUS 2 (TAT 6-24 HRS)  CBC  TSH  TROPONIN I (HIGH SENSITIVITY)  TROPONIN I (HIGH SENSITIVITY)    EKG None  ED ECG REPORT   Date: 08/05/2019  Rate: 126  Rhythm: atrial fibrillation  QRS Axis: normal  Intervals: QT prolonged  ST/T Wave abnormalities: nonspecific ST changes  Conduction Disutrbances:none  Narrative Interpretation:   Old EKG Reviewed: changes noted  I have personally reviewed the EKG tracing and agree with the computerized printout as noted.   Radiology Dg Chest Port 1 View  Result Date: 08/05/2019 CLINICAL DATA:  Onset chest pain and heart racing today. EXAM: PORTABLE CHEST 1 VIEW COMPARISON:  PA and lateral chest  09/09/2016. FINDINGS: The lungs are clear. Heart size is normal. No pneumothorax or pleural effusion. No acute or focal bony abnormality. IMPRESSION: Negative chest. Electronically Signed   By: Drusilla Kanner M.D.   On: 08/05/2019 15:26    Procedures .Critical Care Performed by: Fayrene Helper, PA-C Authorized by: Fayrene Helper, PA-C   Critical care provider statement:    Critical care time (minutes):  45   Critical care was time spent personally by me on the following activities:  Discussions with consultants, evaluation of patient's response to treatment, examination of patient, ordering and performing treatments and interventions, ordering and review of laboratory studies, ordering and review of radiographic studies, pulse oximetry, re-evaluation of patient's condition, obtaining history from patient or surrogate and review of old charts   (including critical care time)  Medications Ordered in ED Medications  metoprolol tartrate (LOPRESSOR) injection 5 mg (has no administration in time range)     Initial Impression / Assessment and Plan / ED Course  I have reviewed the triage vital signs and the nursing notes.  Pertinent labs & imaging results that were available during my care of the patient were reviewed by me and considered in my medical decision making (see chart for details).        BP (!) 131/95   Pulse (!) 134   Temp 98.2 F (36.8 C) (Oral)   Resp 16   SpO2 92%    Final Clinical Impressions(s) / ED Diagnoses   Final diagnoses:  New onset a-fib [facility])    ED Discharge Orders    None     2:50 PM Patient with significant cardiac history including drug-eluting stents who is here with concerning chest pain that has since resolved. Pt was initially found to be tachycardic however improves with IVF. Work-up initiated.  3:10 PM EKG showing atrial fibrillation, this is new.  His CHAD2VASC score is 2, which puts him at  moderate-high risk and would benefit  anticoagulation.    Will give  IV lopressor for rate control.  Plan for delta troponin.  If heart rate is controlled, pt may be able to be discharge home with anticoagulant, along with metoprolol and outpt f/u for new onset afib.  Suspect afib with RVR causing his discomfort.  Care discussed with Dr. Anitra Lauth.   3:55 PM Pt sign out to oncoming provider who will f/u on delta trop, recheck HR after administration of lopressor and reassess.  Since pt has had several similar episodes of "suspected afib w/ RVR", he is not a candidate for cardioversion.      Fayrene Helper, PA-C 08/05/19 1617    Gwyneth Sprout, MD 08/09/19 1929

## 2019-08-05 NOTE — Progress Notes (Signed)
ANTICOAGULATION CONSULT NOTE - Initial Consult  Pharmacy Consult for Xarelto Indication: atrial fibrillation  No Known Allergies  Patient Measurements: Weight: 182 lb (82.6 kg)  Vital Signs: Temp: 98.5 F (36.9 C) (10/28 1720) Temp Source: Oral (10/28 1720) BP: 117/77 (10/28 1715) Pulse Rate: 53 (10/28 1715)  Labs: Recent Labs    08/05/19 1446  HGB 15.1  HCT 45.8  PLT 206  CREATININE 1.05  TROPONINIHS 3    Estimated Creatinine Clearance: 80.4 mL/min (by C-G formula based on SCr of 1.05 mg/dL).   Medical History: Past Medical History:  Diagnosis Date  . Acid reflux   . Anxiety   . Arthritis   . CAD S/P percutaneous coronary angioplasty 12/10   RCA/CFX DES  . Chest pain 01/2013   Evaluate cardiac catheterization, no obstructive CAD  . Diverticulitis   . Dysrhythmia    OCC PALPITATION   . High cholesterol   . History of kidney stones   . Hypertension     Medications:  Scheduled:  . metoprolol tartrate  5 mg Intravenous Once  . rivaroxaban   Does not apply Once  . rivaroxaban  20 mg Oral Q supper   Infusions:   PRN:   Assessment: Pt is a 53 y/o male who presents to the [facility] ED with new onset A.fib with RVR. Patient will be started on Xarelto. Pharmacy has been consulted to dose Xarelto.  Hg/Hct is wnls at 15.1/45.8. Plts are wnls. Patient's CrCL is ~ 80 mL/min.   Goal of Therapy:  Monitor platelets by anticoagulation protocol: Yes   Plan:  Xarelto 20mg  PO daily Monitor for signs/symptoms of bleeding Monitor CBC periodically Educate patient on [place], PharmD PGY1 Acute Care Pharmacy Resident 08/05/2019,5:33 PM

## 2019-08-05 NOTE — ED Notes (Signed)
Pt verbalizes understanding of d/c instructions. Prescriptions reviewed with patient. Pt ambulatory at d/c with all belongings and with family.   

## 2019-08-05 NOTE — Discharge Instructions (Addendum)
I have prescribed metoprolol tartare 25 mg please take this medication twice a day for the next 30 days.   You will need to follow up with your Cardiologist Dr. Wilhemina Cash to go over your episode of Afib.  The second medication is Xarelto 20 mg, please take this as instructed.   Information on my medicine - XARELTO (Rivaroxaban)  This medication education was reviewed with me or my healthcare representative as part of my discharge preparation.  The pharmacist that spoke with me during my hospital stay was:  Werner Lean, [place]? Xarelto was prescribed for you to reduce the risk of a blood clot forming that can cause a stroke if you have a medical condition called atrial fibrillation (a type of irregular heartbeat).  WHAT DO YOU NEED TO KNOW ABOUT XARELTO ? Take your Xarelto ONCE DAILY at the same time every day with your evening meal. If you have difficulty swallowing the tablet whole, you may crush it and mix in applesauce just prior to taking your dose.  Take Xarelto exactly as prescribed by your doctor and DO NOT stop taking Xarelto without talking to the doctor who prescribed the medication.  Stopping without other stroke prevention medication to take the place of Xarelto may increase your risk of developing a clot that causes a stroke.  Refill your prescription before you run out.  After discharge, you should have regular check-up appointments with your healthcare provider that is prescribing your Xarelto.  In the future your dose may need to be changed if your kidney function or weight changes by a significant amount.  WHAT DO YOU DO IF YOU MISS A DOSE? If you are taking Xarelto ONCE DAILY and you miss a dose, take it as soon as you remember on the same day then continue your regularly scheduled once daily regimen the next day. Do not take two doses of Xarelto at the same time or on the same day.   IMPORTANT SAFETY INFORMATION A possible side  effect of Xarelto is bleeding. You should call your healthcare provider right away if you experience any of the following: Bleeding from an injury or your nose that does not stop. Unusual colored urine (red or dark brown) or unusual colored stools (red or black). Unusual bruising for unknown reasons. A serious fall or if you hit your head (even if there is no bleeding).  Some medicines may interact with Xarelto and might increase your risk of bleeding while on Xarelto. To help avoid this, consult your healthcare provider or pharmacist prior to using any new prescription or non-prescription medications, including herbals, vitamins, non-steroidal anti-inflammatory drugs (NSAIDs) and supplements.  This website has more information on Xarelto: [email or web address]

## 2019-08-05 NOTE — ED Triage Notes (Signed)
Pt arrives via ems after going to firestation for cp and heart racing  Pain radiating to left jaw and arm Heart rate 150 haas IV now pt given 400 fluid , HR better 113

## 2019-08-06 ENCOUNTER — Telehealth: Payer: Self-pay | Admitting: *Deleted

## 2019-08-06 NOTE — Telephone Encounter (Signed)
TOC CM received call from pt today about Xarelto Rx not at his pharmacy. Contacted pt and he is at work. Contacted wife and states [facility]. Contacted EDP, Janeece Fitting PA with orders. [facility]ed [facility] [address] [facility] 20 mg qd, 30 quantity. Faxed Xarelto 30 day free trial card to pharmacy. Updated wife. [place], [place] ED TOC CM [phone]

## 2019-08-07 ENCOUNTER — Other Ambulatory Visit: Payer: Self-pay | Admitting: Cardiology

## 2019-08-07 ENCOUNTER — Telehealth: Payer: Self-pay | Admitting: *Deleted

## 2019-08-07 MED ORDER — LISINOPRIL 20 MG PO TABS: 20.0000 mg | ORAL_TABLET | Freq: Every day | ORAL | 0 refills | Status: DC

## 2019-08-07 NOTE — Telephone Encounter (Signed)
° ° ° °*  STAT* If patient is at the pharmacy, call can be transferred to refill team.   1. Which medications need to be refilled? (please list name of each medication and dose if known) lisinopril 2. Which pharmacy/location (including street and city if local pharmacy) is medication to [facility] sent to[facility] [place]  3. Do they need a 30 day or 90 day supply? [place]

## 2019-08-07 NOTE — Telephone Encounter (Signed)
Requested Prescriptions   Signed Prescriptions Disp Refills  . lisinopril (ZESTRIL) 20 MG tablet 90 tablet 0    Sig: Take 1 tablet (20 mg total) by mouth daily. NEED OV.    Authorizing Provider: Leonie Man    Ordering User: Raelene Bott, Myiesha Edgar L

## 2019-08-07 NOTE — Telephone Encounter (Signed)
Left message for patient to keep appointment with afib clinic 08/10/19  At 3:30 pm - patient does not need another appointment with an extender at the [facility] office.

## 2019-08-10 ENCOUNTER — Other Ambulatory Visit: Payer: Self-pay

## 2019-08-10 ENCOUNTER — Ambulatory Visit (HOSPITAL_COMMUNITY)
Admission: RE | Admit: 2019-08-10 | Discharge: 2019-08-10 | Disposition: A | Discharge status: Home / Self Care | Payer: 59 | Source: Ambulatory Visit | Attending: Nurse Practitioner | Admitting: Nurse Practitioner

## 2019-08-10 ENCOUNTER — Encounter (HOSPITAL_COMMUNITY): Payer: Self-pay | Admitting: Nurse Practitioner

## 2019-08-10 VITALS — BP 184/96 | HR 45 | Ht 65.0 in | Wt 187.0 lb

## 2019-08-10 DIAGNOSIS — F17210 Nicotine dependence, cigarettes, uncomplicated: Secondary | ICD-10-CM | POA: Insufficient documentation

## 2019-08-10 DIAGNOSIS — Z87442 Personal history of urinary calculi: Secondary | ICD-10-CM | POA: Insufficient documentation

## 2019-08-10 DIAGNOSIS — Z7982 Long term (current) use of aspirin: Secondary | ICD-10-CM | POA: Insufficient documentation

## 2019-08-10 DIAGNOSIS — Z7901 Long term (current) use of anticoagulants: Secondary | ICD-10-CM | POA: Insufficient documentation

## 2019-08-10 DIAGNOSIS — Z8249 Family history of ischemic heart disease and other diseases of the circulatory system: Secondary | ICD-10-CM | POA: Insufficient documentation

## 2019-08-10 DIAGNOSIS — I4891 Unspecified atrial fibrillation: Secondary | ICD-10-CM | POA: Insufficient documentation

## 2019-08-10 DIAGNOSIS — I2510 Atherosclerotic heart disease of native coronary artery without angina pectoris: Secondary | ICD-10-CM | POA: Insufficient documentation

## 2019-08-10 DIAGNOSIS — I480 Paroxysmal atrial fibrillation: Secondary | ICD-10-CM

## 2019-08-10 DIAGNOSIS — Z79899 Other long term (current) drug therapy: Secondary | ICD-10-CM | POA: Insufficient documentation

## 2019-08-10 DIAGNOSIS — I10 Essential (primary) hypertension: Secondary | ICD-10-CM | POA: Diagnosis not present

## 2019-08-10 DIAGNOSIS — Z955 Presence of coronary angioplasty implant and graft: Secondary | ICD-10-CM | POA: Insufficient documentation

## 2019-08-10 MED ORDER — RIVAROXABAN 20 MG PO TABS: 20.0000 mg | ORAL_TABLET | Freq: Every day | ORAL | 3 refills | Status: DC

## 2019-08-10 MED ORDER — METOPROLOL TARTRATE 25 MG PO TABS: 12.5000 mg | ORAL_TABLET | Freq: Two times a day (BID) | ORAL | 0 refills | Status: DC

## 2019-08-10 NOTE — Progress Notes (Signed)
Primary Care Physician: Marva Panda, NP Referring Physician: [facility]  Cardiology: Dr. Osie Bond Eric Savage is a 53 y.o. male with a h/o CAD, HTN, tobacco abuse that is in the afib clinic after presenting to the ER with new onset afib while trying to install a toilet at work. V rates in the 150-170's. He spontaneously converted to SR. Dr. Herbie Baltimore was consulted.He was started on 25 mg metoprolol tartrate  bid and xarleto 20 mg daily and d/c home. He is taking  both of these meds. He is c/o of fatigue since starting the meds, His HR is in the 40's. No further afib.   He reports drinking a 1/5 of liquor weekly. Takes " swigs form the bottle, sometimes 7-8 in one night. Reports that he does not do this nightly. He does smoke. He snores loudly. He drinks moderate caffeine. He intermittently will have chest pressure, not always with exertional activity. This is not out of his usual chest pain history. BP elevated today at 184/96 and recheck is 178/96. He states that he eats a diet high in salt. He is taking his lisinopril daily, he is due another dose in about one hour.   Today, he denies symptoms of palpitations, chest pain, shortness of breath, orthopnea, PND, lower extremity edema, dizziness, presyncope, syncope, or neurologic sequela. The patient is tolerating medications without difficulties and is otherwise without complaint today.   Past Medical History:  Diagnosis Date  . Acid reflux   . Anxiety   . Arthritis   . CAD S/P percutaneous coronary angioplasty 12/10   RCA/CFX DES  . Chest pain 01/2013   Evaluate cardiac catheterization, no obstructive CAD  . Diverticulitis   . Dysrhythmia    OCC PALPITATION   . High cholesterol   . History of kidney stones   . Hypertension    Past Surgical History:  Procedure Laterality Date  . CHOLECYSTECTOMY N/A 08/04/2018   Procedure: LAPAROSCOPIC CHOLECYSTECTOMY;  Surgeon: Violeta Gelinas, MD;  Location: [facility] OR;  Service: General;   Laterality: N/A;  . COLONOSCOPY    . CORONARY ANGIOPLASTY WITH STENT PLACEMENT  09/2009   Proximal RCA (95%-60%) - Cypher DES 3.0 mm but 33 mm --> 3.75 mm; pCFX 80%: PCI - XIENCE V. DES 2.75 mm x 15 mm --> 3.0 mm  . LAPAROSCOPIC SIGMOID COLECTOMY N/A 06/05/2017   Procedure: SIGMOID COLECTOMY;  Surgeon: Violeta Gelinas, MD;  Location: [facility] OR;  Service: General;  Laterality: N/A;  . LEFT HEART CATHETERIZATION WITH CORONARY ANGIOGRAM N/A 05/19/2012   Procedure: LEFT HEART CATHETERIZATION WITH CORONARY ANGIOGRAM;  Surgeon: Marykay Lex, MD;  Location: [facility] CATH LAB;::  Cx & RCA stents patent. Moderate D1 & D2 as well as ostial AVG Cx.    Marland Kitchen LEFT HEART CATHETERIZATION WITH CORONARY ANGIOGRAM N/A 01/28/2013   Procedure: LEFT HEART CATHETERIZATION WITH CORONARY ANGIOGRAM;  Surgeon: Lennette Bihari, MD;  Location: [facility] CATH LAB;  Service: Cardiovascular:  No evidence for restenosis of either RCA or LCX. Mild LAD 20% narrowing and 60 - 70% smooth mid diagonal 2 stenosis.  Marland Kitchen NM MYOCAR PERF WALL MOTION  01/04/2011   Protocol:Bruce, post stress EF=69%, Exercise Cap 13 METS, attenuation defect in inferior region of myocardium, no sig. ischemia demonstrated  . TONSILLECTOMY  ~ 1973  . TRANSTHORACIC ECHOCARDIOGRAM  05/13/2009   EF =>55%, mild mitral regurg, trace tricuspid regurg.    Current Outpatient Medications  Medication Sig Dispense Refill  . ALPRAZolam (XANAX) 0.5 MG tablet  Take 0.5 mg by mouth as needed for anxiety.    Marland Kitchen aspirin EC 81 MG tablet Take 81 mg by mouth at bedtime.    Marland Kitchen atorvastatin (LIPITOR) 40 MG tablet Take 1 tablet (40 mg total) by mouth daily. 30 tablet 3  . lisinopril (ZESTRIL) 20 MG tablet Take 1 tablet (20 mg total) by mouth daily. NEED OV. 90 tablet 0  . metoprolol tartrate (LOPRESSOR) 25 MG tablet Take 0.5 tablets (12.5 mg total) by mouth 2 (two) times daily. 45 tablet 0  . NON FORMULARY SMARTSIG:2 Capsule(s) By Mouth Every 8 Hours    . omeprazole (PRILOSEC) 40 MG capsule Take 40 mg  by mouth daily.    . rivaroxaban (XARELTO) 20 MG TABS tablet Take 1 tablet (20 mg total) by mouth daily with supper. 30 tablet 3   No current facility-administered medications for this encounter.     No Known Allergies  Social History   Socioeconomic History  . Marital status: Single    Spouse name: Not on file  . Number of children: Not on file  . Years of education: Not on file  . Highest education level: Not on file  Occupational History  . Occupation: Unemployed  Social Needs  . Financial resource strain: Not on file  . Food insecurity    Worry: Not on file    Inability: Not on file  . Transportation needs    Medical: Not on file    Non-medical: Not on file  Tobacco Use  . Smoking status: Current Every Day Smoker    Packs/day: 0.50    Years: 30.00    Pack years: 15.00    Types: Cigarettes  . Smokeless tobacco: Never Used  Substance and Sexual Activity  . Alcohol use: Yes    Alcohol/week: 8.0 - 10.0 standard drinks    Types: 8 - 10 Shots of liquor per week    Comment: twice a week  . Drug use: No    Frequency: 7.0 times per week    Types: Marijuana, LSD    Comment: 01/28/2013 last drug use ">10 yr ago"; had reported marijuana use several times a week  . Sexual activity: Not Currently  Lifestyle  . Physical activity    Days per week: Not on file    Minutes per session: Not on file  . Stress: Not on file  Relationships  . Social Herbalist on phone: Not on file    Gets together: Not on file    Attends religious service: Not on file    Active member of club or organization: Not on file    Attends meetings of clubs or organizations: Not on file    Relationship status: Not on file  . Intimate partner violence    Fear of current or ex partner: Not on file    Emotionally abused: Not on file    Physically abused: Not on file    Forced sexual activity: Not on file  Other Topics Concern  . 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   Is active, but with a re-established spousal relationship, is eating a lot more "good - home cooked meals."    Family History  Problem Relation Age of Onset  . CAD Father 35       CABG    ROS- All  systems are reviewed and negative except as per the HPI above  Physical Exam: Vitals:   08/10/19 1533  BP: (!) 184/96  Pulse: (!) 45  Weight: 84.8 kg  Height: 5\' 5"  (1.651 m)   Wt Readings from Last 3 Encounters:  08/10/19 84.8 kg  08/05/19 82.6 kg  03/09/19 81.6 kg    Labs: Lab Results  Component Value Date   NA 142 08/05/2019   K 3.4 (L) 08/05/2019   CL 107 08/05/2019   CO2 26 08/05/2019   GLUCOSE 92 08/05/2019   BUN 10 08/05/2019   CREATININE 1.05 08/05/2019   CALCIUM 8.9 08/05/2019   MG 2.0 09/13/2016   Lab Results  Component Value Date   INR 1.17 09/09/2016   Lab Results  Component Value Date   CHOL 208 (H) 12/18/2016   HDL 40 (L) 12/18/2016   LDLCALC 149 (H) 12/18/2016   TRIG 95 12/18/2016     GEN- The patient is well appearing, alert and oriented x 3 today.   Head- normocephalic, atraumatic Eyes-  Sclera clear, conjunctiva pink Ears- hearing intact Oropharynx- clear Neck- supple, no JVP Lymph- no cervical lymphadenopathy Lungs- Clear to ausculation bilaterally, normal work of breathing Heart- Regular rate and rhythm, no murmurs, rubs or gallops, PMI not laterally displaced GI- soft, NT, ND, + BS Extremities- no clubbing, cyanosis, or edema MS- no significant deformity or atrophy Skin- no rash or lesion Psych- euthymic mood, full affect Neuro- strength and sensation are intact  EKG-Sinus  brady at 45 bpm, pr int 136 ms, qrs int 92 ms, qtc 418 ms    Assessment and Plan: 1. New onset afib General education re afib and triggers Continue metoprolol tartrate but at 12.5 mg bid for fatigue and bradycardia 2 week Zio patch  placed   2. Lifestyle issues Advised to not consume more than 2 alcoholic drinks a week Reduce  caffeine intake Tobacco cessation encouraged Sleep study ordered   3. HTN Elevated today Avoid salt Reduce  alcohol  Continue  lisinopril 20 mg daily   4. CHA2DS2VASc score of 2 General precautions re blood thinners Continue  xarelto 20 mg daily  Will refer back to Dr. Herbie BaltimoreHarding 's office in 1-2 weeks for ischemic w/u per the ER note  Consider echo as well as pt has not had one since 2010 afib clinic as needed  Lupita LeashDonna C. Matthew Folksarroll, ANP-C [facility] [address] [place], [place] [address] [phone]

## 2019-08-10 NOTE — Patient Instructions (Signed)
Decrease Metoprolol to 12.5mg  twice a day(cut 25mg  in half)

## 2019-08-13 ENCOUNTER — Telehealth: Payer: Self-pay | Admitting: *Deleted

## 2019-08-13 NOTE — Telephone Encounter (Signed)
PA submitted to [facility] via web portal for in lab sleep study. pending # [id number].

## 2019-08-17 ENCOUNTER — Ambulatory Visit (HOSPITAL_COMMUNITY): Payer: 59 | Admitting: Physician Assistant

## 2019-08-17 ENCOUNTER — Ambulatory Visit (HOSPITAL_COMMUNITY): Payer: 59 | Admitting: Nurse Practitioner

## 2019-08-17 ENCOUNTER — Other Ambulatory Visit: Payer: Self-pay | Admitting: Cardiology

## 2019-08-17 DIAGNOSIS — R0683 Snoring: Secondary | ICD-10-CM

## 2019-08-17 DIAGNOSIS — I480 Paroxysmal atrial fibrillation: Secondary | ICD-10-CM

## 2019-08-17 DIAGNOSIS — I10 Essential (primary) hypertension: Secondary | ICD-10-CM

## 2019-08-21 ENCOUNTER — Other Ambulatory Visit: Payer: Self-pay | Admitting: Cardiology

## 2019-08-21 ENCOUNTER — Telehealth: Payer: Self-pay | Admitting: Cardiology

## 2019-08-21 DIAGNOSIS — E669 Obesity, unspecified: Secondary | ICD-10-CM

## 2019-08-21 DIAGNOSIS — I10 Essential (primary) hypertension: Secondary | ICD-10-CM

## 2019-08-21 NOTE — Telephone Encounter (Signed)
Called patient to inform him [employer] denied him having a in lab sleep study. A HST has been scheduled. Patient states that he can't talk at the moment. He has OV on Monday. I will make a note for him to be notified while at [place].

## 2019-08-24 ENCOUNTER — Other Ambulatory Visit: Payer: Self-pay

## 2019-08-24 ENCOUNTER — Encounter: Payer: Self-pay | Admitting: Cardiology

## 2019-08-24 ENCOUNTER — Ambulatory Visit: Payer: 59 | Admitting: Cardiology

## 2019-08-24 VITALS — BP 138/84 | HR 56 | Temp 97.1°F | Ht 65.0 in | Wt 180.0 lb

## 2019-08-24 DIAGNOSIS — E785 Hyperlipidemia, unspecified: Secondary | ICD-10-CM | POA: Diagnosis not present

## 2019-08-24 DIAGNOSIS — I2510 Atherosclerotic heart disease of native coronary artery without angina pectoris: Secondary | ICD-10-CM | POA: Diagnosis not present

## 2019-08-24 DIAGNOSIS — I10 Essential (primary) hypertension: Secondary | ICD-10-CM

## 2019-08-24 DIAGNOSIS — Z716 Tobacco abuse counseling: Secondary | ICD-10-CM

## 2019-08-24 DIAGNOSIS — I480 Paroxysmal atrial fibrillation: Secondary | ICD-10-CM

## 2019-08-24 DIAGNOSIS — Z9861 Coronary angioplasty status: Secondary | ICD-10-CM | POA: Diagnosis not present

## 2019-08-24 LAB — COMPREHENSIVE METABOLIC PANEL
ALT: 20 IU/L (ref 0–44)
AST: 19 IU/L (ref 0–40)
Albumin/Globulin Ratio: 2 (ref 1.2–2.2)
Albumin: 4.6 g/dL (ref 3.8–4.9)
Alkaline Phosphatase: 119 IU/L — ABNORMAL HIGH (ref 39–117)
BUN/Creatinine Ratio: 11 (ref 9–20)
BUN: 11 mg/dL (ref 6–24)
Bilirubin Total: 0.5 mg/dL (ref 0.0–1.2)
CO2: 23 mmol/L (ref 20–29)
Calcium: 9.8 mg/dL (ref 8.7–10.2)
Chloride: 103 mmol/L (ref 96–106)
Creatinine, Ser: 0.97 mg/dL (ref 0.76–1.27)
GFR calc Af Amer: 103 mL/min/{1.73_m2} (ref >59)
GFR calc non Af Amer: 89 mL/min/{1.73_m2} (ref >59)
Globulin, Total: 2.3 g/dL (ref 1.5–4.5)
Glucose: 98 mg/dL (ref 65–99)
Potassium: 4.9 mmol/L (ref 3.5–5.2)
Sodium: 141 mmol/L (ref 134–144)
Total Protein: 6.9 g/dL (ref 6.0–8.5)

## 2019-08-24 LAB — LIPID PANEL
Chol/HDL Ratio: 2.7 ratio (ref 0.0–5.0)
Cholesterol, Total: 103 mg/dL (ref 100–199)
HDL: 38 mg/dL — ABNORMAL LOW (ref >39)
LDL Chol Calc (NIH): 47 mg/dL (ref 0–99)
Triglycerides: 92 mg/dL (ref 0–149)
VLDL Cholesterol Cal: 18 mg/dL (ref 5–40)

## 2019-08-24 NOTE — Progress Notes (Signed)
Primary Care Provider: Marva Panda, NP Cardiologist: [name], MD Electrophysiologist:   Clinic Note: Chief Complaint  Patient presents with  . Follow-up  . Atrial Fibrillation    New diagnosis   HPI:    Eric Savage is a 53 y.o. male with prior h/o CAD-PCI (2010 PCI RCA & Cx) & now recent New Dx of PAF who presents today for close f/u from [facility].  Eric Savage was last seen in June via Telemedicine --> he was doing relatively well, few episodes of short-lived chest pain off and on.  Going on for several months.  Occurring with both rest and exertion.  Also noted occasional palpitations lasting just about that long.  Short-lived, but may be 3 to 5 minutes. -> No changes  Recent Hospitalizations:   August 05, 2019 ER: Presented to the ER via EMS with new onset A. fib.  Noted pain along his chest while he was trying to install a toilet at work.  Heart rate noted to be in the 1 50-1 70 range.  He was given IV fluids and essentially spontaneously converted to sinus rhythm.  He did indicate that he been feeling episodes similar to this off and on for the past several weeks.  Normal delta troponin.  EDP discussed the case with me, and since he had converted, we decided to start him on low-dose Lopressor along with Xarelto.  Plan was to follow-up in the A. fib clinic with hopes that he would have ischemic evaluation ordered prior to this visit.   Seen in A. fib clinic by Rudi Coco, NP on November 2. ->  Noted fatigue with 25 mg twice daily metoprolol along with heart rate in the 40s.  Was reduced to 12.5 mg twice daily.  Continued on Xarelto.  Event monitor ordered.  Documented pretty significant alcohol intake  Also noted that he has previously well controlled diet was not as well controlled.  No medication changes made besides reducing metoprolol and nothing ordered.  Reviewed  CV studies:    The following studies were reviewed today: (if  available, images/films reviewed: From Epic Chart or Care Everywhere) . Event Monitor - just turned in, not ready to read yet.   Interval History:   Eric Savage is here today for his primary cardiology follow-up indicating that he has not had any prolonged spells like the A. fib spell it took him to the hospital he did say that this past Friday he was chasing a peeping Elijah Birk was looking into his grandchildren's rooms and was quite upset.  He had about 5 episodes where he felt his heart rate going fast for about a minute or so.  And then he had 1 over the weekend there is less pronounced.  He still does note that he has the intermittent chest pain issues that he has been having for long.  He did only feel the discomfort in his chest when his heart rate was going fast enough that he went to the ER.  He felt short of breath with it, but unless the symptoms are long like the one to take him to the ER he is not having any chest discomfort with it.  He gets a little dizzy and short of breath but only that one episode of chest pain. -> As was the case on Friday, these episodes are oftentimes triggered by anger or stress, etc. anxiety.  Can also be triggered by caffeine and feeling tired.  When not having  the palpitations, pretty much asymptomatic.  He has a outpatient home sleep study ordered.  He has noted that his wife tells him that he has these gagging spells and apnea spells at night as well as snoring pretty significantly.  He has had some mild urologic issues with some hematuria thought to be related to nephrolithiasis.  Should be going to see urology soon.  Otherwise he has not been having any bleeding with the Xarelto.  CV Review of Symptoms (Summary) Cardiovascular ROS: positive for - chest pain, palpitations, rapid heart rate and Very sporadic symptoms.  See above negative for - chest pain, edema, orthopnea, paroxysmal nocturnal dyspnea, shortness of breath or Syncope/near syncope,  TIA/amaurosis fugax  The patient does not have symptoms concerning for COVID-19 infection (fever, chills, cough, or new shortness of breath).  The patient is practicing social distancing. ++ Masking.  He routinely will go out for groceries/shopping.  Still very busy at work, but a lot of this is outside and relatively easy to do social distancing.  When he is inside and in places where people tend to congregate, he would wear a mask and occasionally gloves.  *He continues to smoke.  Despite the fact that he knows he needs to quit, he has not been able do that.   REVIEWED OF SYSTEMS   A comprehensive ROS was performed.  Pertinent symptoms noted in HPI Review of Systems  Constitutional: Negative for malaise/fatigue and weight loss.  HENT: Negative for congestion and nosebleeds.   Respiratory: Positive for cough (Sometimes in the morning). Negative for shortness of breath (If he overdoes it).   Cardiovascular: Negative for leg swelling.  Gastrointestinal: Negative for blood in stool, diarrhea, heartburn and melena.  Genitourinary: Positive for dysuria and hematuria (Noted in HPI).  Musculoskeletal: Positive for back pain. Negative for falls and joint pain.  Neurological: Negative for dizziness and focal weakness.  Endo/Heme/Allergies: Negative for environmental allergies. Does not bruise/bleed easily.  Psychiatric/Behavioral: The patient is nervous/anxious.        He has issues of anxiety that he oftentimes will take as needed Xanax.  Sometimes he also will drink a little more alcohol than usual to relax.  He does not mix the 2.  He indicates that he is quite able to go several days if not a week or 2 without alcohol.  All other systems reviewed and are negative.  I have reviewed and (if needed) personally updated the patient's problem list, medications, allergies, past medical and surgical history, social and family history.   PAST MEDICAL HISTORY   Past Medical History:  Diagnosis Date   . Acid reflux   . Anxiety   . Arthritis   . CAD S/P percutaneous coronary angioplasty 12/10   RCA/CFX DES  . Chest pain 01/2013   Evaluate cardiac catheterization, no obstructive CAD  . Diverticulitis   . Dysrhythmia    OCC PALPITATION   . High cholesterol   . History of kidney stones   . Hypertension     PAST SURGICAL HISTORY   Past Surgical History:  Procedure Laterality Date  . CHOLECYSTECTOMY N/A 08/04/2018   Procedure: LAPAROSCOPIC CHOLECYSTECTOMY;  Surgeon: Georganna Skeans, MD;  Location: [facility];  Service: General;  Laterality: N/A;  . COLONOSCOPY    . CORONARY ANGIOPLASTY WITH STENT PLACEMENT  09/2009   Proximal RCA (95%-60%) - Cypher DES 3.0 mm but 33 mm --> 3.75 mm; pCFX 80%: PCI - XIENCE V. DES 2.75 mm x 15 mm --> 3.0 mm  .  LAPAROSCOPIC SIGMOID COLECTOMY N/A 06/05/2017   Procedure: SIGMOID COLECTOMY;  Surgeon: Violeta Gelinashompson, Burke, MD;  [facility];  Service: General;  Laterality: N/A;  . LEFT HEART CATHETERIZATION WITH CORONARY ANGIOGRAM N/A 05/19/2012   Procedure: LEFT HEART CATHETERIZATION WITH CORONARY ANGIOGRAM;  Surgeon: Marykay Lexavid W Lliam Hoh, MD;  [facility];::  Cx & RCA stents patent. Moderate D1 & D2 as well as ostial AVG Cx.    Marland Kitchen. LEFT HEART CATHETERIZATION WITH CORONARY ANGIOGRAM N/A 01/28/2013   Procedure: LEFT HEART CATHETERIZATION WITH CORONARY ANGIOGRAM;  Surgeon: Lennette Biharihomas A Kelly, MD;  [facility];  Service: Cardiovascular:  No evidence for restenosis of either RCA or LCX. Mild LAD 20% narrowing and 60 - 70% smooth mid diagonal 2 stenosis.  Marland Kitchen. NM MYOCAR PERF WALL MOTION  01/04/2011   Protocol:Bruce, post stress EF=69%, Exercise Cap 13 METS, attenuation defect in inferior region of myocardium, no sig. ischemia demonstrated  . TONSILLECTOMY  ~ 1973  . TRANSTHORACIC ECHOCARDIOGRAM  05/13/2009   EF =>55%, mild mitral regurg, trace tricuspid regurg.    MEDICATIONS/ALLERGIES   Current Meds  Medication Sig  . ALPRAZolam (XANAX) 0.5 MG tablet Take 0.5  mg by mouth as needed for anxiety.  Marland Kitchen. aspirin EC 81 MG tablet Take 81 mg by mouth at bedtime.  Marland Kitchen. atorvastatin (LIPITOR) 40 MG tablet Take 1 tablet (40 mg total) by mouth daily.  Marland Kitchen. lisinopril (ZESTRIL) 20 MG tablet Take 1 tablet (20 mg total) by mouth daily. NEED OV.  . metoprolol tartrate (LOPRESSOR) 25 MG tablet Take 0.5 tablets (12.5 mg total) by mouth 2 (two) times daily.  . NON FORMULARY SMARTSIG:2 Capsule(s) By Mouth Every 8 Hours  . omeprazole (PRILOSEC) 40 MG capsule Take 40 mg by mouth daily.  . rivaroxaban (XARELTO) 20 MG TABS tablet Take 1 tablet (20 mg total) by mouth daily with supper.    No Known Allergies   SOCIAL HISTORY/FAMILY HISTORY   Social History   Tobacco Use  . Smoking status: Current Every Day Smoker    Packs/day: 0.50    Years: 30.00    Pack years: 15.00    Types: Cigarettes  . Smokeless tobacco: Never Used  Substance Use Topics  . Alcohol use: Yes    Alcohol/week: 8.0 - 10.0 standard drinks    Types: 8 - 10 Shots of liquor per week    Comment: twice a week  . 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   Is active, but with a re-established spousal relationship, is eating a lot more "good - home cooked meals. -->  Often high in salt.      He reports drinking a 1/5 of liquor weekly. Takes " swigs form the bottle, sometimes 7-8 in one night. Reports that he does not do this nightly. He does smoke. He snores loudly. He drinks moderate caffeine    Family History family history includes CAD (age of onset: 6862) in his father.   OBJCTIVE -PE, EKG, labs   Wt Readings from Last 3 Encounters:  08/24/19 180 lb (81.6 kg)  08/10/19 187 lb (84.8 kg)   08/05/19 182 lb (  82.6 kg)    Physical Exam: BP 138/84   Pulse (!) 56   Temp (!) 97.1 F (36.2 C)   Ht  (1.651 m)   Wt 180 lb (81.6 kg)   SpO2 99%   BMI 29.95 kg/m  Physical Exam  Constitutional: He is oriented to person, place, and time. He appears well-developed and well-nourished.  Well-groomed.  No acute distress.  Healthy-appearing  HENT:  Head: Normocephalic and atraumatic.  Neck: Normal range of motion. Neck supple. No JVD present.  Cardiovascular: Normal rate, regular rhythm, normal heart sounds and intact distal pulses. Exam reveals no gallop and no friction rub.  No murmur heard. Pulmonary/Chest: Effort normal and breath sounds normal. No respiratory distress. He has no wheezes. He has no rales.  Abdominal: Soft. Bowel sounds are normal. He exhibits no distension. There is no abdominal tenderness. There is no rebound.  Musculoskeletal: Normal range of motion.        General: No edema.  Neurological: He is alert and oriented to person, place, and time. No cranial nerve deficit.  Psychiatric: He has a normal mood and affect. His behavior is normal. Judgment and thought content normal.  Vitals reviewed.    Adult ECG Report  Rate: 56 ;  Rhythm: sinus bradycardia and Normal axis, intervals and durations.;   Narrative Interpretation: Relatively normal  Recent Labs:    Lab Results  Component Value Date   CHOL 103 08/24/2019   HDL 38 (L) 08/24/2019   LDLCALC 47 08/24/2019   TRIG 92 08/24/2019   CHOLHDL 2.7 08/24/2019   Lab Results  Component Value Date   CREATININE 0.97 08/24/2019   BUN 11 08/24/2019   NA 141 08/24/2019   K 4.9 08/24/2019   CL 103 08/24/2019   CO2 23 08/24/2019    ASSESSMENT/PLAN    Problem List Items Addressed This Visit    CAD, RCA/CFX DES 12/10 with residual 50-60% LAD.- Cath 05/19/12- and 01/28/13 no ISR- med Rx (Chronic)    History of PCI to the RCA in LCx in 2010 with moderate residual LAD disease and no recurrent angina since  PCI. He is on a pretty stable regimen with statin and ACE inhibitor.  Not on aspirin now because of Xarelto. Only able to tolerate low-dose beta-blocker.  He still has intermittent episodes of chest discomfort which are relatively fleeting and do not sound cardiac in nature, however now with new diagnosis of A. fib leading to ischemia assessment.  Plan: Lexiscan Myoview Continue current meds.  Again no beta-blocker because of bradycardia, and no aspirin/Plavix because of Xarelto.      Relevant Orders   EKG 12-Lead   Comprehensive metabolic panel (Completed)   LEXISCAN---MYOCARDIAL PERFUSION IMAGING   Tobacco abuse counseling (Chronic)    He has cut down, but not yet ready to quit.  Borderline precontemplative stage.      PAF (paroxysmal atrial fibrillation) (HCC); CHA2DS2-VASc Score 2 - Primary (Chronic)    New diagnosis of A. fib in a patient with prior history of CAD.  Prolonged episode of A. fib associated with some chest discomfort and dyspnea. Currently in sinus rhythm sinus bradycardia with low-dose Lopressor for rate control. Started on Xarelto for anticoagulation This patients CHA2DS2-VASc Score and unadjusted Ischemic Stroke Rate (% per year) is equal to 2.2 % stroke rate/year from a score of 2  Above score calculated as 1 point each if present [CHF, HTN, DM, Vascular=MI/PAD/Aortic Plaque, Age if 65-74, or Male]; 2 points each if present [Age >  75, or Stroke/TIA/TE]  Need to exclude an ischemic etiology.  Plan:  Await results of event monitor to evaluate A. fib log.  Would like to see what was happening this past Friday.  Check Lexiscan Myoview to exclude ischemic etiology.  Continue Xarelto but no AV nodal agent because of bradycardia.      Relevant Orders   EKG 12-Lead   Comprehensive metabolic panel (Completed)   LEXISCAN---MYOCARDIAL PERFUSION IMAGING   Essential hypertension (Chronic)    Borderline blood pressure today.  Low threshold to consider additional  blood pressure control and follow-up.      Dyslipidemia, goal LDL below 70 (Chronic)    Has been a while since he had labs checked.  He has not eaten today so we will order labs for the.  Continue current dose of atorvastatin.  --> Labs returned at the end of the day show LDL of 47.  Well-controlled on current dose of statin.  No changes.      Relevant Orders   Lipid panel (Completed)       COVID-19 Education: The signs and symptoms of COVID-19 were discussed with the patient and how to seek care for testing (follow up with PCP or arrange E-visit).   The importance of social distancing was discussed today.  I spent a total of with the patient and chart review. >  50% of the time was spent in direct patient consultation.  Additional time spent with chart review (studies, outside notes, etc): 10 Total Time: 30 min   Current medicines are reviewed at length with the patient today.  (+/- concerns) n/a   Patient Instructions / Medication Changes & Studies & Tests Ordered   Patient Instructions  Medication Instructions:  No CHANGES  *If you need a refill on your cardiac medications before your next appointment, please call your pharmacy*  Lab Work: cmp lipid If you have labs (blood work) drawn today and your tests are completely normal, you will receive your results only by: Marland Kitchen MyChart Message (if you have MyChart) OR . A paper copy in the mail If you have any lab test that is abnormal or we need to change your treatment, we will call you to review the results.  Testing/Procedures: Will be schedule at [address] Your physician has requested that you have a lexiscan myoview. For further information please visit [email or web address] Please follow instruction sheet, as given.   Follow-Up: At [facility], you and your health needs are our priority.  As part of our continuing mission to provide you with exceptional heart care, we have created  designated Provider Care Teams.  These Care Teams include your primary Cardiologist (physician) and Advanced Practice Providers (APPs -  Physician Assistants and Nurse Practitioners) who all work together to provide you with the care you need, when you need it.  Your next appointment:   3 months  The format for your next appointment:   In Person  Provider:   [name], MD  Other Instructions   TALK TO YOUR PRIMARY DOCTOR/NP ABOUT MEDICATION FOR ANXIETY     Studies Ordered:   Orders Placed This Encounter  Procedures  . Lipid panel  . Comprehensive metabolic panel  . LEXISCAN---MYOCARDIAL PERFUSION IMAGING  . EKG 12-Lead     [name], M.D., M.S. Interventional Cardiologist   Pager # [phone] Phone # [phone] [address] [place], [place] [address]   Thank you for choosing Heartcare at [facility]!!

## 2019-08-24 NOTE — Patient Instructions (Signed)
Medication Instructions:  No CHANGES  *If you need a refill on your cardiac medications before your next appointment, please call your pharmacy*  Lab Work: cmp lipid If you have labs (blood work) drawn today and your tests are completely normal, you will receive your results only by: Marland Kitchen MyChart Message (if you have MyChart) OR . A paper copy in the mail If you have any lab test that is abnormal or we need to change your treatment, we will call you to review the results.  Testing/Procedures: Will be schedule at [facility] has requested that you have a lexiscan myoview. For further information please visit [email or web address]. Please follow instruction sheet, as given.   Follow-Up: At [facility], you and your health needs are our priority.  As part of our continuing mission to provide you with exceptional heart care, we have created designated Provider Care Teams.  These Care Teams include your primary Cardiologist (physician) and Advanced Practice Providers (APPs -  Physician Assistants and Nurse Practitioners) who all work together to provide you with the care you need, when you need it.  Your next appointment:   3 months  The format for your next appointment:   In Person  Provider:   Glenetta Hew, MD  Other Instructions   TALK TO YOUR PRIMARY DOCTOR/NP [place]

## 2019-08-25 ENCOUNTER — Other Ambulatory Visit: Payer: Self-pay | Admitting: Urology

## 2019-08-25 ENCOUNTER — Telehealth (HOSPITAL_COMMUNITY): Payer: Self-pay

## 2019-08-25 ENCOUNTER — Encounter: Payer: Self-pay | Admitting: Cardiology

## 2019-08-25 DIAGNOSIS — N200 Calculus of kidney: Secondary | ICD-10-CM

## 2019-08-25 NOTE — Assessment & Plan Note (Addendum)
New diagnosis of A. fib in a patient with prior history of CAD.  Prolonged episode of A. fib associated with some chest discomfort and dyspnea. Currently in sinus rhythm sinus bradycardia with low-dose Lopressor for rate control. Started on Xarelto for anticoagulation This patients CHA2DS2-VASc Score and unadjusted Ischemic Stroke Rate (% per year) is equal to 2.2 % stroke rate/year from a score of 2  Above score calculated as 1 point each if present [CHF, HTN, DM, Vascular=MI/PAD/Aortic Plaque, Age if 65-74, or Male]; 2 points each if present [Age > 75, or Stroke/TIA/TE]  Need to exclude an ischemic etiology.  Plan:  Await results of event monitor to evaluate A. fib log.  Would like to see what was happening this past Friday.  Check Lexiscan Myoview to exclude ischemic etiology.  Continue Xarelto but no AV nodal agent because of bradycardia.

## 2019-08-25 NOTE — Telephone Encounter (Signed)
Encounter complete. 

## 2019-08-25 NOTE — Assessment & Plan Note (Signed)
Borderline blood pressure today.  Low threshold to consider additional blood pressure control and follow-up.

## 2019-08-25 NOTE — Assessment & Plan Note (Signed)
History of PCI to the RCA in LCx in 2010 with moderate residual LAD disease and no recurrent angina since PCI. He is on a pretty stable regimen with statin and ACE inhibitor.  Not on aspirin now because of Xarelto. Only able to tolerate low-dose beta-blocker.  He still has intermittent episodes of chest discomfort which are relatively fleeting and do not sound cardiac in nature, however now with new diagnosis of A. fib leading to ischemia assessment.  Plan: Lexiscan Myoview Continue current meds.  Again no beta-blocker because of bradycardia, and no aspirin/Plavix because of Xarelto.

## 2019-08-25 NOTE — Assessment & Plan Note (Signed)
Has been a while since he had labs checked.  He has not eaten today so we will order labs for the.  Continue current dose of atorvastatin.  --> Labs returned at the end of the day show LDL of 47.  Well-controlled on current dose of statin.  No changes.

## 2019-08-25 NOTE — Assessment & Plan Note (Signed)
He has cut down, but not yet ready to quit.  Borderline precontemplative stage.

## 2019-08-26 ENCOUNTER — Telehealth (HOSPITAL_COMMUNITY): Payer: Self-pay | Admitting: *Deleted

## 2019-08-26 NOTE — Telephone Encounter (Signed)
Close encounter 

## 2019-08-28 ENCOUNTER — Other Ambulatory Visit: Payer: Self-pay

## 2019-08-28 ENCOUNTER — Ambulatory Visit (HOSPITAL_COMMUNITY)
Admission: RE | Admit: 2019-08-28 | Discharge: 2019-08-28 | Disposition: A | Discharge status: Home / Self Care | Payer: 59 | Source: Ambulatory Visit | Attending: Cardiology | Admitting: Cardiology

## 2019-08-28 DIAGNOSIS — I2510 Atherosclerotic heart disease of native coronary artery without angina pectoris: Secondary | ICD-10-CM | POA: Diagnosis present

## 2019-08-28 DIAGNOSIS — Z9861 Coronary angioplasty status: Secondary | ICD-10-CM

## 2019-08-28 DIAGNOSIS — I480 Paroxysmal atrial fibrillation: Secondary | ICD-10-CM | POA: Insufficient documentation

## 2019-08-28 LAB — MYOCARDIAL PERFUSION IMAGING
LV dias vol: 114 mL (ref 62–150)
LV sys vol: 50 mL
Peak HR: 75 {beats}/min
Rest HR: 54 {beats}/min
SDS: 1
SRS: 2
SSS: 3
TID: 1.23

## 2019-08-28 MED ORDER — REGADENOSON 0.4 MG/5ML IV SOLN
0.4000 mg | Freq: Once | INTRAVENOUS | Status: AC
  Administered 2019-08-28: 0.4 mg via INTRAVENOUS

## 2019-08-28 MED ORDER — TECHNETIUM TC 99M TETROFOSMIN IV KIT
10.9000 | PACK | Freq: Once | INTRAVENOUS | Status: AC | PRN
  Administered 2019-08-28: 10.9 via INTRAVENOUS
  Filled 2019-08-28: qty 11

## 2019-08-28 MED ORDER — TECHNETIUM TC 99M TETROFOSMIN IV KIT
32.1000 | PACK | Freq: Once | INTRAVENOUS | Status: AC | PRN
  Administered 2019-08-28: 32.1 via INTRAVENOUS
  Filled 2019-08-28: qty 33

## 2019-09-07 ENCOUNTER — Ambulatory Visit
Admission: RE | Admit: 2019-09-07 | Discharge: 2019-09-07 | Disposition: A | Discharge status: Home / Self Care | Payer: 59 | Source: Ambulatory Visit | Attending: Urology | Admitting: Urology

## 2019-09-07 DIAGNOSIS — N200 Calculus of kidney: Secondary | ICD-10-CM

## 2019-09-09 ENCOUNTER — Encounter (HOSPITAL_COMMUNITY): Payer: Self-pay | Admitting: *Deleted

## 2019-09-16 ENCOUNTER — Other Ambulatory Visit (HOSPITAL_COMMUNITY): Payer: Self-pay | Admitting: *Deleted

## 2019-09-16 ENCOUNTER — Other Ambulatory Visit: Payer: Self-pay | Admitting: Cardiology

## 2019-09-16 MED ORDER — METOPROLOL TARTRATE 25 MG PO TABS: 12.5000 mg | ORAL_TABLET | Freq: Two times a day (BID) | ORAL | 2 refills | Status: DC

## 2019-09-16 MED ORDER — RIVAROXABAN 20 MG PO TABS: 20.0000 mg | ORAL_TABLET | Freq: Every day | ORAL | 6 refills | Status: DC

## 2019-10-12 ENCOUNTER — Other Ambulatory Visit: Payer: Self-pay | Admitting: Cardiology

## 2019-10-19 ENCOUNTER — Ambulatory Visit (HOSPITAL_BASED_OUTPATIENT_CLINIC_OR_DEPARTMENT_OTHER): Payer: 59 | Attending: Cardiology | Admitting: Cardiovascular Disease

## 2019-11-03 ENCOUNTER — Encounter (HOSPITAL_COMMUNITY): Payer: Self-pay | Admitting: *Deleted

## 2019-11-03 ENCOUNTER — Emergency Department (HOSPITAL_COMMUNITY)
Admission: EM | Admit: 2019-11-03 | Discharge: 2019-11-03 | Disposition: A | Discharge status: Home / Self Care | Payer: 59 | Attending: Emergency Medicine | Admitting: Emergency Medicine

## 2019-11-03 ENCOUNTER — Emergency Department (HOSPITAL_COMMUNITY): Payer: 59

## 2019-11-03 ENCOUNTER — Other Ambulatory Visit: Payer: Self-pay

## 2019-11-03 DIAGNOSIS — Z7901 Long term (current) use of anticoagulants: Secondary | ICD-10-CM | POA: Diagnosis not present

## 2019-11-03 DIAGNOSIS — F17210 Nicotine dependence, cigarettes, uncomplicated: Secondary | ICD-10-CM | POA: Insufficient documentation

## 2019-11-03 DIAGNOSIS — I4891 Unspecified atrial fibrillation: Secondary | ICD-10-CM | POA: Diagnosis not present

## 2019-11-03 DIAGNOSIS — N3001 Acute cystitis with hematuria: Secondary | ICD-10-CM | POA: Diagnosis not present

## 2019-11-03 DIAGNOSIS — Z79899 Other long term (current) drug therapy: Secondary | ICD-10-CM | POA: Insufficient documentation

## 2019-11-03 DIAGNOSIS — I10 Essential (primary) hypertension: Secondary | ICD-10-CM | POA: Diagnosis not present

## 2019-11-03 DIAGNOSIS — Z20822 Contact with and (suspected) exposure to covid-19: Secondary | ICD-10-CM | POA: Insufficient documentation

## 2019-11-03 DIAGNOSIS — R079 Chest pain, unspecified: Secondary | ICD-10-CM | POA: Diagnosis present

## 2019-11-03 DIAGNOSIS — I480 Paroxysmal atrial fibrillation: Secondary | ICD-10-CM | POA: Insufficient documentation

## 2019-11-03 LAB — URINALYSIS, ROUTINE W REFLEX MICROSCOPIC
Bilirubin Urine: NEGATIVE
Glucose, UA: NEGATIVE mg/dL
Ketones, ur: NEGATIVE mg/dL
Leukocytes,Ua: NEGATIVE
Nitrite: NEGATIVE
Protein, ur: NEGATIVE mg/dL
Specific Gravity, Urine: 1.005 (ref 1.005–1.030)
pH: 6 (ref 5.0–8.0)

## 2019-11-03 LAB — CBC
HCT: 49.2 % (ref 39.0–52.0)
Hemoglobin: 16.6 g/dL (ref 13.0–17.0)
MCH: 31.7 pg (ref 26.0–34.0)
MCHC: 33.7 g/dL (ref 30.0–36.0)
MCV: 93.9 fL (ref 80.0–100.0)
Platelets: 222 10*3/uL (ref 150–400)
RBC: 5.24 MIL/uL (ref 4.22–5.81)
RDW: 12.4 % (ref 11.5–15.5)
WBC: 14.8 10*3/uL — ABNORMAL HIGH (ref 4.0–10.5)
nRBC: 0 % (ref 0.0–0.2)

## 2019-11-03 LAB — BASIC METABOLIC PANEL
Anion gap: 8 (ref 5–15)
BUN: 15 mg/dL (ref 6–20)
CO2: 27 mmol/L (ref 22–32)
Calcium: 9.2 mg/dL (ref 8.9–10.3)
Chloride: 105 mmol/L (ref 98–111)
Creatinine, Ser: 1.08 mg/dL (ref 0.61–1.24)
GFR calc Af Amer: >60 mL/min (ref >60)
GFR calc non Af Amer: >60 mL/min (ref >60)
Glucose, Bld: 124 mg/dL — ABNORMAL HIGH (ref 70–99)
Potassium: 4 mmol/L (ref 3.5–5.1)
Sodium: 140 mmol/L (ref 135–145)

## 2019-11-03 LAB — HEPATIC FUNCTION PANEL
ALT: 19 U/L (ref 0–44)
AST: 15 U/L (ref 15–41)
Albumin: 4.4 g/dL (ref 3.5–5.0)
Alkaline Phosphatase: 89 U/L (ref 38–126)
Bilirubin, Direct: 0.1 mg/dL (ref 0.0–0.2)
Indirect Bilirubin: 0.9 mg/dL (ref 0.3–0.9)
Total Bilirubin: 1 mg/dL (ref 0.3–1.2)
Total Protein: 7.1 g/dL (ref 6.5–8.1)

## 2019-11-03 LAB — RESPIRATORY PANEL BY RT PCR (FLU A&B, COVID)
Influenza A by PCR: NEGATIVE
Influenza B by PCR: NEGATIVE
SARS Coronavirus 2 by RT PCR: NEGATIVE

## 2019-11-03 LAB — TROPONIN I (HIGH SENSITIVITY)
Troponin I (High Sensitivity): <2 ng/L (ref <18)
Troponin I (High Sensitivity): <2 ng/L (ref <18)

## 2019-11-03 MED ORDER — HYDROMORPHONE HCL 1 MG/ML IJ SOLN
1.0000 mg | Freq: Once | INTRAMUSCULAR | Status: AC
  Administered 2019-11-03: 1 mg via INTRAVENOUS
  Filled 2019-11-03: qty 1

## 2019-11-03 MED ORDER — CEPHALEXIN 500 MG PO CAPS: 500.0000 mg | ORAL_CAPSULE | Freq: Four times a day (QID) | ORAL | 0 refills | Status: DC

## 2019-11-03 MED ORDER — ONDANSETRON HCL 4 MG/2ML IJ SOLN
4.0000 mg | Freq: Once | INTRAMUSCULAR | Status: AC
  Administered 2019-11-03: 4 mg via INTRAVENOUS
  Filled 2019-11-03: qty 2

## 2019-11-03 MED ORDER — OXYCODONE-ACETAMINOPHEN 5-325 MG PO TABS: 1.0000 | ORAL_TABLET | Freq: Four times a day (QID) | ORAL | 0 refills | Status: DC | PRN

## 2019-11-03 MED ORDER — DILTIAZEM HCL 25 MG/5ML IV SOLN
10.0000 mg | Freq: Once | INTRAVENOUS | Status: AC
  Administered 2019-11-03: 10 mg via INTRAVENOUS
  Filled 2019-11-03: qty 5

## 2019-11-03 MED ORDER — DILTIAZEM HCL-DEXTROSE 125-5 MG/125ML-% IV SOLN (PREMIX): 5.0000 mg/h | INTRAVENOUS | Status: DC

## 2019-11-03 MED ORDER — HYDROCODONE-ACETAMINOPHEN 5-325 MG PO TABS: 1.0000 | ORAL_TABLET | Freq: Four times a day (QID) | ORAL | 0 refills | Status: DC | PRN

## 2019-11-03 MED ORDER — DILTIAZEM HCL-DEXTROSE 125-5 MG/125ML-% IV SOLN (PREMIX)
INTRAVENOUS | Status: AC
  Administered 2019-11-03: 5 mg via INTRAVENOUS
  Filled 2019-11-03: qty 125

## 2019-11-03 MED ORDER — SODIUM CHLORIDE 0.9% FLUSH: 3.0000 mL | Freq: Once | INTRAVENOUS | Status: DC

## 2019-11-03 NOTE — Discharge Instructions (Addendum)
Follow-up with the cardiologist tomorrow.  Call them in the morning if they have not contacted you about an appointment.  Follow-up with your urologist next week to recheck urinary tract infection

## 2019-11-03 NOTE — ED Triage Notes (Signed)
Onset of chest pain and SHOB this am. History of A fib.

## 2019-11-03 NOTE — ED Provider Notes (Signed)
[name]   CSN: [id number] Arrival date & time: 11/03/19  6644     History Chief Complaint  Patient presents with  . Chest Pain    Eric Savage is a 54 y.o. male.  Patient presented to the emergency department and rapid atrial fib.  This is happened to him before.  He also complains of some left flank pain which he has had problems with.  And sees urology  The history is provided by the patient. No language interpreter was used.  Chest Pain Pain location:  Substernal area Pain quality: aching   Pain radiates to:  Does not radiate Pain severity:  Mild Onset quality:  Sudden Timing:  Intermittent Progression:  Waxing and waning Chronicity:  New Context: not eating   Relieved by:  Nothing Worsened by:  Nothing Ineffective treatments:  None tried Associated symptoms: no abdominal pain, no back pain, no cough, no fatigue and no headache        Past Medical History:  Diagnosis Date  . Acid reflux   . Anxiety   . Arthritis   . CAD S/P percutaneous coronary angioplasty 12/10   RCA/CFX DES  . Chest pain 01/2013   Evaluate cardiac catheterization, no obstructive CAD  . Diverticulitis   . Dysrhythmia    OCC PALPITATION   . High cholesterol   . History of kidney stones   . Hypertension     Patient Active Problem List   Diagnosis Date Noted  . PAF (paroxysmal atrial fibrillation) ([place]); CHA2DS2-VASc Score 2 08/24/2019  . S/P colectomy 06/05/2017  . Preoperative cardiovascular examination 12/06/2016  . Diverticulitis large intestine w/o perforation or abscess w/o bleeding   . Diverticulitis 09/08/2016  . Obesity (BMI 30-39.9) 04/26/2014  . Tobacco abuse counseling 04/12/2013  . Non-compliance with treatment 01/28/2013  . Essential hypertension 05/16/2012  . Dyslipidemia, goal LDL below 70 05/16/2012  . Smoker 05/16/2012  . CAD, RCA/CFX DES 12/10 with residual 50-60% LAD.- Cath 05/19/12- and 01/28/13 no ISR- med  Rx 09/15/2009    Past Surgical History:  Procedure Laterality Date  . CHOLECYSTECTOMY N/A 08/04/2018   Procedure: LAPAROSCOPIC CHOLECYSTECTOMY;  Surgeon: Georganna Skeans, MD;  Location: [facility];  Service: General;  Laterality: N/A;  . COLONOSCOPY    . CORONARY ANGIOPLASTY WITH STENT PLACEMENT  09/2009   Proximal RCA (95%-60%) - Cypher DES 3.0 mm but 33 mm --> 3.75 mm; pCFX 80%: PCI - XIENCE V. DES 2.75 mm x 15 mm --> 3.0 mm  . LAPAROSCOPIC SIGMOID COLECTOMY N/A 06/05/2017   Procedure: SIGMOID COLECTOMY;  Surgeon: Georganna Skeans, MD;  Location: [place];  Service: General;  Laterality: N/A;  . LEFT HEART CATHETERIZATION WITH CORONARY ANGIOGRAM N/A 05/19/2012   Procedure: LEFT HEART CATHETERIZATION WITH CORONARY ANGIOGRAM;  Surgeon: Leonie Man, MD;  Location: [facility] CATH LAB;::  Cx & RCA stents patent. Moderate D1 & D2 as well as ostial AVG Cx.    Marland Kitchen LEFT HEART CATHETERIZATION WITH CORONARY ANGIOGRAM N/A 01/28/2013   Procedure: LEFT HEART CATHETERIZATION WITH CORONARY ANGIOGRAM;  Surgeon: Troy Sine, MD;  Location: [facility] CATH LAB;  Service: Cardiovascular:  No evidence for restenosis of either RCA or LCX. Mild LAD 20% narrowing and 60 - 70% smooth mid diagonal 2 stenosis.  Marland Kitchen NM MYOCAR PERF WALL MOTION  01/04/2011   Protocol:Bruce, post stress EF=69%, Exercise Cap 13 METS, attenuation defect in inferior region of myocardium, no sig. ischemia demonstrated  . TONSILLECTOMY  ~ 1973  .  TRANSTHORACIC ECHOCARDIOGRAM  05/13/2009   EF =>55%, mild mitral regurg, trace tricuspid regurg.       Family History  Problem Relation Age of Onset  . CAD Father 18       CABG    Social History   Tobacco Use  . Smoking status: Current Every Day Smoker    Packs/day: 0.50    Years: 30.00    Pack years: 15.00    Types: Cigarettes  . Smokeless tobacco: Never Used  Substance Use Topics  . Alcohol use: Yes    Alcohol/week: 8.0 - 10.0 standard drinks    Types: 8 - 10 Shots of liquor per week    Comment: twice  a week  . Drug use: No    Frequency: 7.0 times per week    Types: Marijuana, LSD    Comment: 01/28/2013 last drug use ">10 yr ago"; had reported marijuana use several times a week    Home Medications Prior to Admission medications   Medication Sig Start Date End Date Taking? Authorizing Provider  ALPRAZolam Prudy Feeler) 0.5 MG tablet Take 0.5 mg by mouth as needed for anxiety.   Yes [provider]  aspirin EC 81 MG tablet Take 81 mg by mouth at bedtime.   Yes [provider]  atorvastatin (LIPITOR) 40 MG tablet TAKE 1 TABLET BY MOUTH EVERY DAY Patient taking differently: Take 40 mg by mouth at bedtime.  09/16/19  Yes Marykay Lex, MD  lisinopril (ZESTRIL) 20 MG tablet TAKE 1 TABLET BY MOUTH EVERY DAY NEEDS APPT FOR REFILLS Patient taking differently: Take 20 mg by mouth at bedtime.  10/12/19  Yes Marykay Lex, MD  metoprolol tartrate (LOPRESSOR) 25 MG tablet Take 0.5 tablets (12.5 mg total) by mouth 2 (two) times daily. 09/16/19 11/03/19 Yes Newman Nip, NP  omeprazole (PRILOSEC) 40 MG capsule Take 40 mg by mouth at bedtime.    Yes [provider]  rivaroxaban (XARELTO) 20 MG TABS tablet Take 1 tablet (20 mg total) by mouth daily with supper. Patient taking differently: Take 20 mg by mouth at bedtime.  09/16/19  Yes Newman Nip, NP  cephALEXin (KEFLEX) 500 MG capsule Take 1 capsule (500 mg total) by mouth 4 (four) times daily. 11/03/19   Bethann Berkshire, MD  HYDROcodone-acetaminophen (NORCO/VICODIN) 5-325 MG tablet Take 1 tablet by mouth every 6 (six) hours as needed for moderate pain. 11/03/19   Bethann Berkshire, MD    Allergies    Patient has no known allergies.  Review of Systems   Review of Systems  Constitutional: Negative for appetite change and fatigue.  HENT: Negative for congestion, ear discharge and sinus pressure.   Eyes: Negative for discharge.  Respiratory: Negative for cough.   Cardiovascular: Positive for chest pain.  Gastrointestinal:  Negative for abdominal pain and diarrhea.  Genitourinary: Negative for frequency and hematuria.       Flank pain  Musculoskeletal: Negative for back pain.  Skin: Negative for rash.  Neurological: Negative for seizures and headaches.  Psychiatric/Behavioral: Negative for hallucinations.    Physical Exam Updated Vital Signs BP (!) 133/95   Pulse 64   Temp 97.6 F (36.4 C) (Oral)   Resp 17   Ht 5\' 5"  (1.651 m)   Wt 81.6 kg   SpO2 98%   BMI 29.95 kg/m   Physical Exam Vitals and nursing note reviewed.  Constitutional:      Appearance: He is well-developed.  HENT:     Head: Normocephalic.  Nose: Nose normal.  Eyes:     General: No scleral icterus.    Conjunctiva/sclera: Conjunctivae normal.  Neck:     Thyroid: No thyromegaly.  Cardiovascular:     Heart sounds: No murmur. No friction rub. No gallop.      Comments: Irregular rapid heart rate Pulmonary:     Breath sounds: No stridor. No wheezing or rales.  Chest:     Chest wall: No tenderness.  Abdominal:     General: There is no distension.     Tenderness: There is no abdominal tenderness. There is no rebound.  Musculoskeletal:        General: Normal range of motion.     Cervical back: Neck supple.  Lymphadenopathy:     Cervical: No cervical adenopathy.  Skin:    Findings: No erythema or rash.  Neurological:     Mental Status: He is alert and oriented to person, place, and time.     Motor: No abnormal muscle tone.     Coordination: Coordination normal.  Psychiatric:        Behavior: Behavior normal.     ED Results / Procedures / Treatments   Labs (all labs ordered are listed, but only abnormal results are displayed) Labs Reviewed  BASIC METABOLIC PANEL - Abnormal; Notable for the following components:      Result Value   Glucose, Bld 124 (*)    All other components within normal limits  CBC - Abnormal; Notable for the following components:   WBC 14.8 (*)    All other components within normal limits    URINALYSIS, ROUTINE W REFLEX MICROSCOPIC - Abnormal; Notable for the following components:   Hgb urine dipstick LARGE (*)    Bacteria, UA MANY (*)    All other components within normal limits  RESPIRATORY PANEL BY RT PCR (FLU A&B, COVID)  URINE CULTURE  HEPATIC FUNCTION PANEL  TROPONIN I (HIGH SENSITIVITY)  TROPONIN I (HIGH SENSITIVITY)    EKG None  Radiology DG Chest Port 1 View  Result Date: 11/03/2019 CLINICAL DATA:  Shortness of breath, chest pain EXAM: PORTABLE CHEST 1 VIEW COMPARISON:  08/05/2019 FINDINGS: The heart size and mediastinal contours are within normal limits. Both lungs are clear. The visualized skeletal structures are unremarkable. IMPRESSION: No active disease. Electronically Signed   By: Duanne Guess D.O.   On: 11/03/2019 10:35   CT Renal Stone Study  Result Date: 11/03/2019 CLINICAL DATA:  Left flank pain EXAM: CT ABDOMEN AND PELVIS WITHOUT CONTRAST TECHNIQUE: Multidetector CT imaging of the abdomen and pelvis was performed following the standard protocol without IV contrast. COMPARISON:  09/23/2019 FINDINGS: Lower chest: No acute abnormality. Hepatobiliary: Stable foci of hypoattenuation corresponding to hemangiomas on prior contrast study. Post cholecystectomy. Pancreas: Unremarkable. Spleen: Unremarkable. Adrenals/Urinary Tract: Adrenals are normal. Left renal cysts. Unchanged 5 mm nonobstructing left interpolar calculus. No hydronephrosis. Partially distended bladder is unremarkable. Stomach/Bowel: Stomach is within normal limits. Normal appendix. There are surgical changes at the level of the sigmoid. Distal colonic diverticulosis is present. Vascular/Lymphatic: Mild aortic atherosclerosis. No enlarged abdominal or pelvic lymph nodes. Reproductive: Prostate is unremarkable. Other: No abdominal wall hernia or abnormality. No abdominopelvic ascites. Musculoskeletal: No acute osseous abnormality. IMPRESSION: Unchanged nonobstructing 5 mm left renal calculus. No new  calculus or hydronephrosis. Additional chronic findings detailed above. Electronically Signed   By: Guadlupe Spanish M.D.   On: 11/03/2019 14:18    Procedures Procedures (including critical care time)  Medications Ordered in ED Medications  sodium chloride  flush (NS) 0.9 % injection 3 mL (has no administration in time range)  diltiazem (CARDIZEM) 125 mg in dextrose 5% 125 mL (1 mg/mL) infusion (0 mg/hr Intravenous Stopped 11/03/19 1251)  diltiazem (CARDIZEM) injection 10 mg (10 mg Intravenous Given 11/03/19 1013)  HYDROmorphone (DILAUDID) injection 1 mg (1 mg Intravenous Given 11/03/19 1243)  ondansetron (ZOFRAN) injection 4 mg (4 mg Intravenous Given 11/03/19 1241)    ED Course  I have reviewed the triage vital signs and the nursing notes.  Pertinent labs & imaging results that were available during my care of the patient were reviewed by me and considered in my medical decision making (see chart for details).    MDM Rules/Calculators/A&P                      CRITICAL CARE Performed by: Bethann Berkshire Total critical care time:45 minutes Critical care time was exclusive of separately billable procedures and treating other patients. Critical care was necessary to treat or prevent imminent or life-threatening deterioration. Critical care was time spent personally by me on the following activities: development of treatment plan with patient and/or surrogate as well as nursing, discussions with consultants, evaluation of patient's response to treatment, examination of patient, obtaining history from patient or surrogate, ordering and performing treatments and interventions, ordering and review of laboratory studies, ordering and review of radiographic studies, pulse oximetry and re-evaluation of patient's condition. Patient with rapid atrial fib that was converted with Cardizem IV.  He also has hematuria and most likely urinary tract infection.  I spoke with cardiology and they will see him  tomorrow in the office.  We will not start him on any additional cardiac medicines.  His urine has been cultured and he started on Keflex and he will follow up with urology next week Final Clinical Impression(s) / ED Diagnoses Final diagnoses:  Atrial fibrillation with rapid ventricular response (HCC)  Acute cystitis with hematuria    Rx / DC Orders ED Discharge Orders         Ordered    cephALEXin (KEFLEX) 500 MG capsule  4 times daily     11/03/19 1430    HYDROcodone-acetaminophen (NORCO/VICODIN) 5-325 MG tablet  Every 6 hours PRN     11/03/19 1430           Bethann Berkshire, MD 11/03/19 1434

## 2019-11-04 ENCOUNTER — Encounter (HOSPITAL_COMMUNITY): Payer: Self-pay | Admitting: Nurse Practitioner

## 2019-11-04 ENCOUNTER — Ambulatory Visit (HOSPITAL_COMMUNITY)
Admission: RE | Admit: 2019-11-04 | Discharge: 2019-11-04 | Disposition: A | Discharge status: Home / Self Care | Payer: 59 | Source: Ambulatory Visit | Attending: Nurse Practitioner | Admitting: Nurse Practitioner

## 2019-11-04 ENCOUNTER — Telehealth: Payer: Self-pay | Admitting: *Deleted

## 2019-11-04 ENCOUNTER — Ambulatory Visit (HOSPITAL_COMMUNITY): Payer: 59 | Admitting: Nurse Practitioner

## 2019-11-04 VITALS — BP 110/70 | HR 53 | Ht 65.0 in | Wt 182.8 lb

## 2019-11-04 DIAGNOSIS — Z7901 Long term (current) use of anticoagulants: Secondary | ICD-10-CM | POA: Insufficient documentation

## 2019-11-04 DIAGNOSIS — M1990 Unspecified osteoarthritis, unspecified site: Secondary | ICD-10-CM | POA: Insufficient documentation

## 2019-11-04 DIAGNOSIS — I480 Paroxysmal atrial fibrillation: Secondary | ICD-10-CM | POA: Diagnosis present

## 2019-11-04 DIAGNOSIS — D6869 Other thrombophilia: Secondary | ICD-10-CM

## 2019-11-04 DIAGNOSIS — Z79899 Other long term (current) drug therapy: Secondary | ICD-10-CM | POA: Insufficient documentation

## 2019-11-04 DIAGNOSIS — F17210 Nicotine dependence, cigarettes, uncomplicated: Secondary | ICD-10-CM | POA: Insufficient documentation

## 2019-11-04 DIAGNOSIS — I2510 Atherosclerotic heart disease of native coronary artery without angina pectoris: Secondary | ICD-10-CM | POA: Insufficient documentation

## 2019-11-04 DIAGNOSIS — I10 Essential (primary) hypertension: Secondary | ICD-10-CM | POA: Diagnosis not present

## 2019-11-04 DIAGNOSIS — F419 Anxiety disorder, unspecified: Secondary | ICD-10-CM | POA: Insufficient documentation

## 2019-11-04 LAB — URINE CULTURE: Culture: NO GROWTH

## 2019-11-04 NOTE — Telephone Encounter (Signed)
-----   Message from Shona Simpson, RN sent at 11/04/2019 11:39 AM EST ----- Regarding: sleep study Pt now ready to proceed with in home sleep study that was ordered back in November. Please contact pt to set up. Thanks [facility]

## 2019-11-04 NOTE — Telephone Encounter (Signed)
Patient called and notified of HSTappointment scheduled on 01/01/20 @ 11:00 am.
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
## 2019-11-24 ENCOUNTER — Ambulatory Visit
Admission: RE | Admit: 2019-11-24 | Discharge: 2019-11-24 | Disposition: A | Discharge status: Home / Self Care | Payer: 59 | Source: Ambulatory Visit | Attending: Gastroenterology | Admitting: Gastroenterology

## 2019-11-24 ENCOUNTER — Other Ambulatory Visit: Payer: Self-pay | Admitting: Gastroenterology

## 2019-11-24 ENCOUNTER — Other Ambulatory Visit: Payer: Self-pay

## 2019-11-24 DIAGNOSIS — R1032 Left lower quadrant pain: Secondary | ICD-10-CM

## 2019-11-24 MED ORDER — IOPAMIDOL (ISOVUE-300) INJECTION 61%
100.0000 mL | Freq: Once | INTRAVENOUS | Status: AC | PRN
  Administered 2019-11-24: 100 mL via INTRAVENOUS

## 2019-12-01 ENCOUNTER — Other Ambulatory Visit: Payer: Self-pay | Admitting: Orthopaedic Surgery

## 2019-12-01 DIAGNOSIS — M1990 Unspecified osteoarthritis, unspecified site: Secondary | ICD-10-CM

## 2019-12-01 DIAGNOSIS — M25521 Pain in right elbow: Secondary | ICD-10-CM

## 2019-12-02 ENCOUNTER — Telehealth: Payer: Self-pay | Admitting: *Deleted

## 2019-12-02 NOTE — Telephone Encounter (Signed)
   [facility] Pre-operative Risk Assessment    Request for surgical clearance:  1. What type of surgery is being performed? Right elbow scope, loose body exc.   2. When is this surgery scheduled? TBD   3. What type of clearance is required (medical clearance vs. Pharmacy clearance to hold med vs. Both)? both  4. Are there any medications that need to be held prior to surgery and how long? Xarelto, ASA   5. Practice name and name of physician performing surgery? [facility] Dr[facility] Griffin Basil   6. What is your office phone number [phone] ext. [phone]    7.   What is your office fax number [phone] attn: Sherri  8.   Anesthesia type (None, local, MAC, general) ?    Eluzer Howdeshell A Jalecia Leon 12/02/2019, 3:07 PM  _________________________________________________________________

## 2019-12-02 NOTE — Telephone Encounter (Signed)
   Primary Cardiologist: [name], MD  Chart reviewed as part of pre-operative protocol coverage. Patient was contacted 12/02/2019 in reference to pre-operative risk assessment for pending surgery as outlined below.  LAVAR ROSENZWEIG was last seen on 11/04/19 by Rudi Coco for PAF he also has hx of stents to Coronary arteries in 2010 and this was stble on last cath 2014 and normal stress test 08/2019.  Since that day, WASIF SIMONICH has done well meeting 4 METS with exertion without angina. He occ. Has break through atrial fib.   Therefore, based on ACC/AHA guidelines, the patient would be at acceptable risk for the planned procedure without further cardiovascular testing.   He may hold his Xarelto 1-2 days prior to procedure and resume post op.   I will route this recommendation to the requesting party via Epic fax function and remove from pre-op pool.  Please call with questions.  Nada Boozer, NP 12/02/2019, 5:14 PM

## 2019-12-02 NOTE — Telephone Encounter (Signed)
Pharm please address xarelto thanks 

## 2019-12-02 NOTE — Telephone Encounter (Signed)
Pt takes Xarelto for afib with CHADS2VASc score of 2 (HTN, CAD). Recent dx of afib in October 2020, converted to sinus rhythm spontaneously. Went to ED on 11/03/19 with another episode of afib, converted with IV Cardizem. Renal function is normal. Ok to hold Xarelto for 1-2 days prior to procedure.

## 2019-12-04 ENCOUNTER — Ambulatory Visit
Admission: RE | Admit: 2019-12-04 | Discharge: 2019-12-04 | Disposition: A | Discharge status: Home / Self Care | Payer: 59 | Source: Ambulatory Visit | Attending: Orthopaedic Surgery | Admitting: Orthopaedic Surgery

## 2019-12-04 DIAGNOSIS — M1990 Unspecified osteoarthritis, unspecified site: Secondary | ICD-10-CM

## 2019-12-04 DIAGNOSIS — M25521 Pain in right elbow: Secondary | ICD-10-CM

## 2019-12-07 ENCOUNTER — Ambulatory Visit: Payer: 59 | Admitting: Cardiology

## 2019-12-07 NOTE — Progress Notes (Deleted)
Primary Care Provider: Marva Panda, NP Cardiologist: [name], MD Electrophysiologist:   Clinic Note: No chief complaint on file.  HPI:    Eric Savage is a 54 y.o. male with prior h/o CAD-PCI (2010 PCI RCA & Cx) & now New Dx of PAF who presents today for close f/u from Afib Clinic.  Eric Savage was last seen by me in November 2020 as follow-up from A. fib clinic.  (August 05, 2019 new onset A. fib rates in the 150-170 range spontaneously converted with IV fluids.  Start on low-dose Lopressor along with Xarelto, referred to A. fib clinic) noted fatigue with metoprolol that was reduced to 12.5 mg twice daily.  In the A. fib clinic, event monitor were.  Counseled to reduce alcohol intake and weight loss. -> No prolonged spells of A. fib, I had 1 episode after chasing a Peeping Tom that was looking on his grandchildren.  He was very upset.  That episode lasted about a minute, and he had 1 more after that.  Noted mid chest pain off and on.  He did develop a discomfort in his chest along with dyspnea with tachycardia.  Was due to see urology with concerns of hematuria.  Ordered Lexiscan Myoview, counseled on smoking cessation and alcohol reduction.  Recent Hospitalizations:   ER visits November 03, 2019, with the A. fib RVR, in setting of left flank pain.  And chest pain -> given IV diltiazem with conversion to sinus rhythm.  Felt to have a possible UTI.  Given Keflex.  Scheduled to be seen in A. fib clinic following day.   Seen in A. fib clinic by Rudi Coco, NP on January 27. ->  He had noted for previous spells lasting less than 15 minutes.  Had cut down on smoking but not fully.  Cut back significantly on alcohol, and limited caffeine.  Had not yet done his home sleep study.  Discussed lifestyle changes again.-Limit alcohol to less than 2 drinks a week, continue smoking cessation Attempts.    Reorder sleep study.  Change beta-blocker to as needed.  .  Continue  Xarelto  Discussed Multaq, but not ready for that yet  Reviewed  CV studies:    The following studies were reviewed today: (if available, images/films reviewed: From Epic Chart or Care Everywhere) . Event Monitor - just turned in, not ready to read yet. Marland Kitchen Lexiscan Myoview   Interval History:   Eric Savage is here today for his primary cardiology follow-up indicating    CV Review of Symptoms (Summary) Cardiovascular ROS: positive for - chest pain, palpitations, rapid heart rate and Very sporadic symptoms.  See above negative for - chest pain, edema, orthopnea, paroxysmal nocturnal dyspnea, shortness of breath or Syncope/near syncope, TIA/amaurosis fugax  The patient does not have symptoms concerning for COVID-19 infection (fever, chills, cough, or new shortness of breath).  The patient is practicing social distancing. ++ Masking.  He routinely will go out for groceries/shopping.  Still very busy at work, but a lot of this is outside and relatively easy to do social distancing.  When he is inside and in places where people tend to congregate, he would wear a mask and occasionally gloves.  *He continues to smoke.  Despite the fact that he knows he needs to quit, he has not been able do that.   REVIEWED OF SYSTEMS   A comprehensive ROS was performed.  Pertinent symptoms noted in HPI Review of Systems  Constitutional: Negative for  malaise/fatigue and weight loss.  HENT: Negative for congestion and nosebleeds.   Respiratory: Positive for cough (Sometimes in the morning). Negative for shortness of breath (If he overdoes it).   Cardiovascular: Negative for leg swelling.  Gastrointestinal: Negative for blood in stool, diarrhea, heartburn and melena.  Genitourinary: Positive for dysuria and hematuria (Noted in HPI).  Musculoskeletal: Positive for back pain. Negative for falls and joint pain.  Neurological: Negative for dizziness and focal weakness.  Endo/Heme/Allergies: Negative for  environmental allergies. Does not bruise/bleed easily.  Psychiatric/Behavioral: The patient is nervous/anxious.        He has issues of anxiety that he oftentimes will take as needed Xanax.  Sometimes he also will drink a little more alcohol than usual to relax.  He does not mix the 2.  He indicates that he is quite able to go several days if not a week or 2 without alcohol.  All other systems reviewed and are negative.  I have reviewed and (if needed) personally updated the patient's problem list, medications, allergies, past medical and surgical history, social and family history.   PAST MEDICAL HISTORY   Past Medical History:  Diagnosis Date  . Acid reflux   . Anxiety   . Arthritis   . CAD S/P percutaneous coronary angioplasty 12/10   RCA/CFX DES  . Chest pain 01/2013   Evaluate cardiac catheterization, no obstructive CAD  . Diverticulitis   . Dysrhythmia    OCC PALPITATION   . High cholesterol   . History of kidney stones   . Hypertension     PAST SURGICAL HISTORY   Past Surgical History:  Procedure Laterality Date  . CHOLECYSTECTOMY N/A 08/04/2018   Procedure: LAPAROSCOPIC CHOLECYSTECTOMY;  Surgeon: Georganna Skeans, MD;  Location: [place];  Service: General;  Laterality: N/A;  . COLONOSCOPY    . CORONARY ANGIOPLASTY WITH STENT PLACEMENT  09/2009   Proximal RCA (95%-60%) - Cypher DES 3.0 mm but 33 mm --> 3.75 mm; pCFX 80%: PCI - XIENCE V. DES 2.75 mm x 15 mm --> 3.0 mm  . LAPAROSCOPIC SIGMOID COLECTOMY N/A 06/05/2017   Procedure: SIGMOID COLECTOMY;  Surgeon: Georganna Skeans, MD;  Location: [place];  Service: General;  Laterality: N/A;  . LEFT HEART CATHETERIZATION WITH CORONARY ANGIOGRAM N/A 05/19/2012   Procedure: LEFT HEART CATHETERIZATION WITH CORONARY ANGIOGRAM;  Surgeon: Leonie Man, MD;  Location: [facility] CATH LAB;::  Cx & RCA stents patent. Moderate D1 & D2 as well as ostial AVG Cx.    Marland Kitchen LEFT HEART CATHETERIZATION WITH CORONARY ANGIOGRAM N/A 01/28/2013   Procedure: LEFT  HEART CATHETERIZATION WITH CORONARY ANGIOGRAM;  Surgeon: Troy Sine, MD;  Location[facility] C[facility] LAB;  Service: Cardiovascular:  No evidence for restenosis of either RCA or LCX. Mild LAD 20% narrowing and 60 - 70% smooth mid diagonal 2 stenosis.  Marland Kitchen NM MYOCAR PERF WALL MOTION  01/04/2011   Protocol:Bruce, post stress EF=69%, Exercise Cap 13 METS, attenuation defect in inferior region of myocardium, no sig. ischemia demonstrated  . TONSILLECTOMY  ~ 1973  . TRANSTHORACIC ECHOCARDIOGRAM  05/13/2009   EF =>55%, mild mitral regurg, trace tricuspid regurg.    MEDICATIONS/ALLERGIES   No outpatient medications have been marked as taking for the 12/07/19 encounter (Appointment) with Leonie Man, MD.    No Known Allergies   SOCIAL HISTORY/FAMILY HISTORY   Social History   Tobacco Use  . Smoking status: Current Every Day Smoker    Packs/day: 0.75    Years: 30.00  Pack years: 22.50    Types: Cigarettes  . Smokeless tobacco: Never Used  Substance Use Topics  . Alcohol use: Yes    Alcohol/week: 8.0 - 10.0 standard drinks    Types: 8 - 10 Shots of liquor per week    Comment: twice a week  . 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   Is active, but with a re-established spousal relationship, is eating a lot more "good - home cooked meals. -->  Often high in salt.      He reports drinking a 1/5 of liquor weekly. Takes " swigs form the bottle, sometimes 7-8 in one night. Reports that he does not do this nightly. He does smoke. He snores loudly. He drinks moderate caffeine    Family History family history includes CAD (age of onset: 6) in his father.   OBJCTIVE -PE, EKG,  labs   Wt Readings from Last 3 Encounters:  11/04/19 182 lb 12.8 oz (82.9 kg)  11/03/19 180 lb (81.6 kg)  08/28/19 180 lb (81.6 kg)    Physical Exam: There were no vitals taken for this visit. Physical Exam  Constitutional: He is oriented to person, place, and time. He appears well-developed and well-nourished.  Well-groomed.  No acute distress.  Healthy-appearing  HENT:  Head: Normocephalic and atraumatic.  Neck: No JVD present.  Cardiovascular: Normal rate, regular rhythm, normal heart sounds and intact distal pulses. Exam reveals no gallop and no friction rub.  No murmur heard. Pulmonary/Chest: Effort normal and breath sounds normal. No respiratory distress. He has no wheezes. He has no rales.  Abdominal: Soft. Bowel sounds are normal. He exhibits no distension. There is no abdominal tenderness. There is no rebound.  Musculoskeletal:        General: No edema. Normal range of motion.     Cervical back: Normal range of motion and neck supple.  Neurological: He is alert and oriented to person, place, and time. No cranial nerve deficit.  Psychiatric: He has a normal mood and affect. His behavior is normal. Judgment and thought content normal.  Vitals reviewed.    Adult ECG Report  Rate: 56 ;  Rhythm: sinus bradycardia and Normal axis, intervals and durations.;   Narrative Interpretation: Relatively normal  Recent Labs:    Lab Results  Component Value Date   CHOL 103 08/24/2019   HDL 38 (L) 08/24/2019   LDLCALC 47 08/24/2019   TRIG 92 08/24/2019   CHOLHDL 2.7 08/24/2019   Lab Results  Component Value Date   CREATININE 1.08 11/03/2019   BUN 15 11/03/2019   NA 140 11/03/2019   K 4.0 11/03/2019   CL 105 11/03/2019   CO2 27 11/03/2019    ASSESSMENT/PLAN    Problem List Items Addressed This Visit    CAD, RCA/CFX DES 12/10 with residual 50-60% LAD.- Cath 05/19/12- and 01/28/13 no ISR- med Rx - Primary (Chronic)   PAF (paroxysmal atrial fibrillation) (HCC); CHA2DS2-VASc  Score 2 (Chronic)   Essential hypertension (Chronic)   Dyslipidemia, goal LDL below 70 (Chronic)   Obesity (BMI 30-39.9) (Chronic)   Smoker (Chronic)  COVID-19 Education: The signs and symptoms of COVID-19 were discussed with the patient and how to seek care for testing (follow up with PCP or arrange E-visit).   The importance of social distancing was discussed today.  I spent a total of *** minutes with the patient and chart review. >  50% of the time was spent in direct patient consultation.  Additional time spent with chart review (studies, outside notes, etc): 10 Total Time: *** min   Current medicines are reviewed at length with the patient today.  (+/- concerns) n/a   Patient Instructions / Medication Changes & Studies & Tests Ordered   There are no Patient Instructions on file for this visit.   Studies Ordered:   No orders of the defined types were placed in this encounter.    [name], M.D., M.S. Interventional Cardiologist   Pager # [phone] Phone # [phone] [address] [place], [place] [address]   Thank you for choosing [facility] at [facility]!!

## 2019-12-10 ENCOUNTER — Other Ambulatory Visit: Payer: 59

## 2019-12-10 ENCOUNTER — Encounter (HOSPITAL_BASED_OUTPATIENT_CLINIC_OR_DEPARTMENT_OTHER): Payer: Self-pay | Admitting: Orthopaedic Surgery

## 2019-12-10 ENCOUNTER — Other Ambulatory Visit: Payer: Self-pay

## 2019-12-12 ENCOUNTER — Other Ambulatory Visit (HOSPITAL_COMMUNITY)
Admission: RE | Admit: 2019-12-12 | Discharge: 2019-12-12 | Disposition: A | Discharge status: Home / Self Care | Payer: 59 | Source: Ambulatory Visit | Attending: Orthopaedic Surgery | Admitting: Orthopaedic Surgery

## 2019-12-12 DIAGNOSIS — Z20822 Contact with and (suspected) exposure to covid-19: Secondary | ICD-10-CM | POA: Diagnosis not present

## 2019-12-12 DIAGNOSIS — Z01812 Encounter for preprocedural laboratory examination: Secondary | ICD-10-CM | POA: Diagnosis present

## 2019-12-12 LAB — SARS CORONAVIRUS 2 (TAT 6-24 HRS): SARS Coronavirus 2: NEGATIVE

## 2019-12-15 NOTE — H&P (Addendum)
PREOPERATIVE H&P  Chief Complaint: RIGHT ELBOW LOOSE BODY LATERAL EPICONDYLITIS  HPI: Eric Savage is a 54 y.o. male who is scheduled for DEBRIDEMENT OF RIGHT ELBOW LOOSE BODY REMOVAL AND OPEN TENDON REPAIR.   Patient has a past medical history significant for hypertension, high cholesterol, a. fib, CAD s/p percutaneous coronary angioplasty, acid reflux, and diverticulitis.   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  He has had bilateral elbow pain, right greater than left, for many years.  He now has trouble with activities such as gripping pliers and doing strong work with his hands.  He has no numbness or tingling currently.  He has not made progress with anti-inflammatories or activity modification. He is limited on the right elbow. He has pain with terminal flexion and extension pain. The most significant pain is the lateral epicondylitis type pain.   His symptoms are rated as moderate to severe, and have been worsening.  This is significantly impairing activities of daily living.    Please see clinic note for further details on this patient's care.    He has elected for surgical management.   Past Medical History:  Diagnosis Date  . Acid reflux   . Anxiety   . Arthritis   . CAD S/P percutaneous coronary angioplasty 12/10   RCA/CFX DES  . Chest pain 01/2013   Evaluate cardiac catheterization, no obstructive CAD  . Diverticulitis   . Dysrhythmia    OCC PALPITATION   . High cholesterol   . History of kidney stones   . Hypertension    Past Surgical History:  Procedure Laterality Date  . CHOLECYSTECTOMY N/A 08/04/2018   Procedure: LAPAROSCOPIC CHOLECYSTECTOMY;  Surgeon: Violeta Gelinas, MD;  Location: [facility];  Service: General;  Laterality: N/A;  . COLONOSCOPY    . CORONARY ANGIOPLASTY WITH STENT PLACEMENT  09/2009   Proximal RCA (95%-60%) - Cypher DES 3.0 mm but 33 mm --> 3.75 mm; pCFX 80%: PCI - XIENCE V. DES 2.75  mm x 15 mm --> 3.0 mm  . LAPAROSCOPIC SIGMOID COLECTOMY N/A 06/05/2017   Procedure: SIGMOID COLECTOMY;  Surgeon: Violeta Gelinas, MD;  Location: [facility] OR;  Service: General;  Laterality: N/A;  . LEFT HEART CATHETERIZATION WITH CORONARY ANGIOGRAM N/A 05/19/2012   Procedure: LEFT HEART CATHETERIZATION WITH CORONARY ANGIOGRAM;  Surgeon: Marykay Lex, MD;  Location: [facility] CATH LAB;::  Cx & RCA stents patent. Moderate D1 & D2 as well as ostial AVG Cx.    Marland Kitchen LEFT HEART CATHETERIZATION WITH CORONARY ANGIOGRAM N/A 01/28/2013   Procedure: LEFT HEART CATHETERIZATION WITH CORONARY ANGIOGRAM;  Surgeon: Lennette Bihari, MD;  Location: [facility] CATH LAB;  Service: Cardiovascular:  No evidence for restenosis of either RCA or LCX. Mild LAD 20% narrowing and 60 - 70% smooth mid diagonal 2 stenosis.  Marland Kitchen NM MYOCAR PERF WALL MOTION  01/04/2011   Protocol:Bruce, post stress EF=69%, Exercise Cap 13 METS, attenuation defect in inferior region of myocardium, no sig. ischemia demonstrated  . TONSILLECTOMY  ~ 1973  . TRANSTHORACIC ECHOCARDIOGRAM  05/13/2009   EF =>55%, mild mitral regurg, trace tricuspid regurg.   Social History   Socioeconomic History  . Marital status: Married    Spouse name: Not on file  . Number of children: Not on file  . Years of education: Not on file  . Highest education level: Not on file  Occupational History  . Occupation: Unemployed  Tobacco Use  . Smoking status:  Current Every Day Smoker    Packs/day: 0.25    Years: 30.00    Pack years: 7.50    Types: Cigarettes  . Smokeless tobacco: Never Used  Substance and Sexual Activity  . Alcohol use: Yes    Alcohol/week: 8.0 - 10.0 standard drinks    Types: 8 - 10 Shots of liquor per week    Comment: twice a week  . Drug use: No    Frequency: 7.0 times per week    Types: Marijuana, LSD    Comment: 01/28/2013 last drug use ">10 yr ago"; had reported marijuana use several times a week  . Sexual activity: Not Currently  Other Topics Concern  . 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   Is active, but with a re-established spousal relationship, is eating a lot more "good - home cooked meals. -->  Often high in salt.      He reports drinking a 1/5 of liquor weekly. Takes " swigs form the bottle, sometimes 7-8 in one night. Reports that he does not do this nightly. He does smoke. He snores loudly. He drinks moderate caffeine   Social Determinants of Health   Financial Resource Strain:   . Difficulty of Paying Living Expenses: Not on file  Food Insecurity:   . Worried About Programme researcher, broadcasting/film/video in the Last Year: Not on file  . Ran Out of Food in the Last Year: Not on file  Transportation Needs:   . Lack of Transportation (Medical): Not on file  . Lack of Transportation (Non-Medical): Not on file  Physical Activity:   . Days of Exercise per Week: Not on file  . Minutes of Exercise per Session: Not on file  Stress:   . Feeling of Stress : Not on file  Social Connections:   . Frequency of Communication with Friends and Family: Not on file  . Frequency of Social Gatherings with Friends and Family: Not on file  . Attends Religious Services: Not on file  . Active Member of Clubs or Organizations: Not on file  . Attends Banker Meetings: Not on file  . Marital Status: Not on file   Family History  Problem Relation Age of Onset  . CAD Father 47       CABG   No Known Allergies Prior to Admission medications   Medication Sig Start Date End Date Taking? Authorizing Provider  ALPRAZolam Prudy Feeler) 0.5 MG tablet Take 0.5 mg by mouth as needed for anxiety.   Yes [provider]  aspirin EC 81 MG tablet Take 81 mg by mouth at bedtime.   Yes [provider]  atorvastatin (LIPITOR) 40 MG tablet TAKE 1 TABLET BY MOUTH EVERY DAY Patient taking differently: Take 40 mg by mouth at  bedtime.  09/16/19  Yes Marykay Lex, MD  cephALEXin (KEFLEX) 500 MG capsule Take 1 capsule (500 mg total) by mouth 4 (four) times daily. 11/03/19  Yes Bethann Berkshire, MD  lisinopril (ZESTRIL) 20 MG tablet TAKE 1 TABLET BY MOUTH EVERY DAY NEEDS APPT FOR REFILLS Patient taking differently: Take 20 mg by mouth at bedtime.  10/12/19  Yes Marykay Lex, MD  metoprolol tartrate (LOPRESSOR) 25 MG tablet Take 0.5 tablets (12.5 mg total) by mouth 2 (two) times daily. 09/16/19 12/10/19 Yes Newman Nip, NP  omeprazole (  PRILOSEC) 40 MG capsule Take 40 mg by mouth at bedtime.    Yes [provider]  oxyCODONE-acetaminophen (PERCOCET/ROXICET) 5-325 MG tablet Take 1 tablet by mouth every 6 (six) hours as needed for severe pain. 11/03/19  Yes Milton Ferguson, MD  rivaroxaban (XARELTO) 20 MG TABS tablet Take 1 tablet (20 mg total) by mouth daily with supper. Patient taking differently: Take 20 mg by mouth at bedtime.  09/16/19  Yes Sherran Needs, NP    ROS: All other systems have been reviewed and were otherwise negative with the exception of those mentioned in the HPI and as above.  Physical Exam: General: Alert, no acute distress Cardiovascular: No pedal edema Respiratory: No cyanosis, no use of accessory musculature GI: No organomegaly, abdomen is soft and non-tender Skin: No lesions in the area of chief complaint Neurologic: Sensation intact distally Psychiatric: Patient is competent for consent with normal mood and affect Lymphatic: No axillary or cervical lymphadenopathy  MUSCULOSKELETAL:  Right elbow: range of motion is from 26-107 degrees. He has relatively preserved pronation and supination. He is tender to palpation over the lateral epicondyle on the right.  Imaging: Three views of the right elbow demonstrate joint space narrowing and osteophytosis in the olecranon fossa consistent with an end stage arthritis.   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   Plan: Plan for Procedure(s): DEBRIDEMENT OF RIGHT ELBOW LOOSE BODY REMOVAL AND OPEN TENDON REPAIR  The risks benefits and alternatives were discussed with the patient including but not limited to the risks of nonoperative treatment, versus surgical intervention including infection, bleeding, nerve injury,  blood clots, cardiopulmonary complications, morbidity, mortality, among others, and they were willing to proceed.   The patient acknowledged the explanation, agreed to proceed with the plan and consent was signed.   Patient has received clearance from cardiologist, Cecilie Kicks NP. He will hold his Xarelto 1-2 days prior to procedure and resume post op.   Operative Plan: Right elbow capsular release, osteophyte resection, debridement of his lateral epicondylitis, and ulnar nerve transposition Discharge Medications: Tylenol, Gabapentin, Oxycodone, Zofran (avoid NSAIDs due to Xarelto) DVT Prophylaxis: None Physical Therapy: Outpatient PT Special Discharge needs: Splint. Sling for comfort   Ethelda Chick, PA-C  12/15/2019 4:59 PM

## 2019-12-16 ENCOUNTER — Ambulatory Visit (HOSPITAL_BASED_OUTPATIENT_CLINIC_OR_DEPARTMENT_OTHER)
Admission: RE | Admit: 2019-12-16 | Discharge: 2019-12-16 | Disposition: A | Discharge status: Home / Self Care | Payer: 59 | Attending: Orthopaedic Surgery | Admitting: Orthopaedic Surgery

## 2019-12-16 ENCOUNTER — Encounter (HOSPITAL_BASED_OUTPATIENT_CLINIC_OR_DEPARTMENT_OTHER): Payer: Self-pay | Admitting: Orthopaedic Surgery

## 2019-12-16 ENCOUNTER — Other Ambulatory Visit: Payer: Self-pay

## 2019-12-16 ENCOUNTER — Encounter (HOSPITAL_BASED_OUTPATIENT_CLINIC_OR_DEPARTMENT_OTHER)
Admission: RE | Disposition: A | Discharge status: Home / Self Care | Payer: Self-pay | Source: Home / Self Care | Attending: Orthopaedic Surgery

## 2019-12-16 ENCOUNTER — Ambulatory Visit (HOSPITAL_BASED_OUTPATIENT_CLINIC_OR_DEPARTMENT_OTHER): Payer: 59 | Admitting: Certified Registered Nurse Anesthetist

## 2019-12-16 DIAGNOSIS — F419 Anxiety disorder, unspecified: Secondary | ICD-10-CM | POA: Insufficient documentation

## 2019-12-16 DIAGNOSIS — K219 Gastro-esophageal reflux disease without esophagitis: Secondary | ICD-10-CM | POA: Insufficient documentation

## 2019-12-16 DIAGNOSIS — M19021 Primary osteoarthritis, right elbow: Secondary | ICD-10-CM | POA: Diagnosis not present

## 2019-12-16 DIAGNOSIS — I4891 Unspecified atrial fibrillation: Secondary | ICD-10-CM | POA: Diagnosis not present

## 2019-12-16 DIAGNOSIS — G5621 Lesion of ulnar nerve, right upper limb: Secondary | ICD-10-CM | POA: Diagnosis not present

## 2019-12-16 DIAGNOSIS — Z7982 Long term (current) use of aspirin: Secondary | ICD-10-CM | POA: Diagnosis not present

## 2019-12-16 DIAGNOSIS — M7711 Lateral epicondylitis, right elbow: Secondary | ICD-10-CM | POA: Insufficient documentation

## 2019-12-16 DIAGNOSIS — Z955 Presence of coronary angioplasty implant and graft: Secondary | ICD-10-CM | POA: Insufficient documentation

## 2019-12-16 DIAGNOSIS — I10 Essential (primary) hypertension: Secondary | ICD-10-CM | POA: Diagnosis not present

## 2019-12-16 DIAGNOSIS — F17210 Nicotine dependence, cigarettes, uncomplicated: Secondary | ICD-10-CM | POA: Insufficient documentation

## 2019-12-16 DIAGNOSIS — M25721 Osteophyte, right elbow: Secondary | ICD-10-CM | POA: Diagnosis present

## 2019-12-16 DIAGNOSIS — Z8249 Family history of ischemic heart disease and other diseases of the circulatory system: Secondary | ICD-10-CM | POA: Diagnosis not present

## 2019-12-16 DIAGNOSIS — E7800 Pure hypercholesterolemia, unspecified: Secondary | ICD-10-CM | POA: Diagnosis not present

## 2019-12-16 DIAGNOSIS — Z79899 Other long term (current) drug therapy: Secondary | ICD-10-CM | POA: Diagnosis not present

## 2019-12-16 DIAGNOSIS — M24021 Loose body in right elbow: Secondary | ICD-10-CM | POA: Insufficient documentation

## 2019-12-16 DIAGNOSIS — Z7901 Long term (current) use of anticoagulants: Secondary | ICD-10-CM | POA: Insufficient documentation

## 2019-12-16 DIAGNOSIS — Z9049 Acquired absence of other specified parts of digestive tract: Secondary | ICD-10-CM | POA: Diagnosis not present

## 2019-12-16 DIAGNOSIS — I2510 Atherosclerotic heart disease of native coronary artery without angina pectoris: Secondary | ICD-10-CM | POA: Insufficient documentation

## 2019-12-16 HISTORY — PX: TENNIS ELBOW RELEASE/NIRSCHEL PROCEDURE: SHX6651

## 2019-12-16 HISTORY — PX: ULNAR NERVE TRANSPOSITION: SHX2595

## 2019-12-16 SURGERY — TENNIS ELBOW RELEASE/NIRSCHEL PROCEDURE
Anesthesia: General | Site: Elbow | Laterality: Right

## 2019-12-16 MED ORDER — SODIUM CHLORIDE 0.9 % IR SOLN
Status: DC | PRN
  Administered 2019-12-16: 1000 mL

## 2019-12-16 MED ORDER — ONDANSETRON HCL 4 MG PO TABS: 4.0000 mg | ORAL_TABLET | Freq: Three times a day (TID) | ORAL | 1 refills | Status: AC | PRN

## 2019-12-16 MED ORDER — FENTANYL CITRATE (PF) 100 MCG/2ML IJ SOLN
INTRAMUSCULAR | Status: DC | PRN
  Administered 2019-12-16: 100 ug via INTRAVENOUS
  Administered 2019-12-16 (×2): 50 ug via INTRAVENOUS

## 2019-12-16 MED ORDER — OXYCODONE HCL 5 MG PO TABS: ORAL_TABLET | ORAL | 0 refills | Status: AC

## 2019-12-16 MED ORDER — FENTANYL CITRATE (PF) 100 MCG/2ML IJ SOLN
50.0000 ug | Freq: Once | INTRAMUSCULAR | Status: AC
  Administered 2019-12-16: 50 ug via INTRAVENOUS

## 2019-12-16 MED ORDER — ROCURONIUM BROMIDE 10 MG/ML (PF) SYRINGE
PREFILLED_SYRINGE | INTRAVENOUS | Status: DC | PRN
  Administered 2019-12-16: 50 mg via INTRAVENOUS

## 2019-12-16 MED ORDER — ONDANSETRON HCL 4 MG/2ML IJ SOLN: 4.0000 mg | Freq: Once | INTRAMUSCULAR | Status: DC | PRN

## 2019-12-16 MED ORDER — HYDROMORPHONE HCL 1 MG/ML IJ SOLN
0.5000 mg | INTRAMUSCULAR | Status: DC | PRN
  Administered 2019-12-16: 0.5 mg via INTRAVENOUS

## 2019-12-16 MED ORDER — MELOXICAM 7.5 MG PO TABS: 7.5000 mg | ORAL_TABLET | Freq: Every day | ORAL | 0 refills | Status: AC

## 2019-12-16 MED ORDER — OXYCODONE HCL 5 MG PO TABS
ORAL_TABLET | ORAL | Status: AC
  Filled 2019-12-16: qty 1

## 2019-12-16 MED ORDER — INDOMETHACIN ER 75 MG PO CPCR: 75.0000 mg | ORAL_CAPSULE | Freq: Two times a day (BID) | ORAL | 0 refills | Status: AC

## 2019-12-16 MED ORDER — VANCOMYCIN HCL 1000 MG IV SOLR
INTRAVENOUS | Status: AC
  Filled 2019-12-16: qty 1000

## 2019-12-16 MED ORDER — CEFAZOLIN SODIUM-DEXTROSE 2-4 GM/100ML-% IV SOLN
INTRAVENOUS | Status: AC
  Filled 2019-12-16: qty 100

## 2019-12-16 MED ORDER — FENTANYL CITRATE (PF) 100 MCG/2ML IJ SOLN
INTRAMUSCULAR | Status: AC
  Filled 2019-12-16: qty 2

## 2019-12-16 MED ORDER — KETOROLAC TROMETHAMINE 30 MG/ML IJ SOLN
INTRAMUSCULAR | Status: DC | PRN
  Administered 2019-12-16: 30 mg via INTRAVENOUS

## 2019-12-16 MED ORDER — VANCOMYCIN HCL 1000 MG IV SOLR
INTRAVENOUS | Status: DC | PRN
  Administered 2019-12-16: 1000 mg via TOPICAL

## 2019-12-16 MED ORDER — ONDANSETRON HCL 4 MG/2ML IJ SOLN
INTRAMUSCULAR | Status: DC | PRN
  Administered 2019-12-16: 4 mg via INTRAVENOUS

## 2019-12-16 MED ORDER — PROPOFOL 10 MG/ML IV BOLUS
INTRAVENOUS | Status: DC | PRN
  Administered 2019-12-16: 150 mg via INTRAVENOUS

## 2019-12-16 MED ORDER — FENTANYL CITRATE (PF) 100 MCG/2ML IJ SOLN
25.0000 ug | INTRAMUSCULAR | Status: DC | PRN
  Administered 2019-12-16 (×3): 50 ug via INTRAVENOUS

## 2019-12-16 MED ORDER — LIDOCAINE HCL (CARDIAC) PF 100 MG/5ML IV SOSY
PREFILLED_SYRINGE | INTRAVENOUS | Status: DC | PRN
  Administered 2019-12-16: 80 mg via INTRAVENOUS

## 2019-12-16 MED ORDER — HYDROMORPHONE HCL 1 MG/ML IJ SOLN
INTRAMUSCULAR | Status: AC
  Filled 2019-12-16: qty 0.5

## 2019-12-16 MED ORDER — LACTATED RINGERS IV SOLN: INTRAVENOUS | Status: DC

## 2019-12-16 MED ORDER — LIDOCAINE 2% (20 MG/ML) 5 ML SYRINGE
INTRAMUSCULAR | Status: AC
  Filled 2019-12-16: qty 5

## 2019-12-16 MED ORDER — GABAPENTIN 100 MG PO CAPS: 100.0000 mg | ORAL_CAPSULE | Freq: Three times a day (TID) | ORAL | 0 refills | Status: DC

## 2019-12-16 MED ORDER — FENTANYL CITRATE (PF) 100 MCG/2ML IJ SOLN: 50.0000 ug | INTRAMUSCULAR | Status: DC | PRN

## 2019-12-16 MED ORDER — KETOROLAC TROMETHAMINE 30 MG/ML IJ SOLN
INTRAMUSCULAR | Status: AC
  Filled 2019-12-16: qty 1

## 2019-12-16 MED ORDER — SUGAMMADEX SODIUM 200 MG/2ML IV SOLN
INTRAVENOUS | Status: DC | PRN
  Administered 2019-12-16: 160 mg via INTRAVENOUS

## 2019-12-16 MED ORDER — ACETAMINOPHEN 500 MG PO TABS: 1000.0000 mg | ORAL_TABLET | Freq: Three times a day (TID) | ORAL | 0 refills | Status: AC

## 2019-12-16 MED ORDER — CHLORHEXIDINE GLUCONATE 4 % EX LIQD: 60.0000 mL | Freq: Once | CUTANEOUS | Status: DC

## 2019-12-16 MED ORDER — PHENYLEPHRINE 40 MCG/ML (10ML) SYRINGE FOR IV PUSH (FOR BLOOD PRESSURE SUPPORT)
PREFILLED_SYRINGE | INTRAVENOUS | Status: DC | PRN
  Administered 2019-12-16: 40 ug via INTRAVENOUS
  Administered 2019-12-16: 120 ug via INTRAVENOUS

## 2019-12-16 MED ORDER — VANCOMYCIN HCL 500 MG IV SOLR
INTRAVENOUS | Status: AC
  Filled 2019-12-16: qty 500

## 2019-12-16 MED ORDER — MIDAZOLAM HCL 2 MG/2ML IJ SOLN: 1.0000 mg | INTRAMUSCULAR | Status: DC | PRN

## 2019-12-16 MED ORDER — CEFAZOLIN SODIUM-DEXTROSE 2-4 GM/100ML-% IV SOLN
2.0000 g | INTRAVENOUS | Status: AC
  Administered 2019-12-16: 2 g via INTRAVENOUS

## 2019-12-16 MED ORDER — OXYCODONE HCL 5 MG PO TABS
5.0000 mg | ORAL_TABLET | Freq: Once | ORAL | Status: AC
  Administered 2019-12-16: 5 mg via ORAL

## 2019-12-16 MED ORDER — DEXAMETHASONE SODIUM PHOSPHATE 10 MG/ML IJ SOLN
INTRAMUSCULAR | Status: DC | PRN
  Administered 2019-12-16: 6 mg via INTRAVENOUS

## 2019-12-16 MED ORDER — MIDAZOLAM HCL 2 MG/2ML IJ SOLN
INTRAMUSCULAR | Status: DC | PRN
  Administered 2019-12-16: 2 mg via INTRAVENOUS

## 2019-12-16 MED ORDER — BUPIVACAINE HCL (PF) 0.25 % IJ SOLN
INTRAMUSCULAR | Status: DC | PRN
  Administered 2019-12-16: 20 mL

## 2019-12-16 MED ORDER — MIDAZOLAM HCL 2 MG/2ML IJ SOLN
INTRAMUSCULAR | Status: AC
  Filled 2019-12-16: qty 2

## 2019-12-16 SURGICAL SUPPLY — 59 items
APL PRP STRL LF DISP 70% ISPRP (MISCELLANEOUS) ×2
BLADE SURG 10 STRL SS (BLADE) ×2 IMPLANT
BLADE SURG 15 STRL LF DISP TIS (BLADE) ×2 IMPLANT
BLADE SURG 15 STRL SS (BLADE) ×6
BNDG CMPR 9X4 STRL LF SNTH (GAUZE/BANDAGES/DRESSINGS) ×2
BNDG CMPR STD VLCR NS LF 5.8X4 (GAUZE/BANDAGES/DRESSINGS) ×2
BNDG COHESIVE 4X5 TAN STRL (GAUZE/BANDAGES/DRESSINGS) ×3 IMPLANT
BNDG ELASTIC 4X5.8 VLCR NS LF (GAUZE/BANDAGES/DRESSINGS) ×1 IMPLANT
BNDG ELASTIC 4X5.8 VLCR STR LF (GAUZE/BANDAGES/DRESSINGS) ×3 IMPLANT
BNDG ESMARK 4X9 LF (GAUZE/BANDAGES/DRESSINGS) ×3 IMPLANT
BUR EGG 3PK/BX (BURR) ×1 IMPLANT
CHLORAPREP W/TINT 26 (MISCELLANEOUS) ×3 IMPLANT
CLSR STERI-STRIP ANTIMIC 1/2X4 (GAUZE/BANDAGES/DRESSINGS) ×3 IMPLANT
COVER BACK TABLE 60X90IN (DRAPES) ×1 IMPLANT
COVER MAYO STAND STRL (DRAPES) ×1 IMPLANT
CUFF TOURN SGL QUICK 18X4 (TOURNIQUET CUFF) ×1 IMPLANT
DECANTER SPIKE VIAL GLASS SM (MISCELLANEOUS) ×1 IMPLANT
DRAPE EXTREMITY T 121X128X90 (DISPOSABLE) ×3 IMPLANT
DRAPE IMP U-DRAPE 54X76 (DRAPES) ×2 IMPLANT
DRAPE OEC MINIVIEW 54X84 (DRAPES) ×1 IMPLANT
DRAPE U-SHAPE 47X51 STRL (DRAPES) ×3 IMPLANT
DRSG AQUACEL AG ADV 3.5X 6 (GAUZE/BANDAGES/DRESSINGS) ×1 IMPLANT
DRSG PAD ABDOMINAL 8X10 ST (GAUZE/BANDAGES/DRESSINGS) ×1 IMPLANT
ELECT REM PT RETURN 9FT ADLT (ELECTROSURGICAL) ×3
ELECTRODE REM PT RTRN 9FT ADLT (ELECTROSURGICAL) ×2 IMPLANT
GAUZE SPONGE 4X4 12PLY STRL (GAUZE/BANDAGES/DRESSINGS) ×3 IMPLANT
GLOVE BIO SURGEON STRL SZ 6.5 (GLOVE) ×3 IMPLANT
GLOVE BIOGEL PI IND STRL 6.5 (GLOVE) ×2 IMPLANT
GLOVE BIOGEL PI IND STRL 7.0 (GLOVE) IMPLANT
GLOVE BIOGEL PI IND STRL 8 (GLOVE) ×2 IMPLANT
GLOVE BIOGEL PI INDICATOR 6.5 (GLOVE) ×1
GLOVE BIOGEL PI INDICATOR 7.0 (GLOVE) ×1
GLOVE BIOGEL PI INDICATOR 8 (GLOVE) ×1
GLOVE ECLIPSE 8.0 STRL XLNG CF (GLOVE) ×4 IMPLANT
GOWN STRL REUS W/ TWL LRG LVL3 (GOWN DISPOSABLE) ×2 IMPLANT
GOWN STRL REUS W/TWL LRG LVL3 (GOWN DISPOSABLE) ×3
GOWN STRL REUS W/TWL XL LVL3 (GOWN DISPOSABLE) ×3 IMPLANT
NS IRRIG 1000ML POUR BTL (IV SOLUTION) ×3 IMPLANT
PACK BASIN DAY SURGERY FS (CUSTOM PROCEDURE TRAY) ×3 IMPLANT
PAD CAST 4YDX4 CTTN HI CHSV (CAST SUPPLIES) ×2 IMPLANT
PADDING CAST COTTON 4X4 STRL (CAST SUPPLIES) ×3
PENCIL SMOKE EVACUATOR (MISCELLANEOUS) ×3 IMPLANT
SLEEVE SCD COMPRESS KNEE MED (MISCELLANEOUS) ×1 IMPLANT
SLING ARM FOAM STRAP LRG (SOFTGOODS) ×3 IMPLANT
SPLINT FAST PLASTER 5X30 (CAST SUPPLIES) ×10
SPLINT PLASTER CAST FAST 5X30 (CAST SUPPLIES) ×20 IMPLANT
STOCKINETTE 4X48 STRL (DRAPES) ×2 IMPLANT
STOCKINETTE IMPERVIOUS LG (DRAPES) ×1 IMPLANT
SUCTION FRAZIER HANDLE 10FR (MISCELLANEOUS) ×3
SUCTION TUBE FRAZIER 10FR DISP (MISCELLANEOUS) IMPLANT
SUT MNCRL AB 4-0 PS2 18 (SUTURE) ×3 IMPLANT
SUT VIC AB 0 CT1 27 (SUTURE) ×3
SUT VIC AB 0 CT1 27XBRD ANBCTR (SUTURE) ×2 IMPLANT
SUT VIC AB 3-0 SH 27 (SUTURE) ×3
SUT VIC AB 3-0 SH 27X BRD (SUTURE) IMPLANT
SYR BULB 3OZ (MISCELLANEOUS) ×3 IMPLANT
TOWEL GREEN STERILE FF (TOWEL DISPOSABLE) ×3 IMPLANT
TUBE SUCTION HIGH CAP CLEAR NV (SUCTIONS) ×1 IMPLANT
YANKAUER SUCT BULB TIP NO VENT (SUCTIONS) ×3 IMPLANT

## 2019-12-16 NOTE — Interval H&P Note (Signed)
Agree with plan. Based on patient's CT scan we do not feel that lateral epicondylitis is truly part of the diagnoses. 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 We offered Radial head resection But the patients motion With pronation and supination is relatively minimal. We will hold on this part of the procedure.

## 2019-12-16 NOTE — Anesthesia Procedure Notes (Signed)
Procedure Name: Intubation Date/Time: 12/16/2019 12:13 PM Performed by: Raenette Rover, CRNA Pre-anesthesia Checklist: Patient identified, Emergency Drugs available, Suction available and Patient being monitored Patient Re-evaluated:Patient Re-evaluated prior to induction Oxygen Delivery Method: Circle system utilized Preoxygenation: Pre-oxygenation with 100% oxygen Induction Type: IV induction Ventilation: Mask ventilation without difficulty Laryngoscope Size: Mac and 3 Grade View: Grade I Tube type: Oral Tube size: 7.0 mm Number of attempts: 1 Airway Equipment and Method: Stylet Placement Confirmation: ETT inserted through vocal cords under direct vision,  positive ETCO2 and breath sounds checked- equal and bilateral Secured at: 22 cm Tube secured with: Tape Dental Injury: Teeth and Oropharynx as per pre-operative assessment

## 2019-12-16 NOTE — Transfer of Care (Signed)
Immediate Anesthesia Transfer of Care Note  Patient: Eric Savage  Procedure(s) Performed: DEBRIDEMENT OF RIGHT ELBOW, EXCISION OSTEOPHYTES (Right Elbow) ULNAR NERVE DECOMPRESSION/TRANSPOSITION (Right Elbow)  Patient Location: PACU  Anesthesia Type:General  Level of Consciousness: awake, alert , oriented and patient cooperative  Airway & Oxygen Therapy: Patient Spontanous Breathing and Patient connected to face mask oxygen  Post-op Assessment: Report given to RN and Post -op Vital signs reviewed and stable  Post vital signs: Reviewed and stable  Last Vitals:  Vitals Value Taken Time  BP 96/66 12/16/19 1347  Temp    Pulse 59 12/16/19 1350  Resp 15 12/16/19 1350  SpO2 100 % 12/16/19 1350  Vitals shown include unvalidated device data.  Last Pain:  Vitals:   12/16/19 1009  TempSrc: Oral  PainSc: 0-No pain         Complications: No apparent anesthesia complications

## 2019-12-16 NOTE — Op Note (Signed)
Orthopaedic Surgery Operative Note (CSN: [id number])  Eric Savage  1966/01/11 Date of Surgery: 12/16/2019   Diagnoses:  Right elbow impinging osteophytes and cubital tunnel syndrome  Procedure: Right elbow open osteophyte resection and capsular release Right ulnar nerve subcutaneous transposition Open loose body removal   Operative Finding Successful completion of the planned procedure.  Patient's preoperative motion was approximately 45-125 postop-was about 5-130.  Overall were happy with the patient's motion and improvement.  Good release posterior capsular structures and coronoid process osteophytes.  Nerve decompression was without issue.  Post-operative plan: The patient will be range of motion as tolerated with a 5 pound weight limit.  The patient will be discharged home.  DVT prophylaxis not indicated in this ambulatory upper extremity patient without significant risk factors.   Pain control with PRN pain medication preferring oral medicines.  Follow up plan will be scheduled in approximately 7 days for incision check and XR.  Post-Op Diagnosis: Same Surgeons:Primary: Bjorn Pippin, MD Assistants:Caroline McBane PA-C Location: [facility] OR ROOM 5 Anesthesia: General with local anesthesia Antibiotics: Ancef 2 g with local vancomycin powder 1 g at the surgical site Tourniquet time:  Total Tourniquet Time Documented: Upper Arm (Right) - 67 minutes Total: Upper Arm (Right) - 67 minutes  Estimated Blood Loss: Minimal Complications: None Specimens: None Implants: * No implants in log *  Indications for Surgery:   Eric Savage is a 54 y.o. 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  Patient had a lengthy trial of nonoperative measures and did not do well with these.  He works a heavy labor job and though he did have radiocapitellar arthritis and pain this  seemed to be a secondary issue for him.  We talked about the fact that his surgery was primarily to treat end-stage terminal flexion extension pain as well as his ulnar nerve symptoms.  We decided in joint decision-making that radial head resection was likely not immediately in the patient's best interest secondary to his heavy labor job.  Benefits and risks of operative and nonoperative management were discussed prior to surgery with patient/guardian(s) and informed consent form was completed.  Specific risks including infection, need for additional surgery, continued pain, need for further surgeries, nerve or vessel disease, periprosthetic fracture.   Procedure:   The patient was identified properly. Informed consent was obtained and the surgical site was marked. The patient was taken up to suite where general anesthesia was induced.  The patient was positioned lateral on a beanbag with arm over ensure foot positioner.  The right elbow was prepped and draped in the usual sterile fashion.  Timeout was performed before the beginning of the case.  Tourniquet was used for the above duration.  We began with a longitudinal posterior approach to the elbow.  Went through skin sharply achieving hemostasis progressed.  We raised full-thickness skin flaps medially taking care to protect the ulnar nerve.  We identified the ulnar nerve proximally and freed it up at least 8 cm proximal to the medial condyle.  We then dissected it from proximal to distal circumferentially freeing it up for eventual transposition.  We protected the nerve throughout this dissection and were able to release it from the cubital tunnel sacrificing the branch to the articular surface but preserving the branch to the flexor carpi ulnaris.  Once we had good circumferential translation of the nerve we resected and excised the medial  antebrachial septum and carefully placed the nerve in the subcutaneous plane anterior to the medial epicondyle.   There is no tension on the nerve proximal and distal and had a appropriate straight path during elbow range of motion.  We closed a small layer of subcutaneous fat and fascia to keep the nerve from subluxating back into its groove.  This was opened enough that I was able to easily place a digit underneath the stitch so that there is no tension on the nerve.  We turned our attention to the osteophyte resection.  We discussed that the patient's primary limitation was terminal extension limitation.  We felt that an Outerbridge procedure was likely in his best interest.  We reviewed the midpoint of the triceps tendon elevating the medial lateral triceps without detaching these.  We went down to the tip of the olecranon as well as the olecranon fossa and these were identified and protected with blunt retractors.  That point were able to identify significant osteophytes within the olecranon fossa as well as at the tip of the olecranon.  We resected about a 5 to 7 mm section of the olecranon.  At that point we used a series of Kerrison and pituitary rongeurs and osteotomes to resect the posterior osteophytes from the olecranon fossa.  We then used fluoroscopic guidance to guide our osteotomy through the olecranon fossa using a Savage and careful dissection to avoid plunging.  We able to open the olecranon fossa and carefully step-by-step proceed through the anterior osteophyte.  Once we had a small perforation in the osteophyte and could verify that we were within the capsule of the anterior elbow we then used a Kerrison to slowly remove osteophytes avoiding damage to the capsule and thus protecting the anterior structures.  Were able to resect osteophytes that were impinging on the coronoid from the olecranon fossa and were able to visualize the coronoid through our osteotomy and resect the tip of this as well.  We are overall happy with our release and removal.  During range of motion we did note 2 loose bodies which were  resected during the procedure.  Final fluoroscopic images did demonstrate no sign of fracture, osteotomy was in appropriate position and successful resection of the osteophytes was noted.  We irrigated the wound copiously before placing local antibiotic as listed above.  Close the incision in a multilayer fashion with absorbable suture.  Sterile dressing was placed.  Patient was awoken taken to PACU in stable condition.  Noemi Chapel, PA-C, present and scrubbed throughout the case, critical for completion in a timely fashion, and for retraction, instrumentation, closure.

## 2019-12-16 NOTE — Discharge Instructions (Signed)
No NSAIDs  (Ibuprofen Advil, etc. before 7:15pm today).  Post Anesthesia Home Care Instructions  Activity: Get plenty of rest for the remainder of the day. A responsible individual must stay with you for 24 hours following the procedure.  For the next 24 hours, DO NOT: -Drive a car -Advertising copywriter -Drink alcoholic beverages -Take any medication unless instructed by your physician -Make any legal decisions or sign important papers.  Meals: Start with liquid foods such as gelatin or soup. Progress to regular foods as tolerated. Avoid greasy, spicy, heavy foods. If nausea and/or vomiting occur, drink only clear liquids until the nausea and/or vomiting subsides. Call your physician if vomiting continues.  Special Instructions/Symptoms: Your throat may feel dry or sore from the anesthesia or the breathing tube placed in your throat during surgery. If this causes discomfort, gargle with warm salt water. The discomfort should disappear within 24 hours.  If you had a scopolamine patch placed behind your ear for the management of post- operative nausea and/or vomiting:  1. The medication in the patch is effective for 72 hours, after which it should be removed.  Wrap patch in a tissue and discard in the trash. Wash hands thoroughly with soap and water. 2. You may remove the patch earlier than 72 hours if you experience unpleasant side effects which may include dry mouth, dizziness or visual disturbances. 3. Avoid touching the patch. Wash your hands with soap and water after contact with the patch.

## 2019-12-16 NOTE — Anesthesia Preprocedure Evaluation (Signed)
Anesthesia Evaluation  Patient identified by MRN, date of birth, ID band Patient awake    Reviewed: Allergy & Precautions, NPO status , Patient's Chart, lab work & pertinent test results, reviewed documented beta blocker date and time   Airway Mallampati: II       Dental no notable dental hx. (+) Teeth Intact   Pulmonary Current Smoker and Patient abstained from smoking.,    Pulmonary exam normal breath sounds clear to auscultation       Cardiovascular hypertension, Pt. on medications and Pt. on home beta blockers + CAD  Normal cardiovascular exam+ dysrhythmias Atrial Fibrillation  Rhythm:Regular Rate:Normal  Myocardial perfusion 08/28/19  The left ventricular ejection fraction is normal (55-65%).  Nuclear stress EF: 56%.  There was no ST segment deviation noted during stress.  The study is normal.  This is a low risk study.   EKG- Sinus Bradycardia   Neuro/Psych PSYCHIATRIC DISORDERS Anxiety negative neurological ROS     GI/Hepatic Neg liver ROS, GERD  Medicated and Controlled,Hx/o diverticulitis S/P sigmoid colectomy   Endo/Other  Hypercholesterolemia  Renal/GU Renal diseaseHx/o renal calculi  negative genitourinary   Musculoskeletal  (+) Arthritis , Osteoarthritis,  Loose body right elbow Epicondylitis right elbow   Abdominal   Peds  Hematology  (+) Blood dyscrasia, , Xarelto therapy- last dose 12/13/2019   Anesthesia Other Findings   Reproductive/Obstetrics                             Anesthesia Physical Anesthesia Plan  ASA: III  Anesthesia Plan: General   Post-op Pain Management:    Induction: Intravenous  PONV Risk Score and Plan: 2 and Ondansetron, Dexamethasone and Treatment may vary due to age or medical condition  Airway Management Planned: Oral ETT and LMA  Additional Equipment:   Intra-op Plan:   Post-operative Plan: Extubation in OR  Informed Consent:  I have reviewed the patients History and Physical, chart, labs and discussed the procedure including the risks, benefits and alternatives for the proposed anesthesia with the patient or authorized representative who has indicated his/her understanding and acceptance.     Dental advisory given  Plan Discussed with: CRNA  Anesthesia Plan Comments:         Anesthesia Quick Evaluation

## 2019-12-16 NOTE — Anesthesia Postprocedure Evaluation (Signed)
Anesthesia Post Note  Patient: Eric Savage  Procedure(s) Performed: DEBRIDEMENT OF RIGHT ELBOW, EXCISION OSTEOPHYTES (Right Elbow) ULNAR NERVE DECOMPRESSION/TRANSPOSITION (Right Elbow)     Patient location during evaluation: PACU Anesthesia Type: General Level of consciousness: sedated and patient cooperative Pain management: pain level controlled Vital Signs Assessment: post-procedure vital signs reviewed and stable Respiratory status: spontaneous breathing Cardiovascular status: stable Anesthetic complications: no    Last Vitals:  Vitals:   12/16/19 1445 12/16/19 1500  BP: 122/74 129/79  Pulse: (!) 56 (!) 53  Resp: 13 17  Temp:    SpO2: 97% 98%    Last Pain:  Vitals:   12/16/19 1500  TempSrc:   PainSc: 5                  Lewie Loron

## 2019-12-17 ENCOUNTER — Encounter: Payer: Self-pay | Admitting: *Deleted

## 2019-12-29 ENCOUNTER — Encounter: Payer: Self-pay | Admitting: Cardiology

## 2020-01-01 ENCOUNTER — Other Ambulatory Visit: Payer: Self-pay

## 2020-01-01 ENCOUNTER — Ambulatory Visit (HOSPITAL_BASED_OUTPATIENT_CLINIC_OR_DEPARTMENT_OTHER): Payer: 59 | Attending: Cardiology | Admitting: Cardiovascular Disease

## 2020-01-02 ENCOUNTER — Other Ambulatory Visit (HOSPITAL_COMMUNITY): Payer: Self-pay | Admitting: Nurse Practitioner

## 2020-01-31 ENCOUNTER — Other Ambulatory Visit: Payer: Self-pay | Admitting: Cardiology

## 2020-04-13 ENCOUNTER — Other Ambulatory Visit: Payer: Self-pay | Admitting: Physician Assistant

## 2020-04-13 DIAGNOSIS — R0989 Other specified symptoms and signs involving the circulatory and respiratory systems: Secondary | ICD-10-CM

## 2020-04-21 ENCOUNTER — Other Ambulatory Visit: Payer: Self-pay | Admitting: Orthopaedic Surgery

## 2020-04-21 DIAGNOSIS — S42402K Unspecified fracture of lower end of left humerus, subsequent encounter for fracture with nonunion: Secondary | ICD-10-CM

## 2020-04-21 DIAGNOSIS — M25522 Pain in left elbow: Secondary | ICD-10-CM

## 2020-04-25 ENCOUNTER — Ambulatory Visit
Admission: RE | Admit: 2020-04-25 | Discharge: 2020-04-25 | Disposition: A | Discharge status: Home / Self Care | Payer: 59 | Source: Ambulatory Visit | Attending: Physician Assistant | Admitting: Physician Assistant

## 2020-04-25 DIAGNOSIS — R0989 Other specified symptoms and signs involving the circulatory and respiratory systems: Secondary | ICD-10-CM

## 2020-04-29 ENCOUNTER — Other Ambulatory Visit: Payer: Self-pay | Admitting: Cardiology

## 2020-05-06 ENCOUNTER — Inpatient Hospital Stay: Admission: RE | Admit: 2020-05-06 | Payer: 59 | Source: Ambulatory Visit

## 2020-05-06 ENCOUNTER — Other Ambulatory Visit: Payer: 59

## 2020-05-20 ENCOUNTER — Ambulatory Visit
Admission: RE | Admit: 2020-05-20 | Discharge: 2020-05-20 | Disposition: A | Discharge status: Home / Self Care | Payer: 59 | Source: Ambulatory Visit | Attending: Orthopaedic Surgery | Admitting: Orthopaedic Surgery

## 2020-05-20 DIAGNOSIS — M25522 Pain in left elbow: Secondary | ICD-10-CM

## 2020-05-20 DIAGNOSIS — S42402K Unspecified fracture of lower end of left humerus, subsequent encounter for fracture with nonunion: Secondary | ICD-10-CM

## 2020-05-30 ENCOUNTER — Other Ambulatory Visit (HOSPITAL_COMMUNITY): Payer: Self-pay | Admitting: Nurse Practitioner

## 2020-06-22 ENCOUNTER — Other Ambulatory Visit: Payer: Self-pay | Admitting: Urology

## 2020-06-22 ENCOUNTER — Other Ambulatory Visit: Payer: Self-pay

## 2020-06-22 ENCOUNTER — Emergency Department (HOSPITAL_COMMUNITY)
Admission: EM | Admit: 2020-06-22 | Discharge: 2020-06-22 | Disposition: A | Discharge status: Home / Self Care | Payer: 59 | Attending: Emergency Medicine | Admitting: Emergency Medicine

## 2020-06-22 ENCOUNTER — Telehealth: Payer: Self-pay | Admitting: Cardiology

## 2020-06-22 ENCOUNTER — Emergency Department (HOSPITAL_COMMUNITY): Payer: 59

## 2020-06-22 ENCOUNTER — Encounter (HOSPITAL_COMMUNITY): Payer: Self-pay

## 2020-06-22 DIAGNOSIS — I119 Hypertensive heart disease without heart failure: Secondary | ICD-10-CM | POA: Diagnosis not present

## 2020-06-22 DIAGNOSIS — F17210 Nicotine dependence, cigarettes, uncomplicated: Secondary | ICD-10-CM | POA: Diagnosis not present

## 2020-06-22 DIAGNOSIS — Z79899 Other long term (current) drug therapy: Secondary | ICD-10-CM | POA: Diagnosis not present

## 2020-06-22 DIAGNOSIS — I4891 Unspecified atrial fibrillation: Secondary | ICD-10-CM | POA: Insufficient documentation

## 2020-06-22 DIAGNOSIS — Z7982 Long term (current) use of aspirin: Secondary | ICD-10-CM | POA: Insufficient documentation

## 2020-06-22 DIAGNOSIS — I2510 Atherosclerotic heart disease of native coronary artery without angina pectoris: Secondary | ICD-10-CM | POA: Diagnosis not present

## 2020-06-22 LAB — CBC WITH DIFFERENTIAL/PLATELET
Abs Immature Granulocytes: 0.04 10*3/uL (ref 0.00–0.07)
Basophils Absolute: 0 10*3/uL (ref 0.0–0.1)
Basophils Relative: 0 %
Eosinophils Absolute: 0.1 10*3/uL (ref 0.0–0.5)
Eosinophils Relative: 1 %
HCT: 45.8 % (ref 39.0–52.0)
Hemoglobin: 15.7 g/dL (ref 13.0–17.0)
Immature Granulocytes: 0 %
Lymphocytes Relative: 21 %
Lymphs Abs: 2.6 10*3/uL (ref 0.7–4.0)
MCH: 31 pg (ref 26.0–34.0)
MCHC: 34.3 g/dL (ref 30.0–36.0)
MCV: 90.3 fL (ref 80.0–100.0)
Monocytes Absolute: 0.8 10*3/uL (ref 0.1–1.0)
Monocytes Relative: 6 %
Neutro Abs: 8.7 10*3/uL — ABNORMAL HIGH (ref 1.7–7.7)
Neutrophils Relative %: 72 %
Platelets: 228 10*3/uL (ref 150–400)
RBC: 5.07 MIL/uL (ref 4.22–5.81)
RDW: 12.3 % (ref 11.5–15.5)
WBC: 12.2 10*3/uL — ABNORMAL HIGH (ref 4.0–10.5)
nRBC: 0 % (ref 0.0–0.2)

## 2020-06-22 LAB — URINALYSIS, ROUTINE W REFLEX MICROSCOPIC
Bacteria, UA: NONE SEEN
Bilirubin Urine: NEGATIVE
Glucose, UA: NEGATIVE mg/dL
Ketones, ur: NEGATIVE mg/dL
Leukocytes,Ua: NEGATIVE
Nitrite: NEGATIVE
Protein, ur: NEGATIVE mg/dL
Specific Gravity, Urine: 1.003 — ABNORMAL LOW (ref 1.005–1.030)
pH: 6 (ref 5.0–8.0)

## 2020-06-22 LAB — COMPREHENSIVE METABOLIC PANEL
ALT: 16 U/L (ref 0–44)
AST: 18 U/L (ref 15–41)
Albumin: 4.6 g/dL (ref 3.5–5.0)
Alkaline Phosphatase: 89 U/L (ref 38–126)
Anion gap: 13 (ref 5–15)
BUN: 13 mg/dL (ref 6–20)
CO2: 22 mmol/L (ref 22–32)
Calcium: 9.7 mg/dL (ref 8.9–10.3)
Chloride: 103 mmol/L (ref 98–111)
Creatinine, Ser: 1.2 mg/dL (ref 0.61–1.24)
GFR calc Af Amer: >60 mL/min (ref >60)
GFR calc non Af Amer: >60 mL/min (ref >60)
Glucose, Bld: 118 mg/dL — ABNORMAL HIGH (ref 70–99)
Potassium: 4 mmol/L (ref 3.5–5.1)
Sodium: 138 mmol/L (ref 135–145)
Total Bilirubin: 1.3 mg/dL — ABNORMAL HIGH (ref 0.3–1.2)
Total Protein: 7.3 g/dL (ref 6.5–8.1)

## 2020-06-22 LAB — MAGNESIUM: Magnesium: 1.6 mg/dL — ABNORMAL LOW (ref 1.7–2.4)

## 2020-06-22 LAB — TROPONIN I (HIGH SENSITIVITY): Troponin I (High Sensitivity): 3 ng/L (ref <18)

## 2020-06-22 MED ORDER — MAGNESIUM SULFATE 2 GM/50ML IV SOLN
2.0000 g | Freq: Once | INTRAVENOUS | Status: AC
  Administered 2020-06-22: 2 g via INTRAVENOUS
  Filled 2020-06-22: qty 50

## 2020-06-22 MED ORDER — SODIUM CHLORIDE 0.9 % IV SOLN
1.0000 g | Freq: Once | INTRAVENOUS | Status: AC
  Administered 2020-06-22: 1 g via INTRAVENOUS
  Filled 2020-06-22: qty 10

## 2020-06-22 MED ORDER — METOPROLOL TARTRATE 5 MG/5ML IV SOLN
INTRAVENOUS | Status: AC
  Administered 2020-06-22: 2.5 mg via INTRAVENOUS
  Filled 2020-06-22: qty 5

## 2020-06-22 MED ORDER — CEPHALEXIN 500 MG PO CAPS: 500.0000 mg | ORAL_CAPSULE | Freq: Four times a day (QID) | ORAL | 0 refills | Status: DC

## 2020-06-22 MED ORDER — LORAZEPAM 2 MG/ML IJ SOLN
1.0000 mg | Freq: Once | INTRAMUSCULAR | Status: AC
  Administered 2020-06-22: 1 mg via INTRAVENOUS
  Filled 2020-06-22: qty 1

## 2020-06-22 MED ORDER — METOPROLOL TARTRATE 5 MG/5ML IV SOLN: 2.5000 mg | Freq: Once | INTRAVENOUS | Status: AC

## 2020-06-22 MED ORDER — METOPROLOL TARTRATE 5 MG/5ML IV SOLN
2.5000 mg | Freq: Once | INTRAVENOUS | Status: AC
  Administered 2020-06-22: 2.5 mg via INTRAVENOUS
  Filled 2020-06-22: qty 5

## 2020-06-22 MED ORDER — METOPROLOL TARTRATE 5 MG/5ML IV SOLN: 2.5000 mg | Freq: Once | INTRAVENOUS | Status: DC

## 2020-06-22 MED ORDER — METOPROLOL TARTRATE 5 MG/5ML IV SOLN
2.5000 mg | Freq: Once | INTRAVENOUS | Status: AC
  Administered 2020-06-22: 2.5 mg via INTRAVENOUS

## 2020-06-22 NOTE — ED Triage Notes (Signed)
Pt states that he was having lunch today and went into afib. Pt brought immediately to resus a. Pt HR in 170s

## 2020-06-22 NOTE — Discharge Instructions (Addendum)
Stop taking your ciprofloxacin. Start taking the cephalexin(keflex). Get rechecked immediately if you develop fevers, uncontrolled pain or new concerning symptoms.

## 2020-06-22 NOTE — ED Provider Notes (Signed)
[facility]-EMERGENCY DEPT Provider Note   CSN: [id number] Arrival date & time: 06/22/20  1242     History Chief Complaint  Patient presents with  . Atrial Fibrillation    Eric BurDonald J Lamora is a 54 y.o. male.  The history is provided by the patient. No language interpreter was used.  Atrial Fibrillation   Eric Savage is a 54 y.o. male who presents to the Emergency Department complaining of CP/afib. He has a hx/o afib and presents to the ED complaining of chest pain and afib that began about one hour prior to ED arrival when he was eating. He has experienced similar sxs in the past secondary to afib with RVR. He has mild nausea, dysuria due to kidney stone. He was started on cipro two days ago.  He last took his home meds last night.  He occasionally misses doses of his home meds. Sxs are severe and constant in nature.      Past Medical History:  Diagnosis Date  . Acid reflux   . Anxiety   . Arthritis   . CAD S/P percutaneous coronary angioplasty 12/10   RCA/CFX DES  . Chest pain 01/2013   Evaluate cardiac catheterization, no obstructive CAD  . Diverticulitis   . Dysrhythmia    OCC PALPITATION   . High cholesterol   . History of kidney stones   . Hypertension     Patient Active Problem List   Diagnosis Date Noted  . PAF (paroxysmal atrial fibrillation) (HCC); CHA2DS2-VASc Score 2 08/24/2019  . S/P colectomy 06/05/2017  . Preoperative cardiovascular examination 12/06/2016  . Diverticulitis large intestine w/o perforation or abscess w/o bleeding   . Diverticulitis 09/08/2016  . Obesity (BMI 30-39.9) 04/26/2014  . Tobacco abuse counseling 04/12/2013  . Non-compliance with treatment 01/28/2013  . Essential hypertension 05/16/2012  . Dyslipidemia, goal LDL below 70 05/16/2012  . Smoker 05/16/2012  . CAD, RCA/CFX DES 12/10 with residual 50-60% LAD.- Cath 05/19/12- and 01/28/13 no ISR- med Rx 09/15/2009    Past Surgical History:  Procedure  Laterality Date  . CHOLECYSTECTOMY N/A 08/04/2018   Procedure: LAPAROSCOPIC CHOLECYSTECTOMY;  Surgeon: Violeta Gelinashompson, Burke, MD;  [facility];  Service: General;  Laterality: N/A;  . COLONOSCOPY    . CORONARY ANGIOPLASTY WITH STENT PLACEMENT  09/2009   Proximal RCA (95%-60%) - Cypher DES 3.0 mm but 33 mm --> 3.75 mm; pCFX 80%: PCI - XIENCE V. DES 2.75 mm x 15 mm --> 3.0 mm  . LAPAROSCOPIC SIGMOID COLECTOMY N/A 06/05/2017   Procedure: SIGMOID COLECTOMY;  Surgeon: Violeta Gelinashompson, Burke, MD;  [facility];  Service: General;  Laterality: N/A;  . LEFT HEART CATHETERIZATION WITH CORONARY ANGIOGRAM N/A 05/19/2012   Procedure: LEFT HEART CATHETERIZATION WITH CORONARY ANGIOGRAM;  Surgeon: Marykay Lexavid W [name], MD;  [facility];::  Cx & RCA stents patent. Moderate D1 & D2 as well as ostial AVG Cx.    Marland Kitchen. LEFT HEART CATHETERIZATION WITH CORONARY ANGIOGRAM N/A 01/28/2013   Procedure: LEFT HEART CATHETERIZATION WITH CORONARY ANGIOGRAM;  Surgeon: Lennette Biharihomas A Kelly, MD;  [facility];  Service: Cardiovascular:  No evidence for restenosis of either RCA or LCX. Mild LAD 20% narrowing and 60 - 70% smooth mid diagonal 2 stenosis.  Marland Kitchen. NM MYOCAR PERF WALL MOTION  01/04/2011   Protocol:Bruce, post stress EF=69%, Exercise Cap 13 METS, attenuation defect in inferior region of myocardium, no sig. ischemia demonstrated  . TENNIS ELBOW RELEASE/NIRSCHEL PROCEDURE Right 12/16/2019   Procedure: DEBRIDEMENT OF  RIGHT ELBOW, EXCISION OSTEOPHYTES;  Surgeon: Bjorn Pippin, MD;  Location: [facility];  Service: Orthopedics;  Laterality: Right;  . TONSILLECTOMY  ~ 1973  . TRANSTHORACIC ECHOCARDIOGRAM  05/13/2009   EF =>55%, mild mitral regurg, trace tricuspid regurg.  Marland Kitchen ULNAR NERVE TRANSPOSITION Right 12/16/2019   Procedure: ULNAR NERVE DECOMPRESSION/TRANSPOSITION;  Surgeon: Bjorn Pippin, MD;  Location: [facility];  Service: Orthopedics;  Laterality: Right;       Family History  Problem Relation  Age of Onset  . CAD Father 7       CABG    Social History   Tobacco Use  . Smoking status: Current Every Day Smoker    Packs/day: 0.25    Years: 30.00    Pack years: 7.50    Types: Cigarettes  . Smokeless tobacco: Never Used  Vaping Use  . Vaping Use: Never used  Substance Use Topics  . Alcohol use: Yes    Alcohol/week: 8.0 - 10.0 standard drinks    Types: 8 - 10 Shots of liquor per week    Comment: twice a week  . Drug use: No    Frequency: 7.0 times per week    Types: Marijuana, LSD    Comment: 01/28/2013 last drug use ">10 yr ago"; had reported marijuana use several times a week    Home Medications Prior to Admission medications   Medication Sig Start Date End Date Taking? Authorizing Provider  ALPRAZolam Prudy Feeler) 0.5 MG tablet Take 0.5 mg by mouth as needed for anxiety.    [provider]  aspirin EC 81 MG tablet Take 81 mg by mouth at bedtime.    [provider]  atorvastatin (LIPITOR) 40 MG tablet TAKE 1 TABLET BY MOUTH EVERY DAY Patient taking differently: Take 40 mg by mouth at bedtime.  09/16/19   Marykay Lex, MD  cephALEXin (KEFLEX) 500 MG capsule Take 1 capsule (500 mg total) by mouth 4 (four) times daily. 06/22/20   Tilden Fossa, MD  gabapentin (NEURONTIN) 100 MG capsule Take 1 capsule (100 mg total) by mouth 3 (three) times daily for 14 days. For pain. 12/16/19 12/30/19  Vernetta Honey, PA-C  lisinopril (ZESTRIL) 20 MG tablet TAKE 1 TABLET BY MOUTH EVERY DAY 04/29/20   Marykay Lex, MD  metoprolol tartrate (LOPRESSOR) 25 MG tablet TAKE 1/2 TABLET (12.5 MG TOTAL) BY MOUTH 2 (TWO) TIMES DAILY. 05/30/20   Newman Nip, NP  omeprazole (PRILOSEC) 40 MG capsule Take 40 mg by mouth at bedtime.     [provider]  rivaroxaban (XARELTO) 20 MG TABS tablet Take 1 tablet (20 mg total) by mouth daily with supper. Patient taking differently: Take 20 mg by mouth at bedtime.  09/16/19   Newman Nip, NP    Allergies    Patient has  no known allergies.  Review of Systems   Review of Systems  All other systems reviewed and are negative.   Physical Exam Updated Vital Signs BP 109/73   Pulse (!) 59   Temp 98.2 F (36.8 C) (Oral)   Resp (!) 28   SpO2 92%   Physical Exam Vitals and nursing note reviewed.  Constitutional:      Appearance: He is well-developed.  HENT:     Head: Normocephalic and atraumatic.  Cardiovascular:     Rate and Rhythm: Tachycardia present. Rhythm irregular.     Heart sounds: No murmur heard.   Pulmonary:     Effort: Pulmonary effort  is normal. No respiratory distress.     Breath sounds: Normal breath sounds.  Abdominal:     Palpations: Abdomen is soft.     Tenderness: There is no abdominal tenderness. There is no guarding or rebound.  Musculoskeletal:        General: No swelling or tenderness.  Skin:    General: Skin is warm and dry.  Neurological:     Mental Status: He is alert and oriented to person, place, and time.  Psychiatric:        Behavior: Behavior normal.     ED Results / Procedures / Treatments   Labs (all labs ordered are listed, but only abnormal results are displayed) Labs Reviewed  COMPREHENSIVE METABOLIC PANEL - Abnormal; Notable for the following components:      Result Value   Glucose, Bld 118 (*)    Total Bilirubin 1.3 (*)    All other components within normal limits  CBC WITH DIFFERENTIAL/PLATELET - Abnormal; Notable for the following components:   WBC 12.2 (*)    Neutro Abs 8.7 (*)    All other components within normal limits  MAGNESIUM - Abnormal; Notable for the following components:   Magnesium 1.6 (*)    All other components within normal limits  URINALYSIS, ROUTINE W REFLEX MICROSCOPIC - Abnormal; Notable for the following components:   Color, Urine COLORLESS (*)    Specific Gravity, Urine 1.003 (*)    Hgb urine dipstick MODERATE (*)    All other components within normal limits  URINE CULTURE  TROPONIN I (HIGH SENSITIVITY)     EKG EKG Interpretation  Date/Time:  Wednesday June 22 2020 12:45:13 EDT Ventricular Rate:  172 PR Interval:    QRS Duration: 80 QT Interval:  278 QTC Calculation: 470 R Axis:   77 Text Interpretation: Atrial fibrillation with rapid ventricular response ST & T wave abnormality, consider inferolateral ischemia Abnormal ECG Confirmed by Tilden Fossa [phone]) on 06/22/2020 12:54:09 PM   Radiology DG Chest Port 1 View  Result Date: 06/22/2020 CLINICAL DATA:  Chest pain. EXAM: PORTABLE CHEST 1 VIEW COMPARISON:  November 03, 2019. FINDINGS: The heart size and mediastinal contours are within normal limits. Both lungs are clear. No pneumothorax or pleural effusion is noted. The visualized skeletal structures are unremarkable. IMPRESSION: No active disease. Electronically Signed   By: Lupita Raider M.D.   On: 06/22/2020 13:29    Procedures Procedures (including critical care time) CRITICAL CARE Performed by: Tilden Fossa   Total critical care time: 35 minutes  Critical care time was exclusive of separately billable procedures and treating other patients.  Critical care was necessary to treat or prevent imminent or life-threatening deterioration.  Critical care was time spent personally by me on the following activities: development of treatment plan with patient and/or surrogate as well as nursing, discussions with consultants, evaluation of patient's response to treatment, examination of patient, obtaining history from patient or surrogate, ordering and performing treatments and interventions, ordering and review of laboratory studies, ordering and review of radiographic studies, pulse oximetry and re-evaluation of patient's condition.  Medications Ordered in ED Medications  cefTRIAXone (ROCEPHIN) 1 g in sodium chloride 0.9 % 100 mL IVPB (has no administration in time range)  metoprolol tartrate (LOPRESSOR) injection 2.5 mg (2.5 mg Intravenous Given by Other 06/22/20 1302)   metoprolol tartrate (LOPRESSOR) injection 2.5 mg (2.5 mg Intravenous Given 06/22/20 1304)  metoprolol tartrate (LOPRESSOR) injection 2.5 mg (2.5 mg Intravenous Given 06/22/20 1412)  LORazepam (ATIVAN) injection 1 mg (  1 mg Intravenous Given 06/22/20 1339)  magnesium sulfate IVPB 2 g 50 mL (0 g Intravenous Stopped 06/22/20 1452)    ED Course  I have reviewed the triage vital signs and the nursing notes.  Pertinent labs & imaging results that were available during my care of the patient were reviewed by me and considered in my medical decision making (see chart for details).    MDM Rules/Calculators/A&P                         Patient with history of atrial fibrillation here for evaluation of palpitations and is in a fib with RVR on ED presentation. He converted to sinus rhythm after IV metoprolol 2.5 mg times three. On assessment following conversion he is feeling improved. He was hypomagnesemic and this was replaced. He was recently started on Cipro by urology for a 10 day course. He is non-toxic and non-septic appearing. UA not consistent with UTI. Discussed with Dr. Laverle Patter with urology, will change Cipro to Keflex pending culture data due to increased risk of afib with cipro. Discussed with patient importance of medication compliance, outpatient follow up and return precautions.     CHA2DS2/VAS Stroke Risk Points  Current as of just now     2 >= 2 Points: High Risk  1 - 1.99 Points: Medium Risk  0 Points: Low Risk    No Change      Details    This score determines the patient's risk of having a stroke if the  patient has atrial fibrillation.       Points Metrics  0 Has Congestive Heart Failure:  No    Current as of just now  1 Has Vascular Disease:  Yes    Current as of just now  1 Has Hypertension:  Yes    Current as of just now  0 Age:  66    Current as of just now  0 Has Diabetes:  No    Current as of just now  0 Had Stroke:  No  Had TIA:  No  Had thromboembolism:  No     Current as of just now  0 Male:  No    Current as of just now            Final Clinical Impression(s) / ED Diagnoses Final diagnoses:  Atrial fibrillation with RVR (HCC)  Hypomagnesemia    Rx / DC Orders ED Discharge Orders         Ordered    cephALEXin (KEFLEX) 500 MG capsule  4 times daily        06/22/20 1520           Tilden Fossa, MD 06/22/20 1523

## 2020-06-22 NOTE — Telephone Encounter (Signed)
Patient with diagnosis of PAF on Xarelto for anticoagulation.    Procedure: lithotripsy Date of procedure: 06/27/20  CHADS2-VASc score of  2 (HTN, CAD)  CrCl 75 mL/min Platelet count 222K  Per office protocol, patient can hold Xarelto for 2-3 days prior to procedure.   Patient will not need bridging with Lovenox (enoxaparin) around procedure.  If not bridging, patient should restart Xarelto on the evening of procedure or day after, at discretion of procedure MD

## 2020-06-22 NOTE — Telephone Encounter (Signed)
   [facility] Pre-operative Risk Assessment    HEARTCARE STAFF: - Please ensure there is not already an duplicate clearance open for this procedure. - Under Visit Info/Reason for Call, type in Other and utilize the format Clearance MM/DD/YY or Clearance TBD. Do not use dashes or single digits. - If request is for dental extraction, please clarify the # of teeth to be extracted.  Request for surgical clearance:  1. What type of surgery is being performed? lithotripsy  2. When is this surgery scheduled? 06/27/20   3. What type of clearance is required (medical clearance vs. Pharmacy clearance to hold med vs. Both)? both  4. Are there any medications that need to be held prior to surgery and how long? Xarelto 3 days prior   5. Practice name and name of physician performing surgery? Dr. Jeffie Pollock at [facility]   6. What is the office phone number? [place]   7.   What is the office fax number? [phone]  8.   Anesthesia type (None, local, MAC, general) ? local   Eric Savage 06/22/2020, 10:03 AM  _________________________________________________________________   (provider comments below)

## 2020-06-22 NOTE — Telephone Encounter (Signed)
   Primary Cardiologist: [name], MD  Chart reviewed as part of pre-operative protocol coverage. Patient was contacted 06/22/2020 in reference to pre-operative risk assessment for pending surgery as outlined below.  DARRAGH NAY was last seen on 11/04/19 by Rudi Coco, NP.  Since that day, ELIGHA KMETZ has done fine from a cardiac standpoint. He reports a couple brief episodes of atrial fibrillation over the past several months which he identifies as palpitations with associated chest discomfort. He has no complaints of exertional chest pain or SOB. He can easily complete 4 METs without anginal complaints  Therefore, based on ACC/AHA guidelines, the patient would be at acceptable risk for the planned procedure without further cardiovascular testing.   The patient was advised that if he develops new symptoms prior to surgery to contact our office to arrange for a follow-up visit, and he verbalized understanding.  Per pharmacy recommendations, patient can hold xarelto 2-3 days prior to his upcoming procedure with plans to restart as soon as he is cleared to do so by his urologist.   I will route this recommendation to the requesting party via Epic fax function and remove from pre-op pool. Please call with questions.  Beatriz Stallion, PA-C 06/22/2020, 2:19 PM

## 2020-06-23 ENCOUNTER — Other Ambulatory Visit (HOSPITAL_COMMUNITY)
Admission: RE | Admit: 2020-06-23 | Discharge: 2020-06-23 | Disposition: A | Discharge status: Home / Self Care | Payer: 59 | Source: Ambulatory Visit | Attending: Urology | Admitting: Urology

## 2020-06-23 ENCOUNTER — Telehealth: Payer: Self-pay

## 2020-06-23 ENCOUNTER — Encounter (HOSPITAL_BASED_OUTPATIENT_CLINIC_OR_DEPARTMENT_OTHER): Payer: Self-pay | Admitting: Urology

## 2020-06-23 ENCOUNTER — Telehealth: Payer: Self-pay | Admitting: Cardiology

## 2020-06-23 ENCOUNTER — Other Ambulatory Visit: Payer: Self-pay | Admitting: Urology

## 2020-06-23 ENCOUNTER — Other Ambulatory Visit (HOSPITAL_COMMUNITY): Admission: RE | Admit: 2020-06-23 | Payer: 59 | Source: Ambulatory Visit

## 2020-06-23 DIAGNOSIS — Z01812 Encounter for preprocedural laboratory examination: Secondary | ICD-10-CM | POA: Diagnosis present

## 2020-06-23 DIAGNOSIS — Z20822 Contact with and (suspected) exposure to covid-19: Secondary | ICD-10-CM | POA: Insufficient documentation

## 2020-06-23 LAB — URINE CULTURE: Culture: <10000 — AB

## 2020-06-23 LAB — SARS CORONAVIRUS 2 (TAT 6-24 HRS): SARS Coronavirus 2: NEGATIVE

## 2020-06-23 MED ORDER — SULFAMETHOXAZOLE-TRIMETHOPRIM 800-160 MG PO TABS: 1.0000 | ORAL_TABLET | Freq: Two times a day (BID) | ORAL | Status: DC

## 2020-06-23 NOTE — Telephone Encounter (Signed)
Received a call from [facility] at Dr.Wrenn's office.She stated patient is scheduled to have Lithotripsy next week.He is suppose to hold Xarelto starting today.Stated he had a episode of Afib with RVR yesterday and was seen in [facility] ED.She is concerned if ok to hold Xarelto.Advised Dr.[name] is out of office today.She will reschedule Lithotripsy to 9/27.Advised I will send message to Dr.[name] for advice if safe to hold Xarelto for 3 days prior to Lithotripsy.

## 2020-06-23 NOTE — Progress Notes (Signed)
Patient to arrive at 0800 06/27/2020. History and medications reviewed. Pre-procedure instructions given. Instructed to stop xarelto and ASA 72 hours prior to procedure. NPO after MN. BP medication with sip of water in am of ESWL. Driver secured.

## 2020-06-23 NOTE — Telephone Encounter (Signed)
[name] from Dr. Belva Crome office stated that patient was seen in ER last night. Pt just got clearance to have surgery but since he went to ER yesterday, office wanted to know if it is still ok to hold the Xarelto. Please call to discuss

## 2020-06-23 NOTE — Telephone Encounter (Signed)
Xarelto should only need to be held 24 to 48 hours for procedures.  Does not need to be held 3 days. I agree though if she had an episode of A. fib RVR just yesterday, it would be nice to wait a few weeks before holding Xarelto.  [name]

## 2020-06-23 NOTE — Telephone Encounter (Signed)
   Reviewed Afib episode from yesterday. Suspect in part this is driven by his underlying infection and there was some concern that his antibiotic had a pro-arrhythmic effect. As pharmacy has stated, his bleeding risk remains the same. Favor proceeding this his surgery as scheduled which will help resolve his underlying infection issue. This was relayed to [facility] in the Urology office.   Beatriz Stallion, PA-C 06/23/20; 1:36 PM

## 2020-06-23 NOTE — Telephone Encounter (Signed)
Patient was anticoagulated during A.Fib episode and Bleeding risk remains the same, but will forward message to pre-op pool for further assessment by PA.

## 2020-06-24 ENCOUNTER — Other Ambulatory Visit: Payer: Self-pay | Admitting: Urology

## 2020-06-24 MED ORDER — SULFAMETHOXAZOLE-TRIMETHOPRIM 800-160 MG PO TABS: 1.0000 | ORAL_TABLET | Freq: Two times a day (BID) | ORAL | Status: DC

## 2020-06-27 ENCOUNTER — Encounter (HOSPITAL_BASED_OUTPATIENT_CLINIC_OR_DEPARTMENT_OTHER): Payer: Self-pay | Admitting: Urology

## 2020-06-27 ENCOUNTER — Encounter: Payer: Self-pay | Admitting: Neurology

## 2020-06-27 ENCOUNTER — Other Ambulatory Visit: Payer: Self-pay

## 2020-06-27 ENCOUNTER — Ambulatory Visit (HOSPITAL_BASED_OUTPATIENT_CLINIC_OR_DEPARTMENT_OTHER)
Admission: RE | Admit: 2020-06-27 | Discharge: 2020-06-27 | Disposition: A | Discharge status: Home / Self Care | Payer: 59 | Attending: Urology | Admitting: Urology

## 2020-06-27 ENCOUNTER — Ambulatory Visit (HOSPITAL_COMMUNITY): Payer: 59

## 2020-06-27 ENCOUNTER — Encounter (HOSPITAL_BASED_OUTPATIENT_CLINIC_OR_DEPARTMENT_OTHER)
Admission: RE | Disposition: A | Discharge status: Home / Self Care | Payer: Self-pay | Source: Home / Self Care | Attending: Urology

## 2020-06-27 DIAGNOSIS — F17200 Nicotine dependence, unspecified, uncomplicated: Secondary | ICD-10-CM | POA: Insufficient documentation

## 2020-06-27 DIAGNOSIS — N201 Calculus of ureter: Secondary | ICD-10-CM | POA: Diagnosis present

## 2020-06-27 DIAGNOSIS — Z87442 Personal history of urinary calculi: Secondary | ICD-10-CM | POA: Insufficient documentation

## 2020-06-27 DIAGNOSIS — Z7901 Long term (current) use of anticoagulants: Secondary | ICD-10-CM | POA: Diagnosis not present

## 2020-06-27 DIAGNOSIS — Z79899 Other long term (current) drug therapy: Secondary | ICD-10-CM | POA: Insufficient documentation

## 2020-06-27 DIAGNOSIS — R42 Dizziness and giddiness: Secondary | ICD-10-CM

## 2020-06-27 HISTORY — PX: EXTRACORPOREAL SHOCK WAVE LITHOTRIPSY: SHX1557

## 2020-06-27 SURGERY — LITHOTRIPSY, ESWL
Anesthesia: LOCAL | Laterality: Left

## 2020-06-27 MED ORDER — DIPHENHYDRAMINE HCL 25 MG PO CAPS
25.0000 mg | ORAL_CAPSULE | ORAL | Status: AC
  Administered 2020-06-27: 25 mg via ORAL

## 2020-06-27 MED ORDER — SODIUM CHLORIDE 0.9% FLUSH: 3.0000 mL | Freq: Two times a day (BID) | INTRAVENOUS | Status: DC

## 2020-06-27 MED ORDER — SODIUM CHLORIDE 0.9 % IV SOLN: INTRAVENOUS | Status: DC

## 2020-06-27 MED ORDER — DIAZEPAM 5 MG PO TABS
ORAL_TABLET | ORAL | Status: AC
  Filled 2020-06-27: qty 2

## 2020-06-27 MED ORDER — DIPHENHYDRAMINE HCL 25 MG PO CAPS
ORAL_CAPSULE | ORAL | Status: AC
  Filled 2020-06-27: qty 1

## 2020-06-27 MED ORDER — OXYCODONE-ACETAMINOPHEN 10-325 MG PO TABS: 1.0000 | ORAL_TABLET | Freq: Four times a day (QID) | ORAL | 0 refills | Status: DC | PRN

## 2020-06-27 MED ORDER — DIAZEPAM 5 MG PO TABS
10.0000 mg | ORAL_TABLET | ORAL | Status: AC
  Administered 2020-06-27: 10 mg via ORAL

## 2020-06-27 MED ORDER — SULFAMETHOXAZOLE-TRIMETHOPRIM 800-160 MG PO TABS
1.0000 | ORAL_TABLET | Freq: Two times a day (BID) | ORAL | Status: DC
  Administered 2020-06-27: 1 via ORAL
  Filled 2020-06-27: qty 1

## 2020-06-27 NOTE — Interval H&P Note (Signed)
History and Physical Interval Note:  No change in stone.   Needs pain med refill.   06/27/2020 10:12 AM  Eric Savage  has presented today for surgery, with the diagnosis of LEFT URETERAL STONE.  The various methods of treatment have been discussed with the patient and family. After consideration of risks, benefits and other options for treatment, the patient has consented to  Procedure(s): LEFT EXTRACORPOREAL SHOCK WAVE LITHOTRIPSY (ESWL) (Left) as a surgical intervention.  The patient's history has been reviewed, patient examined, no change in status, stable for surgery.  I have reviewed the patient's chart and labs.  Questions were answered to the patient's satisfaction.     Bjorn Pippin

## 2020-06-27 NOTE — H&P (Signed)
I have kidney stones.  HPI: Eric Savage is a 54 year-old male established patient who is here for renal calculi.  05/2017: The patient was referred for intermittent left flank pain. His last CT scan with contrast was back in January that revealed a possible 2 mm left renal calculus. The patient continues to have occasional left paraspinal flank pain. He denies any hematuria or dysuria today. He does have some urinary frequency and nocturia occasional urinary incontinence, intermittency, and hesitancy. He was recently started on Flomax but has only been taking it since Sunday.   The patient has a history of nephrolithiasis and states that he had a ureteroscopy approximately 15 years ago for this. He does not really remember this experience.   06/09/2019: Small nonobstructing left renal calculi identified on CT stone protocol imaging study at last office visit. Determined not to be the source of patient's pain/discomfort at that time. There were no other GU abnormalities.   Seen in f/u from PCP. Pt had gross hematuria with S/p pain and discomfort last week. CT imaging with contrast performed on 8/28 showed a nonobstructing 3-4 mm mid lower pole calculi as well as a benign-appearing left renal cyst. No other GU abnormality noted. PCP started the patient on Augmentin for suspected infection. His symptoms have since resolved. Not associated with flank and lower back pain. Denies fevers or chills, nausea/vomiting. He is a current every day smoker.   08/25/2019  Patient is having persistent left lower back pain as well as left sided abdominal discomfort. Urinalysis reveals greater than 60 RBCs. CT scan back in August showed a nonobstructing left renal calculus. He denies any change in voiding habits. He has not had a cystoscopy. Denies fever, nausea, vomiting, dysuria.   09/09/2019: Cystoscopy at last office visit was noted to be benign. He was scheduled for repeat renal ultrasound was performed on 11/30  as well as follow-up KUB today. Review of ultrasound indicated stable benign-appearing left renal cysts but no sonographic evidence of a renal calculus. There was no evidence of obstructive signs.   Patient continues to have some intermittent left lower back and abdominal pain/discomfort but not nearly this. His previous presentation. He notes no additional visible blood in urine in approximately 2 weeks. He denies any interval stone material passage or burning/painful urination. He states he grossly is voiding at his baseline without bothersome increased frequency/ urgency, changes in force of stream. Denies any interval fevers/chills, nausea/vomiting.   09/23/2019: KUB was inconclusive at last visit. He still had MH on UA but was having improved symptoms. He was scheduled for f/u exam next month but returns today for increasing symptoms.   He can't recall if he has passed blood in the urine since last visit but also can't completely rule that out. He also reports increased pain in the LLQ radiating into the flank and left lower back. He will take a pain medication which helps with the flank and lower back pain but his pain in the lower abdomen remains constant. He tells me his PCP placed him on Augmentin yesterday for possible flare of diverticulitis, something he has had in the past. Urinalysis with greater than 60 red blood cells per high-power field today. He tells me symptoms presently have not previously been associated with exacerbations of diverticulitis. He has not seen any visible stone material passed. Pt also reports increased frequency/urgency, some increased hesitancy with starting his stream which is a new finding with acute onset of symptoms.   06/20/2020: Patient underwent  CT imaging of the abdomen and pelvis with contrast back in February of this year, he also had a CT stone protocol study for left lower quadrant abdominal pain in January. Is treated for exacerbation of acute  diverticulitis at that time. CT imaging noted a stable 5 mm left renal stone as well as simple appearing left renal cyst but no other obvious concerning GU abnormality. CT imaging ordered by myself back in December of last year noted a similar finding in regards to the renal cysts as well as left nonobstructing renal calculus.   06/20/2020: Patient awoke this morning with left lower back pain radiating into the flank and left side of the abdomen. Also associated with gross hematuria, mild increase in frequency from baseline. He was seen by primary care who recommended he follow up with Urology for possible obstructing ureteral calculi. He was prescribed Cipro and pain medication at that appointment.   The problem is on the left side. This is not his first kidney stone. He has had 1 stones prior to getting this one. He is currently having flank pain, back pain, and groin pain. He denies having nausea, vomiting, fever, and chills. He has not caught a stone in his urine strainer since his symptoms began.   He has had ureteroscopy for treatment of his stones in the past.     ALLERGIES: No Allergies    MEDICATIONS: Augmentin PO Daily  Lisinopril  Metoprolol Tartrate  Nexium  Aspir 81  Atorvastatin Calcium 40 mg tablet  Hydrocodone-Acetaminophen  Oxycodone Hcl 10 mg tablet  Xarelto     GU PSH: No GU PSH    NON-GU PSH: Cardiac Stent Placement Cholecystectomy (laparoscopic) Partial colectomy     GU PMH: LLQ pain - 08/25/2019 Renal calculus - 08/25/2019, - 06/09/2019, - 2018 Gross hematuria - 06/09/2019 Renal cyst - 06/09/2019 Urinary Frequency - 2018    NON-GU PMH: Anxiety Arthritis Cardiac murmur, unspecified Depression Diverticulitis GERD Heart disease, unspecified Hypercholesterolemia Hypertension Myocardial Infarction    FAMILY HISTORY: 2 sons - Other   SOCIAL HISTORY: Marital Status: Married Preferred Language: English; Race: White Current Smoking Status: Patient smokes.    Tobacco Use Assessment Completed: Used Tobacco in last 30 days? Drinks 3 caffeinated drinks per day.    REVIEW OF SYSTEMS:    GU Review Male:   Patient denies frequent urination, hard to postpone urination, burning/ pain with urination, get up at night to urinate, leakage of urine, stream starts and stops, trouble starting your stream, have to strain to urinate , erection problems, and penile pain.  Gastrointestinal (Upper):   Patient denies indigestion/ heartburn, vomiting, and nausea.  Gastrointestinal (Lower):   Patient denies diarrhea and constipation.  Constitutional:   Patient denies fever, night sweats, weight loss, and fatigue.  Skin:   Patient denies skin rash/ lesion and itching.  Eyes:   Patient denies blurred vision and double vision.  Ears/ Nose/ Throat:   Patient denies sore throat and sinus problems.  Hematologic/Lymphatic:   Patient denies swollen glands and easy bruising.  Cardiovascular:   Patient denies leg swelling and chest pains.  Respiratory:   Patient denies cough and shortness of breath.  Endocrine:   Patient denies excessive thirst.  Musculoskeletal:   Patient denies back pain and joint pain.  Neurological:   Patient denies headaches and dizziness.  Psychologic:   Patient denies depression and anxiety.   VITAL SIGNS:      06/20/2020 02:07 PM  Weight 175 lb / 79.38 kg  Height  55 in / 139.7 cm  BP 125/74 mmHg  Pulse 61 /min  Temperature 98.4 F / 36.8 C  BMI 40.7 kg/m   MULTI-SYSTEM PHYSICAL EXAMINATION:    Constitutional: Well-nourished. No physical deformities. Normally developed. Good grooming.  Neck: Neck symmetrical, not swollen. Normal tracheal position.  Respiratory: No labored breathing, no use of accessory muscles.   Cardiovascular: Normal temperature, adequate perfusion of extremities  Skin: No paleness, no jaundice  Neurologic / Psychiatric: Oriented to time, oriented to place, oriented to person. No depression, no anxiety, no agitation.   Gastrointestinal: Abdominal tenderness. No mass, no rigidity, non obese abdomen. Mild tenderness on palpation on the left side. No palpable flank or CVA tenderness.   Musculoskeletal: Normal gait and station of head and neck.     Complexity of Data:  Source Of History:  Patient, Medical Record Summary  Records Review:   Previous Doctor Records, Previous Hospital Records, Previous Patient Records  Urine Test Review:   Urinalysis, Urine Culture  X-Ray Review: KUB: Reviewed Films. Discussed With Patient.  C.T. Stone Protocol: Reviewed Films. Reviewed Report. January 2021; December 2020 C.T. Abdomen/Pelvis: Reviewed Films. Reviewed Report. February 2021    06/20/20  Urinalysis  Urine Appearance Cloudy   Urine Color Manson Passey   Urine Glucose Neg mg/dL  Urine Bilirubin Neg mg/dL  Urine Ketones Neg mg/dL  Urine Specific Gravity >1.030   Urine Blood 3+ ery/uL  Urine pH 5.5   Urine Protein 1+ mg/dL  Urine Urobilinogen 0.2 mg/dL  Urine Nitrites Neg   Urine Leukocyte Esterase Trace leu/uL  Urine WBC/hpf 0 - 5/hpf   Urine RBC/hpf >60/hpf   Urine Epithelial Cells 0 - 5/hpf   Urine Bacteria Many (>50/hpf)   Urine Mucous Present   Urine Yeast NS (Not Seen)   Urine Trichomonas Not Present   Urine Cystals Amorph Urates   Urine Casts NS (Not Seen)   Urine Sperm Not Present   Notes:                     CLINICAL DATA: Left lower quadrant pain     EXAM:  CT ABDOMEN AND PELVIS WITH CONTRAST     TECHNIQUE:  Multidetector CT imaging of the abdomen and pelvis was performed  using the standard protocol following bolus administration of  intravenous contrast.     CONTRAST: ISOVUE-300 IOPAMIDOL (ISOVUE-300) INJECTION 61%     COMPARISON: 11/03/2019     FINDINGS:  Lower chest: No acute abnormality.     Hepatobiliary: Post cholecystectomy. Stable hemangiomas.     Pancreas: Unremarkable     Spleen: Unremarkable.     Adrenals/Urinary Tract: Unchanged left renal cysts and   nonobstructing left renal calculus. No hydronephrosis. Adrenals are  unremarkable.     Stomach/Bowel: Stomach is within normal limits. Small bowel is  normal in caliber. There are likely adhesions along the ventral  abdominal wall. Appendix is normal. Evidence of partial sigmoid  resection. Colonic diverticulosis is present and there is segmental  wall thickening and pericolonic infiltration along the sigmoid.     Vascular/Lymphatic: Aortic atherosclerosis. No enlarged abdominal or  pelvic lymph nodes.     Reproductive: Prostate is unremarkable.     Other: No ascites.     Musculoskeletal: No acute osseous abnormality.     IMPRESSION:  Sigmoid diverticulitis. Colonoscopy recommended post treatment if  not recently performed.     These results will be called to the ordering clinician or  representative by the Radiologist Assistant, and  communication  documented in the PACS or zVision Dashboard.        Electronically Signed  By: Guadlupe Spanish M.D.  On: 11/24/2019 11:15   PROCEDURES:         KUB - [id number]  A single view of the abdomen is obtained. An approximately 3.19mm x 6.48mm opacity consistent with a left proximal ureteral calculi is identified between L3 and L4 transverse processes on today's study. No other additional renal or ureteral calculi seen on today's exam. He has stable pelvic phlebolith grossly unchanged from prior imaging study. Bladder grossly appears free of obstruction.      Patient confirmed No Neulasta OnPro Device.           Urinalysis w/Scope Dipstick Dipstick Cont'd Micro  Color: Brown Bilirubin: Neg mg/dL WBC/hpf: 0 - 5/hpf  Appearance: Cloudy Ketones: Neg mg/dL RBC/hpf: >16/XWR  Specific Gravity: >1.030 Blood: 3+ ery/uL Bacteria: Many (>50/hpf)  pH: 5.5 Protein: 1+ mg/dL Cystals: Amorph Urates  Glucose: Neg mg/dL Urobilinogen: 0.2 mg/dL Casts: NS (Not Seen)    Nitrites: Neg Trichomonas: Not Present    Leukocyte Esterase: Trace leu/uL Mucous:  Present      Epithelial Cells: 0 - 5/hpf      Yeast: NS (Not Seen)      Sperm: Not Present    ASSESSMENT:      ICD-10 Details  1 GU:   Ureteral calculus - N20.1 Left, Acute, Systemic Symptoms   PLAN:            Medications New Meds: Tamsulosin Hcl 0.4 mg capsule 1 capsule PO Daily   #30  2 Refill(s)            Orders Labs Urine Culture  X-Rays: KUB          Schedule Return Visit/Planned Activity: Next Available Appointment - Schedule Surgery          Document Letter(s):  Created for Patient: Clinical Summary         Notes:   Previously noted left renal calculus has likely transition into the proximal ureter and is seen today on KUB imaging study. Today's exam finding correlated with patient's symptoms indicative of obstructive uropathy from a new proximal left ureteral calculus. He has had ureteroscopy in the past. Given stones visibility, he is a good candidate for shockwave lithotripsy. He would prefer this in favor of ureteroscopy which he did not tolerate well by his report. I will share today's findings with patient's urologist and appropriately indicated, he will be set up for shockwave lithotripsy here in the near future. He has pain medication prescribed by primary care which can continue as needed. I am fine with him continuing Cipro to treat any underlying infectious processes well. I will send a precautionary urine culture today. Tamsulosin prescribed aid in possible ureteral expulsion of today's ureteral calculi, also prescribed to help augment any severe exacerbations of renal colic. He has oxycodone prescribed by PCP today. He can take tylenol to help augment this as well.   For shockwave lithotripsy I described the risks which include arrhythmia, kidney contusion, kidney hemorrhage, need for transfusion, long-term risk of diabetes or hypertension, back discomfort, flank ecchymosis, flank abrasion, inability to break up stone, inability to pass stone fragments,  Steinstrasse, infection associated with obstructing stones, need for different surgical procedure and possible need for repeat shockwave lithotripsy.   Once I have discussed with patient's urologist, the patient will be contacted with plan of care moving forward potentially scheduling for shockwave lithotripsy. He  is on Xarelto and will need clearance to d/c the anticoagulant.        Next Appointment:      Next Appointment: 06/20/2020 02:00 PM    Appointment Type: Office Visit Established Patient    Location: [facility] [phone] [id number]    Provider: Anne Fu, NP    Reason for Visit: poss ks per [facility]

## 2020-06-27 NOTE — Discharge Instructions (Signed)
Lithotripsy, Care After This sheet gives you information about how to care for yourself after your procedure. Your health care provider may also give you more specific instructions. If you have problems or questions, contact your health care provider. What can I expect after the procedure? After the procedure, it is common to have:  Some blood in your urine. This should only last for a few days.  Soreness in your back, sides, or upper abdomen for a few days.  Blotches or bruises on your back where the pressure wave entered the skin.  Pain, discomfort, or nausea when pieces (fragments) of the kidney [place] move through the tube that carries urine from the kidney to the bladder (ureter). W[place] fragments may pass soon after the procedure, but they may continue to pass for up to 4-8 weeks. ? If you have severe pain or nausea, contact your health care provider. This may be caused by a large [place] that was not broken up, and this may mean that you need more treatment.  Some pain or discomfort during urination.  Some pain or discomfort in the lower abdomen or (in men) at the base of the penis. Follow these instructions at home: Medicines  Take over-the-counter and prescription medicines only as told by your health care provider.  If you were prescribed an antibiotic medicine, take it as told by your health care provider. Do not stop taking the antibiotic even if you start to feel better.  Do not drive for 24 hours if you were given a medicine to help you relax (sedative).  Do not drive or use heavy machinery while taking prescription pain medicine. Eating and drinking      Drink enough water and fluids to keep your urine clear or pale yellow. This helps any remaining pieces of the [place] to pass. It can also help prevent new stones from forming.  Eat plenty of fresh fruits and vegetables.  Follow instructions from your health care provider about eating and drinking restrictions. You may be  instructed: ? To reduce how much salt (sodium) you eat or drink. Check ingredients and nutrition facts on packaged foods and beverages. ? To reduce how much meat you eat.  Eat the recommended amount of calcium for your age and gender. Ask your health care provider how much calcium you should have. General instructions  Get plenty of rest.  Most people can resume normal activities 1-2 days after the procedure. Ask your health care provider what activities are safe for you.  Your health care provider may direct you to lie in a certain position (postural drainage) and tap firmly (percuss) over your kidney area to help [place] fragments pass. Follow instructions as told by your health care provider.  If directed, strain all urine through the strainer that was provided by your health care provider. ? Keep all fragments for your health care provider to see. Any stones that are found may be sent to a medical lab for examination. The [place] may be as small as a grain of salt.  Keep all follow-up visits as told by your health care provider. This is important. Contact a health care provider if:  You have pain that is severe or does not get better with medicine.  You have nausea that is severe or does not go away.  You have blood in your urine longer than your health care provider told you to expect.  You have more blood in your urine.  You have pain during urination that does   not go away.  You urinate more frequently than usual and this does not go away.  You develop a rash or any other possible signs of an allergic reaction. Get help right away if:  You have severe pain in your back, sides, or upper abdomen.  You have severe pain while urinating.  Your urine is very dark red.  You have blood in your stool (feces).  You cannot pass any urine at all.  You feel a strong urge to urinate after emptying your bladder.  You have a fever or chills.  You develop shortness of breath,  difficulty breathing, or chest pain.  You have severe nausea that leads to persistent vomiting.  You faint. Summary  After this procedure, it is common to have some pain, discomfort, or nausea when pieces (fragments) of the kidney stone move through the tube that carries urine from the kidney to the bladder (ureter). If this pain or nausea is severe, however, you should contact your health care provider.  Most people can resume normal activities 1-2 days after the procedure. Ask your health care provider what activities are safe for you.  Drink enough water and fluids to keep your urine clear or pale yellow. This helps any remaining pieces of the stone to pass, and it can help prevent new stones from forming.  If directed, strain your urine and keep all fragments for your health care provider to see. Fragments or stones may be as small as a grain of salt.  Get help right away if you have severe pain in your back, sides, or upper abdomen or have severe pain while urinating.  You may resume the Xarelto in 48 hour if you are not bleeding.     This information is not intended to replace advice given to you by your health care provider. Make sure you discuss any questions you have with your health care provider. Document Revised: 01/05/2019 Document Reviewed: 08/15/2016 Elsevier Patient Education  2020 [employer].

## 2020-06-28 ENCOUNTER — Ambulatory Visit (HOSPITAL_COMMUNITY)
Admission: RE | Admit: 2020-06-28 | Discharge: 2020-06-28 | Disposition: A | Discharge status: Home / Self Care | Payer: 59 | Source: Ambulatory Visit | Attending: Nurse Practitioner | Admitting: Nurse Practitioner

## 2020-06-28 ENCOUNTER — Encounter (HOSPITAL_BASED_OUTPATIENT_CLINIC_OR_DEPARTMENT_OTHER): Payer: Self-pay | Admitting: Urology

## 2020-06-28 VITALS — BP 120/64 | HR 55 | Ht 65.0 in | Wt 178.2 lb

## 2020-06-28 DIAGNOSIS — Z955 Presence of coronary angioplasty implant and graft: Secondary | ICD-10-CM | POA: Diagnosis not present

## 2020-06-28 DIAGNOSIS — Z7982 Long term (current) use of aspirin: Secondary | ICD-10-CM | POA: Insufficient documentation

## 2020-06-28 DIAGNOSIS — N390 Urinary tract infection, site not specified: Secondary | ICD-10-CM | POA: Insufficient documentation

## 2020-06-28 DIAGNOSIS — F419 Anxiety disorder, unspecified: Secondary | ICD-10-CM | POA: Insufficient documentation

## 2020-06-28 DIAGNOSIS — F1290 Cannabis use, unspecified, uncomplicated: Secondary | ICD-10-CM | POA: Diagnosis not present

## 2020-06-28 DIAGNOSIS — Z9049 Acquired absence of other specified parts of digestive tract: Secondary | ICD-10-CM | POA: Insufficient documentation

## 2020-06-28 DIAGNOSIS — I2510 Atherosclerotic heart disease of native coronary artery without angina pectoris: Secondary | ICD-10-CM | POA: Insufficient documentation

## 2020-06-28 DIAGNOSIS — Z7901 Long term (current) use of anticoagulants: Secondary | ICD-10-CM | POA: Insufficient documentation

## 2020-06-28 DIAGNOSIS — I10 Essential (primary) hypertension: Secondary | ICD-10-CM | POA: Diagnosis not present

## 2020-06-28 DIAGNOSIS — D6869 Other thrombophilia: Secondary | ICD-10-CM | POA: Diagnosis not present

## 2020-06-28 DIAGNOSIS — Z7289 Other problems related to lifestyle: Secondary | ICD-10-CM | POA: Insufficient documentation

## 2020-06-28 DIAGNOSIS — I480 Paroxysmal atrial fibrillation: Secondary | ICD-10-CM | POA: Diagnosis present

## 2020-06-28 DIAGNOSIS — Z79899 Other long term (current) drug therapy: Secondary | ICD-10-CM | POA: Diagnosis not present

## 2020-06-28 DIAGNOSIS — R0683 Snoring: Secondary | ICD-10-CM | POA: Diagnosis not present

## 2020-06-28 DIAGNOSIS — F17210 Nicotine dependence, cigarettes, uncomplicated: Secondary | ICD-10-CM | POA: Insufficient documentation

## 2020-06-28 DIAGNOSIS — Z563 Stressful work schedule: Secondary | ICD-10-CM | POA: Insufficient documentation

## 2020-06-28 MED ORDER — METOPROLOL TARTRATE 25 MG PO TABS: ORAL_TABLET | ORAL | 3 refills | Status: DC

## 2020-06-28 MED ORDER — METOPROLOL SUCCINATE ER 25 MG PO TB24: 25.0000 mg | ORAL_TABLET | Freq: Every day | ORAL | 3 refills | Status: DC

## 2020-06-28 NOTE — Patient Instructions (Signed)
Stop metoprolol tartrate (lopressor) daily -- only use as needed for breakthrough afib of heart rates staying over 100   Start metoprolol succinate (toprol) 25mg  once a day at bedtime

## 2020-06-28 NOTE — Telephone Encounter (Signed)
Return call to [facility] with [facility] left Dr.[name]'s advice on personal voice mail.

## 2020-06-29 ENCOUNTER — Encounter (HOSPITAL_COMMUNITY): Payer: Self-pay | Admitting: Nurse Practitioner
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
## 2020-06-30 ENCOUNTER — Other Ambulatory Visit (HOSPITAL_COMMUNITY): Payer: 59

## 2020-07-11 ENCOUNTER — Ambulatory Visit (HOSPITAL_COMMUNITY): Admission: RE | Admit: 2020-07-11 | Payer: 59 | Source: Ambulatory Visit

## 2020-07-18 ENCOUNTER — Telehealth: Payer: Self-pay | Admitting: Cardiology

## 2020-07-18 ENCOUNTER — Telehealth: Payer: Self-pay

## 2020-07-18 NOTE — Telephone Encounter (Signed)
Follow Up:     Pt is calling to check on the status of his clearance please.

## 2020-07-18 NOTE — Telephone Encounter (Signed)
   [facility] Pre-operative Risk Assessment    Request for surgical clearance:  1. What type of surgery is being performed? LEFT ELBOW DEBRIDEMENT LOOSE BODY   2. When is this surgery scheduled? TBD   3. What type of clearance is required (medical clearance vs. Pharmacy clearance to hold med vs. Both)? BOTH  4. Are there any medications that need to be held prior to surgery and how long? [place]   5. Practice name and name of physician performing surgery? [facility] DAX VARKEY,MD ATTN:SHERRI   6. What is the office phone number? [phone]   7.   What is the office fax number? [phone]  8.   Anesthesia type (None, local, MAC, general) ? CHOICE

## 2020-07-18 NOTE — Telephone Encounter (Signed)
Pt.notified

## 2020-07-18 NOTE — Telephone Encounter (Signed)
   Primary Cardiologist: [name], MD  Chart reviewed as part of pre-operative protocol coverage. Given past medical history and time since last visit, based on ACC/AHA guidelines, Eric Savage would be at acceptable risk for the planned procedure without further cardiovascular testing.   Patient with diagnosis of a fib on Xarelto for anticoagulation.    Procedure: LEFT ELBOW DEBRIDEMENT LOOSE BODY  Date of procedure: TBD  CHADS2-VASc score of  2 ( HTN, CAD)  CrCl 68.9 ml/min using adjusted body weight Platelet count 228K  Per office protocol, patient can hold Xarelto for 2-3 days prior to procedure.    Patient will not need bridging with Lovenox (enoxaparin) around procedure.  If not bridging, patient should restart Xarelto on the evening of procedure or day after, at discretion of procedure MD  I will route this recommendation to the requesting party via Epic fax function and remove from pre-op pool.  Please call with questions.  Eric Ripple. Vivion Romano NP-C    07/18/2020, 12:47 PM [facility] [address] Office [phone] Fax [phone]

## 2020-07-18 NOTE — Telephone Encounter (Signed)
Patient with diagnosis of a fib on Xarelto for anticoagulation.    Procedure:  LEFT ELBOW DEBRIDEMENT LOOSE BODY   Date of procedure: TBD  CHADS2-VASc score of  2 ( HTN, CAD)  CrCl 68.9 ml/min using adjusted body weight Platelet count 228K  Per office protocol, patient can hold Xarelto for 2-3 days prior to procedure.    Patient will not need bridging with Lovenox (enoxaparin) around procedure.  If not bridging, patient should restart Xarelto on the evening of procedure or day after, at discretion of procedure MD

## 2020-08-06 ENCOUNTER — Other Ambulatory Visit (HOSPITAL_COMMUNITY): Payer: Self-pay | Admitting: Nurse Practitioner

## 2020-08-06 ENCOUNTER — Other Ambulatory Visit: Payer: Self-pay | Admitting: Cardiology

## 2020-08-15 ENCOUNTER — Telehealth: Payer: Self-pay | Admitting: *Deleted

## 2020-08-15 NOTE — Telephone Encounter (Signed)
Patient with diagnosis of afib on Xarelto for anticoagulation.    Procedure: Lt Total Hip replacement Date of procedure: TBD    CHA2DS2-VASc Score = 2  This indicates a 2.2% annual risk of stroke. The patient's score is based upon: CHF History: 0 HTN History: 1 Diabetes History: 0 Stroke History: 0 Vascular Disease History: 1 Age Score: 0 Gender Score: 0     CrCl 68.9  Per office protocol, patient can hold Xarelto for 3 days prior to procedure.

## 2020-08-15 NOTE — Telephone Encounter (Signed)
° °   [facility] Pre-operative Risk Assessment    HEARTCARE STAFF: - Please ensure there is not already an duplicate clearance open for this procedure. - Under Visit Info/Reason for Call, type in Other and utilize the format Clearance MM/DD/YY or Clearance TBD. Do not use dashes or single digits. - If request is for dental extraction, please clarify the # of teeth to be extracted.  Request for surgical clearance:  1. What type of surgery is being performed? Lt Total Hip replacement   2. When is this surgery scheduled? TBD   3. What type of clearance is required (medical clearance vs. Pharmacy clearance to hold med vs. Both)? Both  4. Are there any medications that need to be held prior to surgery and how long?Xarelto    5. Practice name and name of physician performing surgery? [facility] Dr Cleda Mccreedy   6. What is the office phone number? [phone]   7.   What is the office fax number? [phone]  8.   Anesthesia type (None, local, MAC, general) ? Spinal Anesthesia   Barbaraann Barthel 08/15/2020, 12:59 PM  _________________________________________________________________   (provider comments below)

## 2020-08-17 ENCOUNTER — Other Ambulatory Visit: Payer: Self-pay

## 2020-08-17 ENCOUNTER — Ambulatory Visit (HOSPITAL_COMMUNITY)
Admission: RE | Admit: 2020-08-17 | Discharge: 2020-08-17 | Disposition: A | Discharge status: Home / Self Care | Payer: 59 | Source: Ambulatory Visit | Attending: Nurse Practitioner | Admitting: Nurse Practitioner

## 2020-08-17 DIAGNOSIS — I10 Essential (primary) hypertension: Secondary | ICD-10-CM | POA: Diagnosis not present

## 2020-08-17 DIAGNOSIS — I2510 Atherosclerotic heart disease of native coronary artery without angina pectoris: Secondary | ICD-10-CM | POA: Insufficient documentation

## 2020-08-17 DIAGNOSIS — F17200 Nicotine dependence, unspecified, uncomplicated: Secondary | ICD-10-CM | POA: Diagnosis not present

## 2020-08-17 DIAGNOSIS — E785 Hyperlipidemia, unspecified: Secondary | ICD-10-CM | POA: Insufficient documentation

## 2020-08-17 DIAGNOSIS — I340 Nonrheumatic mitral (valve) insufficiency: Secondary | ICD-10-CM | POA: Diagnosis not present

## 2020-08-17 DIAGNOSIS — I480 Paroxysmal atrial fibrillation: Secondary | ICD-10-CM

## 2020-08-17 LAB — ECHOCARDIOGRAM COMPLETE
Area-P 1/2: 2.66 cm2
Calc EF: 50.5 %
MV M vel: 4.6 m/s
MV Peak grad: 84.6 mmHg
S' Lateral: 3.6 cm
Single Plane A2C EF: 46.7 %
Single Plane A4C EF: 53.3 %

## 2020-08-17 NOTE — Progress Notes (Signed)
  Echocardiogram 2D Echocardiogram has been performed.  Stark Bray Swaim 08/17/2020, 11:38 AM

## 2020-08-19 NOTE — Telephone Encounter (Signed)
° °  Primary Cardiologist: [name], MD  Chart reviewed as part of pre-operative protocol coverage. Given past medical history and time since last visit, based on ACC/AHA guidelines, Eric Savage would be at acceptable risk for the planned procedure without further cardiovascular testing.   Patient with diagnosis of afib on Xarelto for anticoagulation.    Procedure: Lt Total Hip replacement Date of procedure: TBD    CHA2DS2-VASc Score = 2  This indicates a 2.2% annual risk of stroke. The patient's score is based upon: CHF History: 0 HTN History: 1 Diabetes History: 0 Stroke History: 0 Vascular Disease History: 1 Age Score: 0 Gender Score: 0     CrCl 68.9  Per office protocol, patient can hold Xarelto for 3 days prior to procedure.     I will route this recommendation to the requesting party via Epic fax function and remove from pre-op pool.  Please call with questions.  [name]. [name] NP-C    08/19/2020, 8:58 AM [facility] [address] Office [phone] Fax [phone]

## 2020-08-30 ENCOUNTER — Encounter (HOSPITAL_COMMUNITY): Payer: Self-pay

## 2020-09-12 ENCOUNTER — Ambulatory Visit: Payer: Self-pay | Admitting: Physician Assistant

## 2020-09-12 ENCOUNTER — Encounter (HOSPITAL_COMMUNITY): Payer: Self-pay | Admitting: *Deleted

## 2020-09-12 NOTE — H&P (View-Only) (Signed)
TOTAL HIP ADMISSION H&P  Patient is admitted for left total hip arthroplasty.  Subjective:  Chief Complaint: left hip pain  HPI: Eric Savage, 54 y.o. male, has a history of pain and functional disability in the left hip(s) due to arthritis and patient has failed non-surgical conservative treatments for greater than 12 weeks to include activity modification.  Onset of symptoms was gradual starting 8 years ago with gradually worsening course since that time.The patient noted no past surgery on the left hip(s).  Patient currently rates pain in the left hip at 8 out of 10 with activity. Patient has night pain, worsening of pain with activity and weight bearing, pain that interfers with activities of daily living and pain with passive range of motion. Patient has evidence of periarticular osteophytes and joint space narrowing by imaging studies. This condition presents safety issues increasing the risk of falls.   There is no current active infection.  Patient Active Problem List   Diagnosis Date Noted  . PAF (paroxysmal atrial fibrillation) (HCC); CHA2DS2-VASc Score 2 08/24/2019  . S/P colectomy 06/05/2017  . Preoperative cardiovascular examination 12/06/2016  . Diverticulitis large intestine w/o perforation or abscess w/o bleeding   . Diverticulitis 09/08/2016  . Obesity (BMI 30-39.9) 04/26/2014  . Tobacco abuse counseling 04/12/2013  . Non-compliance with treatment 01/28/2013  . Essential hypertension 05/16/2012  . Dyslipidemia, goal LDL below 70 05/16/2012  . Smoker 05/16/2012  . CAD, RCA/CFX DES 12/10 with residual 50-60% LAD.- Cath 05/19/12- and 01/28/13 no ISR- med Rx 09/15/2009   Past Medical History:  Diagnosis Date  . Acid reflux   . Anxiety   . Arthritis   . CAD S/P percutaneous coronary angioplasty 12/10   RCA/CFX DES  . Chest pain 01/2013   Evaluate cardiac catheterization, no obstructive CAD  . Diverticulitis   . Dysrhythmia    OCC PALPITATION   . High cholesterol   .  History of kidney stones   . Hypertension     Past Surgical History:  Procedure Laterality Date  . CHOLECYSTECTOMY N/A 08/04/2018   Procedure: LAPAROSCOPIC CHOLECYSTECTOMY;  Surgeon: Violeta Gelinas, MD;  Location: [facility] OR;  Service: General;  Laterality: N/A;  . choleycystectomy  2020  . COLONOSCOPY    . CORONARY ANGIOPLASTY WITH STENT PLACEMENT  09/2009   Proximal RCA (95%-60%) - Cypher DES 3.0 mm but 33 mm --> 3.75 mm; pCFX 80%: PCI - XIENCE V. DES 2.75 mm x 15 mm --> 3.0 mm  . EXTRACORPOREAL SHOCK WAVE LITHOTRIPSY Left 06/27/2020   Procedure: LEFT EXTRACORPOREAL SHOCK WAVE LITHOTRIPSY (ESWL);  Surgeon: Bjorn Pippin, MD;  Location: [facility];  Service: Urology;  Laterality: Left;  . LAPAROSCOPIC SIGMOID COLECTOMY N/A 06/05/2017   Procedure: SIGMOID COLECTOMY;  Surgeon: Violeta Gelinas, MD;  Location: [facility] OR;  Service: General;  Laterality: N/A;  . LEFT HEART CATHETERIZATION WITH CORONARY ANGIOGRAM N/A 05/19/2012   Procedure: LEFT HEART CATHETERIZATION WITH CORONARY ANGIOGRAM;  Surgeon: Marykay Lex, MD;  Location: [facility] CATH LAB;::  Cx & RCA stents patent. Moderate D1 & D2 as well as ostial AVG Cx.    Marland Kitchen LEFT HEART CATHETERIZATION WITH CORONARY ANGIOGRAM N/A 01/28/2013   Procedure: LEFT HEART CATHETERIZATION WITH CORONARY ANGIOGRAM;  Surgeon: Lennette Bihari, MD;  Location: [facility] CATH LAB;  Service: Cardiovascular:  No evidence for restenosis of either RCA or LCX. Mild LAD 20% narrowing and 60 - 70% smooth mid diagonal 2 stenosis.  Marland Kitchen NM MYOCAR PERF WALL MOTION  01/04/2011   Protocol:Bruce,  post stress EF=69%, Exercise Cap 13 METS, attenuation defect in inferior region of myocardium, no sig. ischemia demonstrated  . TENNIS ELBOW RELEASE/NIRSCHEL PROCEDURE Right 12/16/2019   Procedure: DEBRIDEMENT OF RIGHT ELBOW, EXCISION OSTEOPHYTES;  Surgeon: Bjorn Pippin, MD;  [facility];  Service: Orthopedics;  Laterality: Right;  . TONSILLECTOMY  ~ 1973  . TRANSTHORACIC  ECHOCARDIOGRAM  05/13/2009   EF =>55%, mild mitral regurg, trace tricuspid regurg.  Marland Kitchen ULNAR NERVE TRANSPOSITION Right 12/16/2019   Procedure: ULNAR NERVE DECOMPRESSION/TRANSPOSITION;  Surgeon: Bjorn Pippin, MD;  Location: [facility];  Service: Orthopedics;  Laterality: Right;    Current Outpatient Medications  Medication Sig Dispense Refill Last Dose  . acetaminophen (TYLENOL) 500 MG tablet Take 500 mg by mouth every 8 (eight) hours as needed (for pain.).      Marland Kitchen ALPRAZolam (XANAX) 0.5 MG tablet Take 1 mg by mouth at bedtime.      Marland Kitchen aspirin EC 81 MG tablet Take 81 mg by mouth at bedtime.     Marland Kitchen atorvastatin (LIPITOR) 40 MG tablet TAKE 1 TABLET BY MOUTH EVERY DAY (Patient taking differently: Take 40 mg by mouth at bedtime. ) 30 tablet 11   . cephALEXin (KEFLEX) 500 MG capsule Take 1 capsule (500 mg total) by mouth 4 (four) times daily. (Patient not taking: Reported on 09/08/2020) 40 capsule 0   . lisinopril (ZESTRIL) 20 MG tablet TAKE 1 TABLET BY MOUTH EVERY DAY (Patient taking differently: Take 20 mg by mouth every evening. ) 30 tablet 2   . metoprolol succinate (TOPROL XL) 25 MG 24 hr tablet Take 1 tablet (25 mg total) by mouth at bedtime. 30 tablet 3   . metoprolol tartrate (LOPRESSOR) 25 MG tablet Take 1 tablet every 6 hours needed for breakthrough afib HR over 100 (Patient taking differently: Take 25 mg by mouth every 6 (six) hours as needed (breakthrough afib HR over 100). ) 30 tablet 3   . omeprazole (PRILOSEC) 40 MG capsule Take 40 mg by mouth at bedtime.      Marland Kitchen oxyCODONE-acetaminophen (PERCOCET) 10-325 MG tablet Take 1 tablet by mouth every 6 (six) hours as needed for pain. (Patient not taking: Reported on 09/08/2020) 15 tablet 0   . XARELTO 20 MG TABS tablet TAKE 1 TABLET (20 MG TOTAL) BY MOUTH DAILY WITH SUPPER. (Patient taking differently: Take 20 mg by mouth every evening. ) 30 tablet 6    No current facility-administered medications for this visit.   Facility-Administered  Medications Ordered in Other Visits  Medication Dose Route Frequency Provider Last Rate Last Admin  . sulfamethoxazole-trimethoprim (BACTRIM DS) 800-160 MG per tablet 1 tablet  1 tablet Oral Q12H Bjorn Pippin, MD      . sulfamethoxazole-trimethoprim (BACTRIM DS) 800-160 MG per tablet 1 tablet  1 tablet Oral Q12H Bjorn Pippin, MD       No Known Allergies  Social History   Tobacco Use  . Smoking status: Current Every Day Smoker    Packs/day: 0.25    Years: 30.00    Pack years: 7.50    Types: Cigarettes  . Smokeless tobacco: Never Used  Substance Use Topics  . Alcohol use: Yes    Alcohol/week: 8.0 - 10.0 standard drinks    Types: 8 - 10 Shots of liquor per week    Comment: twice a week    Family History  Problem Relation Age of Onset  . CAD Father 76       CABG  Review of Systems  Cardiovascular: Positive for chest pain and palpitations.  Gastrointestinal: Positive for diarrhea, nausea and vomiting.  Genitourinary: Positive for difficulty urinating, dysuria, frequency and hematuria.  Musculoskeletal: Positive for arthralgias.  Neurological: Positive for dizziness and headaches.  All other systems reviewed and are negative.   Objective:  Physical Exam Constitutional:      General: He is not in acute distress.    Appearance: Normal appearance.  HENT:     Head: Normocephalic and atraumatic.  Eyes:     Extraocular Movements: Extraocular movements intact.     Pupils: Pupils are equal, round, and reactive to light.  Cardiovascular:     Rate and Rhythm: Normal rate and regular rhythm.     Pulses: Normal pulses.     Heart sounds: Normal heart sounds.  Pulmonary:     Effort: Pulmonary effort is normal. No respiratory distress.     Breath sounds: Normal breath sounds.  Abdominal:     General: Abdomen is flat. Bowel sounds are normal. There is no distension.     Palpations: Abdomen is soft.     Tenderness: There is no abdominal tenderness.  Musculoskeletal:     Cervical  back: Normal range of motion and neck supple.     Left hip: Tenderness and bony tenderness present. Decreased range of motion. Decreased strength.  Lymphadenopathy:     Cervical: No cervical adenopathy.  Skin:    General: Skin is warm and dry.     Findings: No erythema or rash.  Neurological:     General: No focal deficit present.     Mental Status: He is alert and oriented to person, place, and time.  Psychiatric:        Mood and Affect: Mood normal.        Behavior: Behavior normal.     Vital signs in last 24 hours: @VSRANGES @  Labs:   Estimated body mass index is 29.65 kg/m as calculated from the following:   Height as of 06/28/20: 5\' 5"  (1.651 m).   Weight as of 06/28/20: 80.8 kg.   Imaging Review Plain radiographs demonstrate severe degenerative joint disease of the left hip(s). The bone quality appears to be good for age and reported activity level.      Assessment/Plan:  End stage arthritis, left hip(s)  The patient history, physical examination, clinical judgement of the provider and imaging studies are consistent with end stage degenerative joint disease of the left hip(s) and total hip arthroplasty is deemed medically necessary. The treatment options including medical management, injection therapy, arthroscopy and arthroplasty were discussed at length. The risks and benefits of total hip arthroplasty were presented and reviewed. The risks due to aseptic loosening, infection, stiffness, dislocation/subluxation,  thromboembolic complications and other imponderables were discussed.  The patient acknowledged the explanation, agreed to proceed with the plan and consent was signed. Patient is being admitted for inpatient treatment for surgery, pain control, PT, OT, prophylactic antibiotics, VTE prophylaxis, progressive ambulation and ADL's and discharge planning.The patient is planning to be discharged home with outpt PT   Anticipated LOS equal to or greater than 2  midnights due to - Age 50 and older with one or more of the following:  - Obesity  - Expected need for hospital services (PT, OT, Nursing) required for safe  discharge  - Anticipated need for postoperative skilled nursing care or inpatient rehab  - Active co-morbidities: Coronary Artery Disease and Cardiac Arrhythmia OR   - Unanticipated findings during/Post Surgery: None  -  Patient is a high risk of re-admission due to: None

## 2020-09-12 NOTE — Progress Notes (Addendum)
PCP - Marva Panda, NP clearance on chart 07-15-20 Cardiologist - Lollie Marrow clearance 08-19-20 Silver Huguenin 08-19-20 epic  PPM/ICD -  Device Orders -  Rep Notified -   Chest x-ray - 1 view 06-2020 epic EKG - 06-28-20 epic, and 07-22-20 on chart Stress Test - 08-2019 epic ECHO - 08-17-20 epic Cardiac Cath -   Sleep Study -  CPAP -   Fasting Blood Sugar -  Checks Blood Sugar _____ times a day  Blood Thinner Instructions:Xarelto hold 3 days prior Aspirin Instructions:  ERAS Protcol - PRE-SURGERY Ensure or G2-   COVID TEST- 12-14  ACTIVITY-Can do own yard work and walk to [employer] without SOB, States he does get SOB when walking stairs has cardiac clearance Anesthesia review: CAD w/ stent, A-FIB, HTN, PT , PTT  Patient denies shortness of breath, fever, cough and chest pain at PAT appointment  NONE   All instructions explained to the patient, with a verbal understanding of the material. Patient agrees to go over the instructions while at home for a better understanding. Patient also instructed to self quarantine after being tested for COVID-19. The opportunity to ask questions was provided.

## 2020-09-12 NOTE — H&P (Signed)
TOTAL HIP ADMISSION H&P  Patient is admitted for left total hip arthroplasty.  Subjective:  Chief Complaint: left hip pain  HPI: Eric Savage, 54 y.o. male, has a history of pain and functional disability in the left hip(s) due to arthritis and patient has failed non-surgical conservative treatments for greater than 12 weeks to include activity modification.  Onset of symptoms was gradual starting 8 years ago with gradually worsening course since that time.The patient noted no past surgery on the left hip(s).  Patient currently rates pain in the left hip at 8 out of 10 with activity. Patient has night pain, worsening of pain with activity and weight bearing, pain that interfers with activities of daily living and pain with passive range of motion. Patient has evidence of periarticular osteophytes and joint space narrowing by imaging studies. This condition presents safety issues increasing the risk of falls.   There is no current active infection.  Patient Active Problem List   Diagnosis Date Noted  . PAF (paroxysmal atrial fibrillation) (HCC); CHA2DS2-VASc Score 2 08/24/2019  . S/P colectomy 06/05/2017  . Preoperative cardiovascular examination 12/06/2016  . Diverticulitis large intestine w/o perforation or abscess w/o bleeding   . Diverticulitis 09/08/2016  . Obesity (BMI 30-39.9) 04/26/2014  . Tobacco abuse counseling 04/12/2013  . Non-compliance with treatment 01/28/2013  . Essential hypertension 05/16/2012  . Dyslipidemia, goal LDL below 70 05/16/2012  . Smoker 05/16/2012  . CAD, RCA/CFX DES 12/10 with residual 50-60% LAD.- Cath 05/19/12- and 01/28/13 no ISR- med Rx 09/15/2009   Past Medical History:  Diagnosis Date  . Acid reflux   . Anxiety   . Arthritis   . CAD S/P percutaneous coronary angioplasty 12/10   RCA/CFX DES  . Chest pain 01/2013   Evaluate cardiac catheterization, no obstructive CAD  . Diverticulitis   . Dysrhythmia    OCC PALPITATION   . High cholesterol   .  History of kidney stones   . Hypertension     Past Surgical History:  Procedure Laterality Date  . CHOLECYSTECTOMY N/A 08/04/2018   Procedure: LAPAROSCOPIC CHOLECYSTECTOMY;  Surgeon: Violeta Gelinas, MD;  Location: [facility] OR;  Service: General;  Laterality: N/A;  . choleycystectomy  2020  . COLONOSCOPY    . CORONARY ANGIOPLASTY WITH STENT PLACEMENT  09/2009   Proximal RCA (95%-60%) - Cypher DES 3.0 mm but 33 mm --> 3.75 mm; pCFX 80%: PCI - XIENCE V. DES 2.75 mm x 15 mm --> 3.0 mm  . EXTRACORPOREAL SHOCK WAVE LITHOTRIPSY Left 06/27/2020   Procedure: LEFT EXTRACORPOREAL SHOCK WAVE LITHOTRIPSY (ESWL);  Surgeon: Bjorn Pippin, MD;  Location: [facility];  Service: Urology;  Laterality: Left;  . LAPAROSCOPIC SIGMOID COLECTOMY N/A 06/05/2017   Procedure: SIGMOID COLECTOMY;  Surgeon: Violeta Gelinas, MD;  Location: [facility] OR;  Service: General;  Laterality: N/A;  . LEFT HEART CATHETERIZATION WITH CORONARY ANGIOGRAM N/A 05/19/2012   Procedure: LEFT HEART CATHETERIZATION WITH CORONARY ANGIOGRAM;  Surgeon: Marykay Lex, MD;  Location: [facility] CATH LAB;::  Cx & RCA stents patent. Moderate D1 & D2 as well as ostial AVG Cx.    Marland Kitchen LEFT HEART CATHETERIZATION WITH CORONARY ANGIOGRAM N/A 01/28/2013   Procedure: LEFT HEART CATHETERIZATION WITH CORONARY ANGIOGRAM;  Surgeon: Lennette Bihari, MD;  Location: [facility] CATH LAB;  Service: Cardiovascular:  No evidence for restenosis of either RCA or LCX. Mild LAD 20% narrowing and 60 - 70% smooth mid diagonal 2 stenosis.  Marland Kitchen NM MYOCAR PERF WALL MOTION  01/04/2011   Protocol:Bruce,  post stress EF=69%, Exercise Cap 13 METS, attenuation defect in inferior region of myocardium, no sig. ischemia demonstrated  . TENNIS ELBOW RELEASE/NIRSCHEL PROCEDURE Right 12/16/2019   Procedure: DEBRIDEMENT OF RIGHT ELBOW, EXCISION OSTEOPHYTES;  Surgeon: Bjorn Pippin, MD;  Location: [facility];  Service: Orthopedics;  Laterality: Right;  . TONSILLECTOMY  ~ 1973  . TRANSTHORACIC  ECHOCARDIOGRAM  05/13/2009   EF =>55%, mild mitral regurg, trace tricuspid regurg.  Marland Kitchen ULNAR NERVE TRANSPOSITION Right 12/16/2019   Procedure: ULNAR NERVE DECOMPRESSION/TRANSPOSITION;  Surgeon: Bjorn Pippin, MD;  Location: [facility];  Service: Orthopedics;  Laterality: Right;    Current Outpatient Medications  Medication Sig Dispense Refill Last Dose  . acetaminophen (TYLENOL) 500 MG tablet Take 500 mg by mouth every 8 (eight) hours as needed (for pain.).      Marland Kitchen ALPRAZolam (XANAX) 0.5 MG tablet Take 1 mg by mouth at bedtime.      Marland Kitchen aspirin EC 81 MG tablet Take 81 mg by mouth at bedtime.     Marland Kitchen atorvastatin (LIPITOR) 40 MG tablet TAKE 1 TABLET BY MOUTH EVERY DAY (Patient taking differently: Take 40 mg by mouth at bedtime. ) 30 tablet 11   . cephALEXin (KEFLEX) 500 MG capsule Take 1 capsule (500 mg total) by mouth 4 (four) times daily. (Patient not taking: Reported on 09/08/2020) 40 capsule 0   . lisinopril (ZESTRIL) 20 MG tablet TAKE 1 TABLET BY MOUTH EVERY DAY (Patient taking differently: Take 20 mg by mouth every evening. ) 30 tablet 2   . metoprolol succinate (TOPROL XL) 25 MG 24 hr tablet Take 1 tablet (25 mg total) by mouth at bedtime. 30 tablet 3   . metoprolol tartrate (LOPRESSOR) 25 MG tablet Take 1 tablet every 6 hours needed for breakthrough afib HR over 100 (Patient taking differently: Take 25 mg by mouth every 6 (six) hours as needed (breakthrough afib HR over 100). ) 30 tablet 3   . omeprazole (PRILOSEC) 40 MG capsule Take 40 mg by mouth at bedtime.      Marland Kitchen oxyCODONE-acetaminophen (PERCOCET) 10-325 MG tablet Take 1 tablet by mouth every 6 (six) hours as needed for pain. (Patient not taking: Reported on 09/08/2020) 15 tablet 0   . XARELTO 20 MG TABS tablet TAKE 1 TABLET (20 MG TOTAL) BY MOUTH DAILY WITH SUPPER. (Patient taking differently: Take 20 mg by mouth every evening. ) 30 tablet 6    No current facility-administered medications for this visit.   Facility-Administered  Medications Ordered in Other Visits  Medication Dose Route Frequency Provider Last Rate Last Admin  . sulfamethoxazole-trimethoprim (BACTRIM DS) 800-160 MG per tablet 1 tablet  1 tablet Oral Q12H Bjorn Pippin, MD      . sulfamethoxazole-trimethoprim (BACTRIM DS) 800-160 MG per tablet 1 tablet  1 tablet Oral Q12H Bjorn Pippin, MD       No Known Allergies  Social History   Tobacco Use  . Smoking status: Current Every Day Smoker    Packs/day: 0.25    Years: 30.00    Pack years: 7.50    Types: Cigarettes  . Smokeless tobacco: Never Used  Substance Use Topics  . Alcohol use: Yes    Alcohol/week: 8.0 - 10.0 standard drinks    Types: 8 - 10 Shots of liquor per week    Comment: twice a week    Family History  Problem Relation Age of Onset  . CAD Father 76       CABG  Review of Systems  Cardiovascular: Positive for chest pain and palpitations.  Gastrointestinal: Positive for diarrhea, nausea and vomiting.  Genitourinary: Positive for difficulty urinating, dysuria, frequency and hematuria.  Musculoskeletal: Positive for arthralgias.  Neurological: Positive for dizziness and headaches.  All other systems reviewed and are negative.   Objective:  Physical Exam Constitutional:      General: He is not in acute distress.    Appearance: Normal appearance.  HENT:     Head: Normocephalic and atraumatic.  Eyes:     Extraocular Movements: Extraocular movements intact.     Pupils: Pupils are equal, round, and reactive to light.  Cardiovascular:     Rate and Rhythm: Normal rate and regular rhythm.     Pulses: Normal pulses.     Heart sounds: Normal heart sounds.  Pulmonary:     Effort: Pulmonary effort is normal. No respiratory distress.     Breath sounds: Normal breath sounds.  Abdominal:     General: Abdomen is flat. Bowel sounds are normal. There is no distension.     Palpations: Abdomen is soft.     Tenderness: There is no abdominal tenderness.  Musculoskeletal:     Cervical  back: Normal range of motion and neck supple.     Left hip: Tenderness and bony tenderness present. Decreased range of motion. Decreased strength.  Lymphadenopathy:     Cervical: No cervical adenopathy.  Skin:    General: Skin is warm and dry.     Findings: No erythema or rash.  Neurological:     General: No focal deficit present.     Mental Status: He is alert and oriented to person, place, and time.  Psychiatric:        Mood and Affect: Mood normal.        Behavior: Behavior normal.     Vital signs in last 24 hours: @VSRANGES @  Labs:   Estimated body mass index is 29.65 kg/m as calculated from the following:   Height as of 06/28/20: 5\' 5"  (1.651 m).   Weight as of 06/28/20: 80.8 kg.   Imaging Review Plain radiographs demonstrate severe degenerative joint disease of the left hip(s). The bone quality appears to be good for age and reported activity level.      Assessment/Plan:  End stage arthritis, left hip(s)  The patient history, physical examination, clinical judgement of the provider and imaging studies are consistent with end stage degenerative joint disease of the left hip(s) and total hip arthroplasty is deemed medically necessary. The treatment options including medical management, injection therapy, arthroscopy and arthroplasty were discussed at length. The risks and benefits of total hip arthroplasty were presented and reviewed. The risks due to aseptic loosening, infection, stiffness, dislocation/subluxation,  thromboembolic complications and other imponderables were discussed.  The patient acknowledged the explanation, agreed to proceed with the plan and consent was signed. Patient is being admitted for inpatient treatment for surgery, pain control, PT, OT, prophylactic antibiotics, VTE prophylaxis, progressive ambulation and ADL's and discharge planning.The patient is planning to be discharged home with outpt PT   Anticipated LOS equal to or greater than 2  midnights due to - Age 50 and older with one or more of the following:  - Obesity  - Expected need for hospital services (PT, OT, Nursing) required for safe  discharge  - Anticipated need for postoperative skilled nursing care or inpatient rehab  - Active co-morbidities: Coronary Artery Disease and Cardiac Arrhythmia OR   - Unanticipated findings during/Post Surgery: None  -  Patient is a high risk of re-admission due to: None

## 2020-09-12 NOTE — Patient Instructions (Addendum)
DUE TO COVID-19 ONLY ONE VISITOR IS ALLOWED TO COME WITH YOU AND STAY IN THE WAITING ROOM ONLY DURING PRE OP AND PROCEDURE DAY OF SURGERY. THE 1 VISITOR  MAY VISIT WITH YOU AFTER SURGERY IN YOUR PRIVATE ROOM DURING VISITING HOURS ONLY!  YOU NEED TO HAVE A COVID 19 TEST ON__12-14_____ @_______ , THIS TEST MUST BE DONE BEFORE SURGERY,  COVID TESTING SITE [address] [place] [employer] , IT IS ON THE RIGHT GOING OUT [address] APPROXIMATELY  2 MINUTES PAST [employer] ON THE RIGHT. ONCE YOUR COVID TEST IS COMPLETED,  PLEASE BEGIN THE QUARANTINE INSTRUCTIONS AS OUTLINED IN YOUR HANDOUT.                Eric Savage  09/12/2020   Your procedure is scheduled on: 09-23-20   Report to [facility] Main  Entrance   Report to admitting at      0800 AM     Call this number if you have problems the morning of surgery [phone]    Remember: NO SOLID FOOD AFTER MIDNIGHT THE NIGHT PRIOR TO SURGERY. NOTHING BY MOUTH EXCEPT CLEAR LIQUIDS UNTIL   0720 am  . PLEASE FINISH ENSURE DRINK PER SURGEON ORDER  WHICH NEEDS TO BE COMPLETED AT  0720 am then nothing by mouth .      CLEAR LIQUID DIET   Foods Allowed                                                                       Black Coffee and tea, regular and decaf                              Plain Jell-O any favor except red or purple                                          Fruit ices (not with fruit pulp)                                    Iced Popsicles                                    Carbonated beverages, regular and diet                                    Cranberry, grape and apple juices Sports drinks like Gatorade Lightly seasoned clear broth or consume(fat free) Sugar, honey syrup   _____________________________________________________________________   BRUSH YOUR TEETH MORNING OF SURGERY AND RINSE YOUR MOUTH OUT, NO CHEWING GUM CANDY OR MINTS.     Take these medicines the morning of surgery  with A SIP OF WATER: metoprolol if needed  You may not have any metal on your body including hair pins and              piercings  Do not wear jewelry,  lotions, powders or perfumes, deodorant                      Men may shave face and neck.   Do not bring valuables to the hospital. [place] IS NOT             RESPONSIBLE   FOR VALUABLES.  Contacts, dentures or bridgework may not be worn into surgery.      Patients discharged the day of surgery will not be allowed to drive home. IF YOU ARE HAVING SURGERY AND GOING HOME THE SAME DAY, YOU MUST HAVE AN ADULT TO DRIVE YOU HOME AND BE WITH YOU FOR 24 HOURS. YOU MAY GO HOME BY TAXI OR UBER OR ORTHERWISE, BUT AN ADULT MUST ACCOMPANY YOU HOME AND STAY WITH YOU FOR 24 HOURS.  Name and phone number of your driver:  Special Instructions: N/A              Please read over the following fact sheets you were given: _____________________________________________________________________             [facility] - Preparing for Surgery Before surgery, you can play an important role.  Because skin is not sterile, your skin needs to be as free of germs as possible.  You can reduce the number of germs on your skin by washing with CHG (chlorahexidine gluconate) soap before surgery.  CHG is an antiseptic cleaner which kills germs and bonds with the skin to continue killing germs even after washing. Please DO NOT use if you have an allergy to CHG or antibacterial soaps.  If your skin becomes reddened/irritated stop using the CHG and inform your nurse when you arrive at Short Stay. Do not shave (including legs and underarms) for at least 48 hours prior to the first CHG shower.  You may shave your face/neck. Please follow these instructions carefully:  1.  Shower with CHG Soap the night before surgery and the  morning of Surgery.  2.  If you choose to wash your hair, wash your hair first as usual with your  normal   shampoo.  3.  After you shampoo, rinse your hair and body thoroughly to remove the  shampoo.                           4.  Use CHG as you would any other liquid soap.  You can apply chg directly  to the skin and wash                       Gently with a scrungie or clean washcloth.  5.  Apply the CHG Soap to your body ONLY FROM THE NECK DOWN.   Do not use on face/ open                           Wound or open sores. Avoid contact with eyes, ears mouth and genitals (private parts).                       Wash face,  Genitals (private parts) with your normal soap.  6.  Wash thoroughly, paying special attention to the area where your surgery  will be performed.  7.  Thoroughly rinse your body with warm water from the neck down.  8.  DO NOT shower/wash with your normal soap after using and rinsing off  the CHG Soap.                9.  Pat yourself dry with a clean towel.            10.  Wear clean pajamas.            11.  Place clean sheets on your bed the night of your first shower and do not  sleep with pets. Day of Surgery : Do not apply any lotions/deodorants the morning of surgery.  Please wear clean clothes to the hospital/surgery center.  FAILURE TO FOLLOW THESE INSTRUCTIONS MAY RESULT IN THE CANCELLATION OF YOUR SURGERY PATIENT SIGNATURE_________________________________  NURSE SIGNATURE__________________________________  ________________________________________________________________________   Eric Savage  An incentive spirometer is a tool that can help keep your lungs clear and active. This tool measures how well you are filling your lungs with each breath. Taking long deep breaths may help reverse or decrease the chance of developing breathing (pulmonary) problems (especially infection) following:  A long period of time when you are unable to move or be active. BEFORE THE PROCEDURE   If the spirometer includes an indicator to show your best effort, your nurse or  respiratory therapist will set it to a desired goal.  If possible, sit up straight or lean slightly forward. Try not to slouch.  Hold the incentive spirometer in an upright position. INSTRUCTIONS FOR USE  1. Sit on the edge of your bed if possible, or sit up as far as you can in bed or on a chair. 2. Hold the incentive spirometer in an upright position. 3. Breathe out normally. 4. Place the mouthpiece in your mouth and seal your lips tightly around it. 5. Breathe in slowly and as deeply as possible, raising the piston or the ball toward the top of the column. 6. Hold your breath for 3-5 seconds or for as long as possible. Allow the piston or ball to fall to the bottom of the column. 7. Remove the mouthpiece from your mouth and breathe out normally. 8. Rest for a few seconds and repeat Steps 1 through 7 at least 10 times every 1-2 hours when you are awake. Take your time and take a few normal breaths between deep breaths. 9. The spirometer may include an indicator to show your best effort. Use the indicator as a goal to work toward during each repetition. 10. After each set of 10 deep breaths, practice coughing to be sure your lungs are clear. If you have an incision (the cut made at the time of surgery), support your incision when coughing by placing a pillow or rolled up towels firmly against it. Once you are able to get out of bed, walk around indoors and cough well. You may stop using the incentive spirometer when instructed by your caregiver.  RISKS AND COMPLICATIONS  Take your time so you do not get dizzy or light-headed.  If you are in pain, you may need to take or ask for pain medication before doing incentive spirometry. It is harder to take a deep breath if you are having pain. AFTER USE  Rest and breathe slowly and easily.  It can be helpful to keep track of a log of your  progress. Your caregiver can provide you with a simple table to help with this. If you are using the  spirometer at home, follow these instructions: [place] IF:   You are having difficultly using the spirometer.  You have trouble using the spirometer as often as instructed.  Your pain medication is not giving enough relief while using the spirometer.  You develop fever of 100.5 F (38.1 C) or higher. SEEK IMMEDIATE MEDICAL CARE IF:   You cough up bloody sputum that had not been present before.  You develop fever of 102 F (38.9 C) or greater.  You develop worsening pain at or near the incision site. MAKE SURE YOU:   Understand these instructions.  Will watch your condition.  Will get help right away if you are not doing well or get worse. Document Released: 02/04/2007 Document Revised: 12/17/2011 Document Reviewed: 04/07/2007 [facility] Patient Information 2014 [place], [place].   ________________________________________________________________________

## 2020-09-12 NOTE — Progress Notes (Signed)
Please place orders in epic pt. Has a preop 09-14-20

## 2020-09-13 ENCOUNTER — Ambulatory Visit: Payer: 59 | Admitting: Neurology

## 2020-09-14 ENCOUNTER — Other Ambulatory Visit: Payer: Self-pay

## 2020-09-14 ENCOUNTER — Encounter (HOSPITAL_COMMUNITY): Payer: Self-pay

## 2020-09-14 ENCOUNTER — Encounter (HOSPITAL_COMMUNITY)
Admission: RE | Admit: 2020-09-14 | Discharge: 2020-09-14 | Disposition: A | Discharge status: Home / Self Care | Payer: 59 | Source: Ambulatory Visit | Attending: Orthopedic Surgery | Admitting: Orthopedic Surgery

## 2020-09-14 DIAGNOSIS — Z01812 Encounter for preprocedural laboratory examination: Secondary | ICD-10-CM | POA: Diagnosis present

## 2020-09-14 LAB — COMPREHENSIVE METABOLIC PANEL
ALT: 21 U/L (ref 0–44)
AST: 17 U/L (ref 15–41)
Albumin: 4 g/dL (ref 3.5–5.0)
Alkaline Phosphatase: 81 U/L (ref 38–126)
Anion gap: 8 (ref 5–15)
BUN: 13 mg/dL (ref 6–20)
CO2: 26 mmol/L (ref 22–32)
Calcium: 9 mg/dL (ref 8.9–10.3)
Chloride: 104 mmol/L (ref 98–111)
Creatinine, Ser: 1.04 mg/dL (ref 0.61–1.24)
GFR, Estimated: >60 mL/min (ref >60)
Glucose, Bld: 135 mg/dL — ABNORMAL HIGH (ref 70–99)
Potassium: 4.2 mmol/L (ref 3.5–5.1)
Sodium: 138 mmol/L (ref 135–145)
Total Bilirubin: 0.6 mg/dL (ref 0.3–1.2)
Total Protein: 6.2 g/dL — ABNORMAL LOW (ref 6.5–8.1)

## 2020-09-14 LAB — URINALYSIS, ROUTINE W REFLEX MICROSCOPIC
Bilirubin Urine: NEGATIVE
Glucose, UA: NEGATIVE mg/dL
Hgb urine dipstick: NEGATIVE
Ketones, ur: NEGATIVE mg/dL
Leukocytes,Ua: NEGATIVE
Nitrite: NEGATIVE
Protein, ur: NEGATIVE mg/dL
Specific Gravity, Urine: 1.011 (ref 1.005–1.030)
pH: 8 (ref 5.0–8.0)

## 2020-09-14 LAB — APTT: aPTT: 41 seconds — ABNORMAL HIGH (ref 24–36)

## 2020-09-14 LAB — CBC WITH DIFFERENTIAL/PLATELET
Abs Immature Granulocytes: 0.02 10*3/uL (ref 0.00–0.07)
Basophils Absolute: 0 10*3/uL (ref 0.0–0.1)
Basophils Relative: 0 %
Eosinophils Absolute: 0.1 10*3/uL (ref 0.0–0.5)
Eosinophils Relative: 1 %
HCT: 42.5 % (ref 39.0–52.0)
Hemoglobin: 14.1 g/dL (ref 13.0–17.0)
Immature Granulocytes: 0 %
Lymphocytes Relative: 23 %
Lymphs Abs: 1.7 10*3/uL (ref 0.7–4.0)
MCH: 31.1 pg (ref 26.0–34.0)
MCHC: 33.2 g/dL (ref 30.0–36.0)
MCV: 93.6 fL (ref 80.0–100.0)
Monocytes Absolute: 0.5 10*3/uL (ref 0.1–1.0)
Monocytes Relative: 7 %
Neutro Abs: 5 10*3/uL (ref 1.7–7.7)
Neutrophils Relative %: 69 %
Platelets: 188 10*3/uL (ref 150–400)
RBC: 4.54 MIL/uL (ref 4.22–5.81)
RDW: 11.8 % (ref 11.5–15.5)
WBC: 7.3 10*3/uL (ref 4.0–10.5)
nRBC: 0 % (ref 0.0–0.2)

## 2020-09-14 LAB — PROTIME-INR
INR: 1.5 — ABNORMAL HIGH (ref 0.8–1.2)
Prothrombin Time: 17.6 seconds — ABNORMAL HIGH (ref 11.4–15.2)

## 2020-09-14 LAB — SURGICAL PCR SCREEN
MRSA, PCR: NEGATIVE
Staphylococcus aureus: NEGATIVE

## 2020-09-15 LAB — URINE CULTURE: Culture: NO GROWTH

## 2020-09-19 NOTE — Progress Notes (Signed)
Anesthesia Chart Review   Case: [id number] Date/Time: 09/23/20 1012   Procedure: TOTAL HIP ARTHROPLASTY (Left Hip)   Anesthesia type: Choice   Pre-op diagnosis: OA LEFT HIP   [facility] ROOM 05 / [facility]   Surgeons: Frederico Hamman, MD      DISCUSSION:54 y.o. current every day smoker with h/o HTN, CAD (DES), a-fib (Xarelto), left hip OA scheduled for above procedure 09/23/2020 with Dr. Frederico Hamman.   Per cardiology preoperative risk assessment 08/19/20,  "Chart reviewed as part of pre-operative protocol coverage. Given past medical history and time since last visit, based on ACC/AHA guidelines, Eric Savage would be at acceptable risk for the planned procedure without further cardiovascular testing.  Patient with diagnosis ofafibon Xareltofor anticoagulation.  Procedure:Lt Total Hip replacement Date of procedure:TBD  CHA2DS2-VAScScore = 2 This indicates a2.2% annual risk of stroke. The patient's score is based upon: CHF History: 0 HTN History: 1 Diabetes History: 0 Stroke History: 0 Vascular Disease History: 1 Age Score: 0 Gender Score: 0 CrCl68.9 Per office protocol, patient can holdXareltofor 3days prior to procedure."  Anticipate pt can proceed with planned procedure barring acute status change.   VS: BP (!) 161/78   Pulse (!) 50   Temp 36.7 C (Oral)   Resp 16   Ht 5\' 5"  (1.651 m)   Wt 83.9 kg   SpO2 98%   BMI 30.79 kg/m   PROVIDERS: , NP is PCP   Marva Panda, MD is Cardiologist  LABS: Labs reviewed: Acceptable for surgery. (all labs ordered are listed, but only abnormal results are displayed)  Labs Reviewed  APTT - Abnormal; Notable for the following components:      Result Value   aPTT 41 (*)    All other components within normal limits  COMPREHENSIVE METABOLIC PANEL - Abnormal; Notable for the following components:   Glucose, Bld 135 (*)    Total Protein 6.2 (*)    All other components within normal limits   PROTIME-INR - Abnormal; Notable for the following components:   Prothrombin Time 17.6 (*)    INR 1.5 (*)    All other components within normal limits  URINE CULTURE  SURGICAL PCR SCREEN  CBC WITH DIFFERENTIAL/PLATELET  URINALYSIS, ROUTINE W REFLEX MICROSCOPIC  TYPE AND SCREEN     IMAGES:   EKG: 06/28/20 Rate 55 bpm  Sinus bradycardia Otherwise normal ECG SR replaces afib since 06/22/2020  CV: Echo 08/17/2020 IMPRESSIONS    1. Left ventricular ejection fraction, by estimation, is 55 to 60%. The  left ventricle has normal function. The left ventricle has no regional  wall motion abnormalities. Left ventricular diastolic parameters are  indeterminate.  2. Right ventricular systolic function is normal. The right ventricular  size is normal.  3. The mitral valve is grossly normal. Mild to moderate mitral valve  regurgitation.  4. The aortic valve is normal in structure. Aortic valve regurgitation is  not visualized.  5. The inferior vena cava is normal in size with <50% respiratory  variability, suggesting right atrial pressure of 8 mmHg.  Myocardial Perfusion 08/28/2019  The left ventricular ejection fraction is normal (55-65%).  Nuclear stress EF: 56%.  There was no ST segment deviation noted during stress.  The study is normal.  This is a low risk study.   Past Medical History:  Diagnosis Date  . Acid reflux   . Anxiety   . Arthritis   . CAD S/P percutaneous coronary angioplasty 12/10   RCA/CFX DES  .  Chest pain 01/2013   Evaluate cardiac catheterization, no obstructive CAD  . Diverticulitis   . Dysrhythmia    OCC PALPITATION   . High cholesterol   . History of kidney stones   . Hypertension     Past Surgical History:  Procedure Laterality Date  . CHOLECYSTECTOMY N/A 08/04/2018   Procedure: LAPAROSCOPIC CHOLECYSTECTOMY;  Surgeon: Violeta Gelinas, MD;  Location: [facility] OR;  Service: General;  Laterality: N/A;  . choleycystectomy  2020  .  COLONOSCOPY    . CORONARY ANGIOPLASTY WITH STENT PLACEMENT  09/2009   Proximal RCA (95%-60%) - Cypher DES 3.0 mm but 33 mm --> 3.75 mm; pCFX 80%: PCI - XIENCE V. DES 2.75 mm x 15 mm --> 3.0 mm  . EXTRACORPOREAL SHOCK WAVE LITHOTRIPSY Left 06/27/2020   Procedure: LEFT EXTRACORPOREAL SHOCK WAVE LITHOTRIPSY (ESWL);  Surgeon: Bjorn Pippin, MD;  [facility]: [facility];  Service: Urology;  Laterality: Left;  . LAPAROSCOPIC SIGMOID COLECTOMY N/A 06/05/2017   Procedure: SIGMOID COLECTOMY;  Surgeon: Violeta Gelinas, MD;  Location: [facility] OR;  Service: General;  Laterality: N/A;  . LEFT HEART CATHETERIZATION WITH CORONARY ANGIOGRAM N/A 05/19/2012   Procedure: LEFT HEART CATHETERIZATION WITH CORONARY ANGIOGRAM;  Surgeon: Marykay Lex, MD;  Location: [facility] CATH LAB;::  Cx & RCA stents patent. Moderate D1 & D2 as well as ostial AVG Cx.    Marland Kitchen LEFT HEART CATHETERIZATION WITH CORONARY ANGIOGRAM N/A 01/28/2013   Procedure: LEFT HEART CATHETERIZATION WITH CORONARY ANGIOGRAM;  Surgeon: Lennette Bihari, MD;  Location: [facility] CATH LAB;  Service: Cardiovascular:  No evidence for restenosis of either RCA or LCX. Mild LAD 20% narrowing and 60 - 70% smooth mid diagonal 2 stenosis.  Marland Kitchen NM MYOCAR PERF WALL MOTION  01/04/2011   Protocol:Bruce, post stress EF=69%, Exercise Cap 13 METS, attenuation defect in inferior region of myocardium, no sig. ischemia demonstrated  . TENNIS ELBOW RELEASE/NIRSCHEL PROCEDURE Right 12/16/2019   Procedure: DEBRIDEMENT OF RIGHT ELBOW, EXCISION OSTEOPHYTES;  Surgeon: Bjorn Pippin, MD;  Location: [facility];  Service: Orthopedics;  Laterality: Right;  . TONSILLECTOMY  ~ 1973  . TRANSTHORACIC ECHOCARDIOGRAM  05/13/2009   EF =>55%, mild mitral regurg, trace tricuspid regurg.  Marland Kitchen ULNAR NERVE TRANSPOSITION Right 12/16/2019   Procedure: ULNAR NERVE DECOMPRESSION/TRANSPOSITION;  Surgeon: Bjorn Pippin, MD;  Location: [facility];  Service: Orthopedics;  Laterality: Right;     MEDICATIONS: . acetaminophen (TYLENOL) 500 MG tablet  . ALPRAZolam (XANAX) 0.5 MG tablet  . aspirin EC 81 MG tablet  . atorvastatin (LIPITOR) 40 MG tablet  . cephALEXin (KEFLEX) 500 MG capsule  . lisinopril (ZESTRIL) 20 MG tablet  . metoprolol succinate (TOPROL XL) 25 MG 24 hr tablet  . metoprolol tartrate (LOPRESSOR) 25 MG tablet  . omeprazole (PRILOSEC) 40 MG capsule  . oxyCODONE-acetaminophen (PERCOCET) 10-325 MG tablet  . XARELTO 20 MG TABS tablet   No current facility-administered medications for this encounter.   . sulfamethoxazole-trimethoprim (BACTRIM DS) 800-160 MG per tablet 1 tablet  . sulfamethoxazole-trimethoprim (BACTRIM DS) 800-160 MG per tablet 1 tablet   Jodell Cipro, PA-C [facility] Pre-Surgical Testing [phone]

## 2020-09-19 NOTE — Anesthesia Preprocedure Evaluation (Addendum)
Anesthesia Evaluation  Patient identified by MRN, date of birth, ID band Patient awake    Reviewed: Allergy & Precautions, NPO status , Patient's Chart, lab work & pertinent test results, reviewed documented beta blocker date and time   Airway Mallampati: II  TM Distance: >3 FB Neck ROM: Full    Dental  (+) Teeth Intact,    Pulmonary Current SmokerPatient did not abstain from smoking.,    Pulmonary exam normal breath sounds clear to auscultation       Cardiovascular hypertension, Pt. on home beta blockers (-) angina+ CAD and + Cardiac Stents  Normal cardiovascular exam+ dysrhythmias Atrial Fibrillation  Rhythm:Regular Rate:Normal  Echo 08/17/20: 1. Left ventricular ejection fraction, by estimation, is 55 to 60%. The left ventricle has normal function. The left ventricle has no regional wall motion abnormalities. Left ventricular diastolic parameters are indeterminate.  2. Right ventricular systolic function is normal. The right ventricular size is normal.  3. The mitral valve is grossly normal. Mild to moderate mitral valve regurgitation.  4. The aortic valve is normal in structure. Aortic valve regurgitation is not visualized.  5. The inferior vena cava is normal in size with <50% respiratory variability, suggesting right atrial pressure of 8 mmHg.    Neuro/Psych PSYCHIATRIC DISORDERS Anxiety negative neurological ROS     GI/Hepatic GERD  Medicated,(+)     substance abuse  alcohol use,   Endo/Other  Obesity   Renal/GU negative Renal ROS     Musculoskeletal  (+) Arthritis ,   Abdominal   Peds  Hematology  (+) Blood dyscrasia (Xarelto), ,   Anesthesia Other Findings Day of surgery medications reviewed with the patient.  Reproductive/Obstetrics                          Anesthesia Physical Anesthesia Plan  ASA: III  Anesthesia Plan: General   Post-op Pain Management:    Induction:  Intravenous  PONV Risk Score and Plan: 1 and Midazolam, Dexamethasone and Ondansetron  Airway Management Planned: Oral ETT  Additional Equipment:   Intra-op Plan:   Post-operative Plan:   Informed Consent: I have reviewed the patients History and Physical, chart, labs and discussed the procedure including the risks, benefits and alternatives for the proposed anesthesia with the patient or authorized representative who has indicated his/her understanding and acceptance.       Plan Discussed with: CRNA, Anesthesiologist and Surgeon  Anesthesia Plan Comments: (See PAT note 09/14/2020, Jodell Cipro, PA-C)      Anesthesia Quick Evaluation

## 2020-09-20 ENCOUNTER — Other Ambulatory Visit (HOSPITAL_COMMUNITY)
Admission: RE | Admit: 2020-09-20 | Discharge: 2020-09-20 | Disposition: A | Discharge status: Home / Self Care | Payer: 59 | Source: Ambulatory Visit | Attending: Orthopedic Surgery | Admitting: Orthopedic Surgery

## 2020-09-20 DIAGNOSIS — Z01812 Encounter for preprocedural laboratory examination: Secondary | ICD-10-CM | POA: Diagnosis present

## 2020-09-20 DIAGNOSIS — Z20822 Contact with and (suspected) exposure to covid-19: Secondary | ICD-10-CM | POA: Insufficient documentation

## 2020-09-20 LAB — SARS CORONAVIRUS 2 (TAT 6-24 HRS): SARS Coronavirus 2: NEGATIVE

## 2020-09-22 MED ORDER — TRANEXAMIC ACID 1000 MG/10ML IV SOLN
2000.0000 mg | INTRAVENOUS | Status: AC
  Filled 2020-09-22: qty 20

## 2020-09-22 MED ORDER — BUPIVACAINE LIPOSOME 1.3 % IJ SUSP
10.0000 mL | INTRAMUSCULAR | Status: AC
  Filled 2020-09-22: qty 10

## 2020-09-23 ENCOUNTER — Other Ambulatory Visit: Payer: Self-pay

## 2020-09-23 ENCOUNTER — Ambulatory Visit (HOSPITAL_COMMUNITY)
Admission: RE | Admit: 2020-09-23 | Discharge: 2020-09-24 | Disposition: A | Discharge status: Home / Self Care | Payer: 59 | Source: Ambulatory Visit | Attending: Orthopedic Surgery | Admitting: Orthopedic Surgery

## 2020-09-23 ENCOUNTER — Ambulatory Visit (HOSPITAL_COMMUNITY): Payer: 59 | Admitting: Physician Assistant

## 2020-09-23 ENCOUNTER — Ambulatory Visit (HOSPITAL_COMMUNITY): Payer: 59

## 2020-09-23 ENCOUNTER — Encounter (HOSPITAL_COMMUNITY): Payer: Self-pay | Admitting: Orthopedic Surgery

## 2020-09-23 ENCOUNTER — Ambulatory Visit (HOSPITAL_COMMUNITY): Payer: 59 | Admitting: Anesthesiology

## 2020-09-23 ENCOUNTER — Encounter (HOSPITAL_COMMUNITY)
Admission: RE | Disposition: A | Discharge status: Home / Self Care | Payer: Self-pay | Source: Ambulatory Visit | Attending: Orthopedic Surgery

## 2020-09-23 DIAGNOSIS — Z96642 Presence of left artificial hip joint: Secondary | ICD-10-CM

## 2020-09-23 DIAGNOSIS — M1612 Unilateral primary osteoarthritis, left hip: Secondary | ICD-10-CM | POA: Diagnosis present

## 2020-09-23 DIAGNOSIS — Z7901 Long term (current) use of anticoagulants: Secondary | ICD-10-CM | POA: Diagnosis not present

## 2020-09-23 DIAGNOSIS — Z79899 Other long term (current) drug therapy: Secondary | ICD-10-CM | POA: Insufficient documentation

## 2020-09-23 DIAGNOSIS — F17210 Nicotine dependence, cigarettes, uncomplicated: Secondary | ICD-10-CM | POA: Insufficient documentation

## 2020-09-23 DIAGNOSIS — E669 Obesity, unspecified: Secondary | ICD-10-CM | POA: Diagnosis not present

## 2020-09-23 DIAGNOSIS — I2510 Atherosclerotic heart disease of native coronary artery without angina pectoris: Secondary | ICD-10-CM | POA: Insufficient documentation

## 2020-09-23 DIAGNOSIS — Z955 Presence of coronary angioplasty implant and graft: Secondary | ICD-10-CM | POA: Insufficient documentation

## 2020-09-23 DIAGNOSIS — Z6830 Body mass index (BMI) 30.0-30.9, adult: Secondary | ICD-10-CM | POA: Insufficient documentation

## 2020-09-23 DIAGNOSIS — Z7982 Long term (current) use of aspirin: Secondary | ICD-10-CM | POA: Diagnosis not present

## 2020-09-23 HISTORY — PX: TOTAL HIP ARTHROPLASTY: SHX124

## 2020-09-23 LAB — TYPE AND SCREEN
ABO/RH(D): O POS
Antibody Screen: NEGATIVE

## 2020-09-23 SURGERY — ARTHROPLASTY, HIP, TOTAL,POSTERIOR APPROACH
Anesthesia: General | Site: Hip | Laterality: Left

## 2020-09-23 MED ORDER — METOPROLOL TARTRATE 5 MG/5ML IV SOLN
INTRAVENOUS | Status: DC | PRN
  Administered 2020-09-23: 1 mg via INTRAVENOUS

## 2020-09-23 MED ORDER — MIDAZOLAM HCL 2 MG/2ML IJ SOLN
INTRAMUSCULAR | Status: AC
  Filled 2020-09-23: qty 2

## 2020-09-23 MED ORDER — ROCURONIUM BROMIDE 10 MG/ML (PF) SYRINGE
PREFILLED_SYRINGE | INTRAVENOUS | Status: AC
  Filled 2020-09-23: qty 10

## 2020-09-23 MED ORDER — METOCLOPRAMIDE HCL 5 MG/ML IJ SOLN: 5.0000 mg | Freq: Three times a day (TID) | INTRAMUSCULAR | Status: DC | PRN

## 2020-09-23 MED ORDER — BUPIVACAINE-EPINEPHRINE 0.25% -1:200000 IJ SOLN
INTRAMUSCULAR | Status: DC | PRN
  Administered 2020-09-23: 20 mL

## 2020-09-23 MED ORDER — PROPOFOL 10 MG/ML IV BOLUS
INTRAVENOUS | Status: AC
  Filled 2020-09-23: qty 20

## 2020-09-23 MED ORDER — FENTANYL CITRATE (PF) 100 MCG/2ML IJ SOLN
INTRAMUSCULAR | Status: DC | PRN
  Administered 2020-09-23: 100 ug via INTRAVENOUS
  Administered 2020-09-23: 50 ug via INTRAVENOUS
  Administered 2020-09-23 (×2): 100 ug via INTRAVENOUS
  Administered 2020-09-23: 50 ug via INTRAVENOUS

## 2020-09-23 MED ORDER — SODIUM CHLORIDE 0.9 % IV SOLN: INTRAVENOUS | Status: DC

## 2020-09-23 MED ORDER — METOPROLOL SUCCINATE ER 25 MG PO TB24
25.0000 mg | ORAL_TABLET | Freq: Every day | ORAL | Status: DC
  Administered 2020-09-23: 25 mg via ORAL
  Filled 2020-09-23: qty 1

## 2020-09-23 MED ORDER — PROMETHAZINE HCL 25 MG/ML IJ SOLN
6.2500 mg | INTRAMUSCULAR | Status: DC | PRN
  Administered 2020-09-23: 12.5 mg via INTRAVENOUS

## 2020-09-23 MED ORDER — ATORVASTATIN CALCIUM 40 MG PO TABS
40.0000 mg | ORAL_TABLET | Freq: Every day | ORAL | Status: DC
  Administered 2020-09-23: 40 mg via ORAL
  Filled 2020-09-23: qty 1

## 2020-09-23 MED ORDER — SUGAMMADEX SODIUM 200 MG/2ML IV SOLN
INTRAVENOUS | Status: DC | PRN
  Administered 2020-09-23: 200 mg via INTRAVENOUS

## 2020-09-23 MED ORDER — WATER FOR IRRIGATION, STERILE IR SOLN
Status: DC | PRN
  Administered 2020-09-23: 1000 mL

## 2020-09-23 MED ORDER — PROMETHAZINE HCL 25 MG/ML IJ SOLN
INTRAMUSCULAR | Status: AC
  Filled 2020-09-23: qty 1

## 2020-09-23 MED ORDER — ALPRAZOLAM 1 MG PO TABS
1.0000 mg | ORAL_TABLET | Freq: Every day | ORAL | Status: DC
  Administered 2020-09-23: 1 mg via ORAL
  Filled 2020-09-23: qty 1

## 2020-09-23 MED ORDER — METHOCARBAMOL 500 MG PO TABS
500.0000 mg | ORAL_TABLET | Freq: Four times a day (QID) | ORAL | Status: DC | PRN
  Administered 2020-09-23 – 2020-09-24 (×3): 500 mg via ORAL
  Filled 2020-09-23 (×3): qty 1

## 2020-09-23 MED ORDER — ROCURONIUM BROMIDE 10 MG/ML (PF) SYRINGE
PREFILLED_SYRINGE | INTRAVENOUS | Status: DC | PRN
  Administered 2020-09-23: 30 mg via INTRAVENOUS
  Administered 2020-09-23: 70 mg via INTRAVENOUS

## 2020-09-23 MED ORDER — FENTANYL CITRATE (PF) 100 MCG/2ML IJ SOLN
INTRAMUSCULAR | Status: AC
  Filled 2020-09-23: qty 2

## 2020-09-23 MED ORDER — ASPIRIN EC 81 MG PO TBEC: 81.0000 mg | DELAYED_RELEASE_TABLET | Freq: Every day | ORAL | Status: DC

## 2020-09-23 MED ORDER — HYDROMORPHONE HCL 1 MG/ML IJ SOLN
0.5000 mg | INTRAMUSCULAR | Status: DC | PRN
  Administered 2020-09-23 (×2): 1 mg via INTRAVENOUS
  Filled 2020-09-23 (×2): qty 1

## 2020-09-23 MED ORDER — SODIUM CHLORIDE (PF) 0.9 % IJ SOLN
INTRAMUSCULAR | Status: DC | PRN
  Administered 2020-09-23: 20 mL

## 2020-09-23 MED ORDER — PHENYLEPHRINE 40 MCG/ML (10ML) SYRINGE FOR IV PUSH (FOR BLOOD PRESSURE SUPPORT)
PREFILLED_SYRINGE | INTRAVENOUS | Status: DC | PRN
  Administered 2020-09-23: 80 ug via INTRAVENOUS
  Administered 2020-09-23: 160 ug via INTRAVENOUS
  Administered 2020-09-23: 80 ug via INTRAVENOUS

## 2020-09-23 MED ORDER — ONDANSETRON HCL 4 MG/2ML IJ SOLN: 4.0000 mg | Freq: Four times a day (QID) | INTRAMUSCULAR | Status: DC | PRN

## 2020-09-23 MED ORDER — HYDROMORPHONE HCL 1 MG/ML IJ SOLN
INTRAMUSCULAR | Status: AC
  Filled 2020-09-23: qty 1

## 2020-09-23 MED ORDER — FLEET ENEMA 7-19 GM/118ML RE ENEM: 1.0000 | ENEMA | Freq: Once | RECTAL | Status: DC | PRN

## 2020-09-23 MED ORDER — GABAPENTIN 300 MG PO CAPS
300.0000 mg | ORAL_CAPSULE | Freq: Once | ORAL | Status: AC
  Administered 2020-09-23: 300 mg via ORAL
  Filled 2020-09-23: qty 1

## 2020-09-23 MED ORDER — ACETAMINOPHEN 325 MG PO TABS: 325.0000 mg | ORAL_TABLET | Freq: Four times a day (QID) | ORAL | Status: DC | PRN

## 2020-09-23 MED ORDER — KETOROLAC TROMETHAMINE 30 MG/ML IJ SOLN
30.0000 mg | Freq: Once | INTRAMUSCULAR | Status: AC
  Administered 2020-09-23: 30 mg via INTRAVENOUS

## 2020-09-23 MED ORDER — MIDAZOLAM HCL 5 MG/5ML IJ SOLN
INTRAMUSCULAR | Status: DC | PRN
  Administered 2020-09-23: 2 mg via INTRAVENOUS

## 2020-09-23 MED ORDER — MENTHOL 3 MG MT LOZG
1.0000 | LOZENGE | OROMUCOSAL | Status: DC | PRN
  Administered 2020-09-24: 3 mg via ORAL
  Filled 2020-09-23: qty 9

## 2020-09-23 MED ORDER — 0.9 % SODIUM CHLORIDE (POUR BTL) OPTIME
TOPICAL | Status: DC | PRN
  Administered 2020-09-23: 1000 mL

## 2020-09-23 MED ORDER — DIPHENHYDRAMINE HCL 12.5 MG/5ML PO ELIX
12.5000 mg | ORAL_SOLUTION | ORAL | Status: DC | PRN
Start: 2020-09-23 — End: 2020-09-24

## 2020-09-23 MED ORDER — ORAL CARE MOUTH RINSE: 15.0000 mL | Freq: Once | OROMUCOSAL | Status: AC

## 2020-09-23 MED ORDER — FENTANYL CITRATE (PF) 100 MCG/2ML IJ SOLN
25.0000 ug | INTRAMUSCULAR | Status: DC | PRN
  Administered 2020-09-23 (×3): 50 ug via INTRAVENOUS

## 2020-09-23 MED ORDER — SORBITOL 70 % SOLN
30.0000 mL | Freq: Every day | Status: DC | PRN
  Filled 2020-09-23: qty 30

## 2020-09-23 MED ORDER — TRANEXAMIC ACID-NACL 1000-0.7 MG/100ML-% IV SOLN
1000.0000 mg | INTRAVENOUS | Status: AC
  Administered 2020-09-23: 1000 mg via INTRAVENOUS
  Filled 2020-09-23: qty 100

## 2020-09-23 MED ORDER — LIDOCAINE 2% (20 MG/ML) 5 ML SYRINGE
INTRAMUSCULAR | Status: DC | PRN
  Administered 2020-09-23: 80 mg via INTRAVENOUS

## 2020-09-23 MED ORDER — METHOCARBAMOL 500 MG IVPB - SIMPLE MED
500.0000 mg | Freq: Four times a day (QID) | INTRAVENOUS | Status: DC | PRN
  Administered 2020-09-23: 500 mg via INTRAVENOUS
  Filled 2020-09-23: qty 50

## 2020-09-23 MED ORDER — CHLORHEXIDINE GLUCONATE 0.12 % MT SOLN
15.0000 mL | Freq: Once | OROMUCOSAL | Status: AC
  Administered 2020-09-23: 15 mL via OROMUCOSAL

## 2020-09-23 MED ORDER — SENNOSIDES-DOCUSATE SODIUM 8.6-50 MG PO TABS: 1.0000 | ORAL_TABLET | Freq: Every evening | ORAL | Status: DC | PRN

## 2020-09-23 MED ORDER — DOCUSATE SODIUM 100 MG PO CAPS
100.0000 mg | ORAL_CAPSULE | Freq: Two times a day (BID) | ORAL | Status: DC
  Administered 2020-09-23 – 2020-09-24 (×2): 100 mg via ORAL
  Filled 2020-09-23 (×2): qty 1

## 2020-09-23 MED ORDER — DEXAMETHASONE SODIUM PHOSPHATE 4 MG/ML IJ SOLN
INTRAMUSCULAR | Status: DC | PRN
  Administered 2020-09-23: 10 mg via INTRAVENOUS

## 2020-09-23 MED ORDER — RIVAROXABAN 20 MG PO TABS
20.0000 mg | ORAL_TABLET | Freq: Every evening | ORAL | Status: DC
  Filled 2020-09-23: qty 1

## 2020-09-23 MED ORDER — ONDANSETRON HCL 4 MG PO TABS: 4.0000 mg | ORAL_TABLET | Freq: Four times a day (QID) | ORAL | Status: DC | PRN

## 2020-09-23 MED ORDER — ONDANSETRON HCL 4 MG/2ML IJ SOLN
INTRAMUSCULAR | Status: DC | PRN
  Administered 2020-09-23: 4 mg via INTRAVENOUS

## 2020-09-23 MED ORDER — HYDROMORPHONE HCL 1 MG/ML IJ SOLN
0.2500 mg | INTRAMUSCULAR | Status: DC | PRN
  Administered 2020-09-23: 0.5 mg via INTRAVENOUS

## 2020-09-23 MED ORDER — DEXAMETHASONE SODIUM PHOSPHATE 10 MG/ML IJ SOLN
INTRAMUSCULAR | Status: AC
  Filled 2020-09-23: qty 1

## 2020-09-23 MED ORDER — PROPOFOL 10 MG/ML IV BOLUS
INTRAVENOUS | Status: DC | PRN
  Administered 2020-09-23: 170 mg via INTRAVENOUS

## 2020-09-23 MED ORDER — LIDOCAINE HCL (PF) 2 % IJ SOLN
INTRAMUSCULAR | Status: AC
  Filled 2020-09-23: qty 5

## 2020-09-23 MED ORDER — ONDANSETRON HCL 4 MG/2ML IJ SOLN
INTRAMUSCULAR | Status: AC
  Filled 2020-09-23: qty 2

## 2020-09-23 MED ORDER — CEFAZOLIN SODIUM-DEXTROSE 2-4 GM/100ML-% IV SOLN
2.0000 g | INTRAVENOUS | Status: AC
  Administered 2020-09-23: 2 g via INTRAVENOUS
  Filled 2020-09-23: qty 100

## 2020-09-23 MED ORDER — METHOCARBAMOL 500 MG PO TABS: 500.0000 mg | ORAL_TABLET | Freq: Four times a day (QID) | ORAL | 0 refills | Status: DC | PRN

## 2020-09-23 MED ORDER — METOPROLOL TARTRATE 25 MG PO TABS: 25.0000 mg | ORAL_TABLET | Freq: Four times a day (QID) | ORAL | Status: DC | PRN

## 2020-09-23 MED ORDER — METOCLOPRAMIDE HCL 5 MG PO TABS: 5.0000 mg | ORAL_TABLET | Freq: Three times a day (TID) | ORAL | Status: DC | PRN

## 2020-09-23 MED ORDER — KETAMINE HCL 10 MG/ML IJ SOLN
INTRAMUSCULAR | Status: AC
  Filled 2020-09-23: qty 1

## 2020-09-23 MED ORDER — PHENOL 1.4 % MT LIQD: 1.0000 | OROMUCOSAL | Status: DC | PRN

## 2020-09-23 MED ORDER — ACETAMINOPHEN 500 MG PO TABS
1000.0000 mg | ORAL_TABLET | Freq: Once | ORAL | Status: AC
  Administered 2020-09-23: 1000 mg via ORAL
  Filled 2020-09-23: qty 2

## 2020-09-23 MED ORDER — ACETAMINOPHEN 500 MG PO TABS
1000.0000 mg | ORAL_TABLET | Freq: Four times a day (QID) | ORAL | Status: AC
  Administered 2020-09-23 – 2020-09-24 (×4): 1000 mg via ORAL
  Filled 2020-09-23 (×4): qty 2

## 2020-09-23 MED ORDER — SODIUM CHLORIDE (PF) 0.9 % IJ SOLN
INTRAMUSCULAR | Status: AC
  Filled 2020-09-23: qty 20

## 2020-09-23 MED ORDER — LISINOPRIL 20 MG PO TABS
20.0000 mg | ORAL_TABLET | Freq: Every evening | ORAL | Status: DC
  Administered 2020-09-23: 20 mg via ORAL
  Filled 2020-09-23: qty 1

## 2020-09-23 MED ORDER — TRANEXAMIC ACID 1000 MG/10ML IV SOLN
INTRAVENOUS | Status: DC | PRN
  Administered 2020-09-23: 2000 mg via TOPICAL

## 2020-09-23 MED ORDER — FENTANYL CITRATE (PF) 250 MCG/5ML IJ SOLN
INTRAMUSCULAR | Status: AC
  Filled 2020-09-23: qty 5

## 2020-09-23 MED ORDER — KETAMINE HCL 10 MG/ML IJ SOLN
INTRAMUSCULAR | Status: DC | PRN
  Administered 2020-09-23: 30 mg via INTRAVENOUS
  Administered 2020-09-23: 10 mg via INTRAVENOUS

## 2020-09-23 MED ORDER — OXYCODONE HCL 5 MG PO TABS
5.0000 mg | ORAL_TABLET | ORAL | Status: DC | PRN
  Administered 2020-09-23 – 2020-09-24 (×5): 10 mg via ORAL
  Filled 2020-09-23 (×5): qty 2

## 2020-09-23 MED ORDER — PANTOPRAZOLE SODIUM 40 MG PO TBEC
80.0000 mg | DELAYED_RELEASE_TABLET | Freq: Every day | ORAL | Status: DC
  Administered 2020-09-23: 80 mg via ORAL
  Filled 2020-09-23: qty 2

## 2020-09-23 MED ORDER — POVIDONE-IODINE 10 % EX SWAB
2.0000 "application " | Freq: Once | CUTANEOUS | Status: AC
  Administered 2020-09-23: 2 via TOPICAL

## 2020-09-23 MED ORDER — DOCUSATE SODIUM 100 MG PO CAPS: 100.0000 mg | ORAL_CAPSULE | Freq: Every day | ORAL | 2 refills | Status: AC | PRN

## 2020-09-23 MED ORDER — BUPIVACAINE LIPOSOME 1.3 % IJ SUSP
INTRAMUSCULAR | Status: DC | PRN
  Administered 2020-09-23: 10 mL

## 2020-09-23 MED ORDER — OXYCODONE HCL 5 MG PO TABS: ORAL_TABLET | ORAL | 0 refills | Status: DC

## 2020-09-23 MED ORDER — TRANEXAMIC ACID-NACL 1000-0.7 MG/100ML-% IV SOLN
1000.0000 mg | Freq: Once | INTRAVENOUS | Status: AC
  Administered 2020-09-23: 1000 mg via INTRAVENOUS
  Filled 2020-09-23: qty 100

## 2020-09-23 MED ORDER — METOPROLOL TARTRATE 5 MG/5ML IV SOLN
INTRAVENOUS | Status: AC
  Filled 2020-09-23: qty 5

## 2020-09-23 MED ORDER — CEFAZOLIN SODIUM-DEXTROSE 1-4 GM/50ML-% IV SOLN
1.0000 g | Freq: Four times a day (QID) | INTRAVENOUS | Status: AC
  Administered 2020-09-23 (×2): 1 g via INTRAVENOUS
  Filled 2020-09-23 (×2): qty 50

## 2020-09-23 MED ORDER — LACTATED RINGERS IV SOLN: INTRAVENOUS | Status: DC

## 2020-09-23 MED ORDER — METHOCARBAMOL 500 MG IVPB - SIMPLE MED
INTRAVENOUS | Status: AC
  Filled 2020-09-23: qty 50

## 2020-09-23 SURGICAL SUPPLY — 61 items
AML 10.5 STD 6.3 5/8 SML 6IN ×3 IMPLANT
APL SKNCLS STERI-STRIP NONHPOA (GAUZE/BANDAGES/DRESSINGS) ×1
BAG DECANTER FOR FLEXI CONT (MISCELLANEOUS) ×3 IMPLANT
BAG SPEC THK2 15X12 ZIP CLS (MISCELLANEOUS) ×1
BAG ZIPLOCK 12X15 (MISCELLANEOUS) ×3 IMPLANT
BENZOIN TINCTURE PRP APPL 2/3 (GAUZE/BANDAGES/DRESSINGS) ×3 IMPLANT
BLADE SAW SAG 73X25 THK (BLADE) ×2
BLADE SAW SGTL 73X25 THK (BLADE) ×1 IMPLANT
CLOSURE STERI-STRIP 1/2X4 (GAUZE/BANDAGES/DRESSINGS) ×1
CLSR STERI-STRIP ANTIMIC 1/2X4 (GAUZE/BANDAGES/DRESSINGS) ×2 IMPLANT
COVER SURGICAL LIGHT HANDLE (MISCELLANEOUS) ×3 IMPLANT
COVER WAND RF STERILE (DRAPES) IMPLANT
CUP SECTOR GRIPTON 50MM (Cup) ×2 IMPLANT
DECANTER SPIKE VIAL GLASS SM (MISCELLANEOUS) ×9 IMPLANT
DRAPE 3/4 80X56 (DRAPES) ×3 IMPLANT
DRAPE INCISE IOBAN 66X45 STRL (DRAPES) ×3 IMPLANT
DRAPE ORTHO SPLIT 77X108 STRL (DRAPES) ×6
DRAPE POUCH INSTRU U-SHP 10X18 (DRAPES) ×3 IMPLANT
DRAPE SURG ORHT 6 SPLT 77X108 (DRAPES) ×2 IMPLANT
DRAPE U-SHAPE 47X51 STRL (DRAPES) ×3 IMPLANT
DRESSING AQUACEL AG SP 3.5X10 (GAUZE/BANDAGES/DRESSINGS) ×1 IMPLANT
DRSG AQUACEL AG ADV 3.5X10 (GAUZE/BANDAGES/DRESSINGS) ×2 IMPLANT
DRSG AQUACEL AG SP 3.5X10 (GAUZE/BANDAGES/DRESSINGS) ×3
DURAPREP 26ML APPLICATOR (WOUND CARE) ×3 IMPLANT
ELECT BLADE TIP CTD 4 INCH (ELECTRODE) ×3 IMPLANT
ELECT REM PT RETURN 15FT ADLT (MISCELLANEOUS) ×3 IMPLANT
FACESHIELD WRAPAROUND (MASK) ×9 IMPLANT
FACESHIELD WRAPAROUND OR TEAM (MASK) ×3 IMPLANT
FIBERTAPE 2 W/STRL NDL 17 (SUTURE) ×4 IMPLANT
GLOVE BIOGEL PI IND STRL 8 (GLOVE) ×2 IMPLANT
GLOVE BIOGEL PI INDICATOR 8 (GLOVE) ×4
GLOVE SURG ORTHO 8.0 STRL STRW (GLOVE) ×3 IMPLANT
GLOVE SURG SS PI 7.5 STRL IVOR (GLOVE) ×3 IMPLANT
GOWN STRL REUS W/TWL XL LVL3 (GOWN DISPOSABLE) ×6 IMPLANT
HEAD FEMORAL 32 CERAMIC (Hips) ×2 IMPLANT
HIP AML 10.5 STD 6.3 5/8SML6IN IMPLANT
HOLDER FOLEY CATH W/STRAP (MISCELLANEOUS) ×3 IMPLANT
HOOD PEEL AWAY FLYTE STAYCOOL (MISCELLANEOUS) ×3 IMPLANT
IMMOBILIZER KNEE 20 (SOFTGOODS) ×3
IMMOBILIZER KNEE 20 THIGH 36 (SOFTGOODS) ×1 IMPLANT
KIT BASIN OR (CUSTOM PROCEDURE TRAY) ×3 IMPLANT
KIT TURNOVER KIT A (KITS) IMPLANT
LINER PINN ALTRX ACE P4 HIP (Liner) ×2 IMPLANT
MANIFOLD NEPTUNE II (INSTRUMENTS) ×3 IMPLANT
NEEDLE HYPO 22GX1.5 SAFETY (NEEDLE) ×6 IMPLANT
NS IRRIG 1000ML POUR BTL (IV SOLUTION) ×3 IMPLANT
PACK TOTAL JOINT (CUSTOM PROCEDURE TRAY) ×3 IMPLANT
PENCIL SMOKE EVACUATOR (MISCELLANEOUS) IMPLANT
PROTECTOR NERVE ULNAR (MISCELLANEOUS) ×3 IMPLANT
STAPLER VISISTAT 35W (STAPLE) IMPLANT
SUCTION FRAZIER HANDLE 12FR (TUBING) ×3
SUCTION TUBE FRAZIER 12FR DISP (TUBING) ×1 IMPLANT
SUT ETHIBOND NAB CT1 #1 30IN (SUTURE) ×12 IMPLANT
SUT MNCRL AB 3-0 PS2 18 (SUTURE) ×3 IMPLANT
SUT VIC AB 0 CT1 36 (SUTURE) ×6 IMPLANT
SUT VIC AB 2-0 CT1 27 (SUTURE) ×6
SUT VIC AB 2-0 CT1 TAPERPNT 27 (SUTURE) ×2 IMPLANT
SYR CONTROL 10ML LL (SYRINGE) ×6 IMPLANT
TOWEL OR 17X26 10 PK STRL BLUE (TOWEL DISPOSABLE) ×3 IMPLANT
TRAY FOLEY MTR SLVR 16FR STAT (SET/KITS/TRAYS/PACK) ×3 IMPLANT
WATER STERILE IRR 1000ML POUR (IV SOLUTION) ×6 IMPLANT

## 2020-09-23 NOTE — Transfer of Care (Signed)
Immediate Anesthesia Transfer of Care Note  Patient: CAILLOU MINUS  Procedure(s) Performed: TOTAL HIP ARTHROPLASTY (Left Hip)  Patient Location: PACU  Anesthesia Type:General  Level of Consciousness: awake, alert , oriented and patient cooperative  Airway & Oxygen Therapy: Patient Spontanous Breathing and Patient connected to face mask  Post-op Assessment: Report given to RN and Post -op Vital signs reviewed and stable  Post vital signs: Reviewed and stable  Last Vitals:  Vitals Value Taken Time  BP 149/87 09/23/20 1253  Temp    Pulse 78 09/23/20 1255  Resp 13 09/23/20 1255  SpO2 100 % 09/23/20 1255  Vitals shown include unvalidated device data.  Last Pain:  Vitals:   09/23/20 0903  TempSrc: Oral  PainSc:       Patients Stated Pain Goal: 4 (09/23/20 0837)  Complications: No complications documented.

## 2020-09-23 NOTE — Anesthesia Postprocedure Evaluation (Signed)
Anesthesia Post Note  Patient: Eric Savage  Procedure(s) Performed: TOTAL HIP ARTHROPLASTY (Left Hip)     Patient location during evaluation: PACU Anesthesia Type: General Level of consciousness: awake Pain management: pain level controlled Vital Signs Assessment: post-procedure vital signs reviewed and stable Respiratory status: spontaneous breathing Cardiovascular status: stable Postop Assessment: able to ambulate Anesthetic complications: no   No complications documented.  Last Vitals:  Vitals:   09/23/20 1415 09/23/20 1439  BP: 127/84 (!) 147/79  Pulse: 70 66  Resp: 14 17  Temp:  36.5 C  SpO2: 100% 100%    Last Pain:  Vitals:   09/23/20 1446  TempSrc:   PainSc: 5                  Linnette Panella

## 2020-09-23 NOTE — Discharge Instructions (Signed)

## 2020-09-23 NOTE — Brief Op Note (Signed)
09/23/2020  12:52 PM  PATIENT:  [name]  54 y.o. male  PRE-OPERATIVE DIAGNOSIS:  OA LEFT HIP  POST-OPERATIVE DIAGNOSIS:  OA LEFT HIP  PROCEDURE:  Procedure(s): TOTAL HIP ARTHROPLASTY (Left)  SURGEON:  Surgeon(s) and Role:    Frederico Hamman, MD - Primary  PHYSICIAN ASSISTANT: Margart Sickles, PA-C  ASSISTANTS: OR staff x1   ANESTHESIA:   local and general  EBL:  400 mL   BLOOD ADMINISTERED:none  DRAINS: none   LOCAL MEDICATIONS USED:  MARCAINE     SPECIMEN:  No Specimen  DISPOSITION OF SPECIMEN:  N/A  COUNTS:  YES  TOURNIQUET:  * No tourniquets in log *  DICTATION: .Other Dictation: Dictation Number Unknown  PLAN OF CARE: Admit for overnight observation  PATIENT DISPOSITION:  PACU - hemodynamically stable.   Delay start of Pharmacological VTE agent (>24hrs) due to surgical blood loss or risk of bleeding: yes

## 2020-09-23 NOTE — Anesthesia Procedure Notes (Signed)
Procedure Name: Intubation Date/Time: 09/23/2020 10:38 AM Performed by: Vanessa Rantoul, CRNA Pre-anesthesia Checklist: Patient identified, Emergency Drugs available, Suction available and Patient being monitored Patient Re-evaluated:Patient Re-evaluated prior to induction Oxygen Delivery Method: Circle system utilized Preoxygenation: Pre-oxygenation with 100% oxygen Induction Type: IV induction Ventilation: Mask ventilation without difficulty Laryngoscope Size: 2 and Miller Grade View: Grade I Tube type: Oral Tube size: 7.5 mm Number of attempts: 1 Airway Equipment and Method: Stylet Placement Confirmation: ETT inserted through vocal cords under direct vision,  positive ETCO2 and breath sounds checked- equal and bilateral Secured at: 21 cm Tube secured with: Tape Dental Injury: Teeth and Oropharynx as per pre-operative assessment

## 2020-09-23 NOTE — Evaluation (Signed)
Physical Therapy Evaluation Patient Details Name: Eric Savage MRN: [id number] DOB: 27-Sep-1966 Today's Date: 09/23/2020   History of Present Illness  Patient is 54 y.o. male s/p Lt THA posterolateral approach on 09/23/20 with PMH significant for OA, HTN, CAD, anxiety.  Clinical Impression  Eric Savage is a 54 y.o. male POD 0 s/p Lt THA. Patient reports independence with mobility at baseline. Patient is now limited by functional impairments (see PT problem list below) and requires min assist for transfers and gait with RW. Patient was able to ambulate ~60 feet with RW and min assist. Patient educated on posterior hip precautions and instructed in exercise to facilitate circulation. Patient will benefit from continued skilled PT interventions to address impairments and progress towards PLOF. Acute PT will follow to progress mobility and stair training in preparation for safe discharge home.     Follow Up Recommendations Home health PT    Equipment Recommendations  None recommended by PT    Recommendations for Other Services       Precautions / Restrictions Precautions Precautions: Fall;Posterior Hip Precaution Booklet Issued: Yes (comment) Restrictions Weight Bearing Restrictions: No Other Position/Activity Restrictions: WBAT      Mobility  Bed Mobility Overal bed mobility: Needs Assistance Bed Mobility: Supine to Sit     Supine to sit: Min assist;HOB elevated     General bed mobility comments: cues for walking LE's off EOB and to use bed rail to scoot forward. assist for Lt LE and cues to maintain posterior hip precautions.    Transfers Overall transfer level: Needs assistance Equipment used: Rolling walker (2 wheeled) Transfers: Sit to/from Stand Sit to Stand: Min assist;From elevated surface         General transfer comment: VC's for technique with RW, assist for rise and cues to maintain posterior precautions  Ambulation/Gait Ambulation/Gait assistance:  Min assist Gait Distance (Feet): 60 Feet Assistive device: Rolling walker (2 wheeled) Gait Pattern/deviations: Step-to pattern;Decreased stride length;Decreased weight shift to left;Decreased stance time - left Gait velocity: decr   General Gait Details: Cues for step pattern and proximity to RW, no overt LOB. cues to maintain posterior precautions while turning and to avoid pivoting on Lt LE.  Stairs            Wheelchair Mobility    Modified Rankin (Stroke Patients Only)       Balance Overall balance assessment: Needs assistance Sitting-balance support: Feet supported Sitting balance-Leahy Scale: Good     Standing balance support: During functional activity;Bilateral upper extremity supported Standing balance-Leahy Scale: Fair                               Pertinent Vitals/Pain Pain Assessment: 0-10 Pain Score: 7  Pain Location: Lt hip Pain Descriptors / Indicators: Aching;Discomfort;Sore Pain Intervention(s): Limited activity within patient's tolerance;Monitored during session;Repositioned;Ice applied    Home Living Family/patient expects to be discharged to:: Private residence Living Arrangements: Spouse/significant other Available Help at Discharge: Family Type of Home: House Home Access: Stairs to enter Entrance Stairs-Rails: Left;Can reach both Secretary/administrator of Steps: 2 at front with Lt rail or 5 at back with bil rails Home Layout: One level Home Equipment: Walker - 2 wheels;Cane - single point;Bedside commode      Prior Function Level of Independence: Independent               Hand Dominance   Dominant Hand: Right    Extremity/Trunk Assessment  Upper Extremity Assessment Upper Extremity Assessment: Overall WFL for tasks assessed    Lower Extremity Assessment Lower Extremity Assessment: LLE deficits/detail;Overall WFL for tasks assessed LLE: Unable to fully assess due to immobilization LLE Sensation: WNL LLE  Coordination: WNL    Cervical / Trunk Assessment Cervical / Trunk Assessment: Normal  Communication   Communication: No difficulties  Cognition Arousal/Alertness: Awake/alert Behavior During Therapy: WFL for tasks assessed/performed Overall Cognitive Status: Within Functional Limits for tasks assessed                                        General Comments      Exercises Total Joint Exercises Ankle Circles/Pumps: AROM;Both;20 reps;Seated   Assessment/Plan    PT Assessment Patient needs continued PT services  PT Problem List Decreased strength;Decreased range of motion;Decreased activity tolerance;Decreased balance;Decreased mobility;Decreased knowledge of use of DME;Decreased knowledge of precautions;Pain       PT Treatment Interventions DME instruction;Gait training;Stair training;Functional mobility training;Therapeutic activities;Therapeutic exercise;Balance training;Patient/family education    PT Goals (Current goals can be found in the Care Plan section)  Acute Rehab PT Goals Patient Stated Goal: regain independence and stop hurting PT Goal Formulation: With patient Time For Goal Achievement: 09/30/20 Potential to Achieve Goals: Good    Frequency 7X/week   Barriers to discharge        Co-evaluation               AM-PAC PT "6 Clicks" Mobility  Outcome Measure Help needed turning from your back to your side while in a flat bed without using bedrails?: A Little Help needed moving from lying on your back to sitting on the side of a flat bed without using bedrails?: A Little Help needed moving to and from a bed to a chair (including a wheelchair)?: A Little Help needed standing up from a chair using your arms (e.g., wheelchair or bedside chair)?: A Little Help needed to walk in hospital room?: A Little Help needed climbing 3-5 steps with a railing? : A Little 6 Click Score: 18    End of Session Equipment Utilized During Treatment: Gait  belt;Left knee immobilizer Activity Tolerance: Patient tolerated treatment well Patient left: in chair;with call bell/phone within reach;with chair alarm set;with family/visitor present;with nursing/sitter in room Nurse Communication: Mobility status PT Visit Diagnosis: Muscle weakness (generalized) (M62.81);Difficulty in walking, not elsewhere classified (R26.2);Pain Pain - Right/Left: Left Pain - part of body: Hip    Time: 8546-2703 PT Time Calculation (min) (ACUTE ONLY): 32 min   Charges:   PT Evaluation $PT Eval Low Complexity: 1 Low PT Treatments $Gait Training: 8-22 mins        Wynn Maudlin, DPT Acute Rehabilitation Services Office [phone] Pager [phone]    Anitra Lauth 09/23/2020, 4:23 PM

## 2020-09-23 NOTE — Interval H&P Note (Signed)
History and Physical Interval Note:  09/23/2020 9:17 AM  Eric Savage  has presented today for surgery, with the diagnosis of OA LEFT HIP.  The various methods of treatment have been discussed with the patient and family. After consideration of risks, benefits and other options for treatment, the patient has consented to  Procedure(s): TOTAL HIP ARTHROPLASTY (Left) as a surgical intervention.  The patient's history has been reviewed, patient examined, no change in status, stable for surgery.  I have reviewed the patient's chart and labs.  Questions were answered to the patient's satisfaction.     Thera Flake

## 2020-09-24 ENCOUNTER — Ambulatory Visit (HOSPITAL_COMMUNITY): Payer: 59

## 2020-09-24 DIAGNOSIS — M1612 Unilateral primary osteoarthritis, left hip: Secondary | ICD-10-CM | POA: Diagnosis not present

## 2020-09-24 NOTE — Plan of Care (Signed)
Plan of care reviewed and discussed with the patient. 

## 2020-09-24 NOTE — Progress Notes (Signed)
Physical Therapy Treatment Patient Details Name: Eric Savage MRN: [id number] DOB: [birth year] Today's Date: 09/24/2020    History of Present Illness Patient is 54 y.o. male s/p Lt THA posterolateral approach on 09/23/20 with PMH significant for OA, HTN, CAD, anxiety.    PT Comments    Pt ambulatory in hallway today with RW, and proficiently navigated steps with min cuing for form and safety. PT reinforced posterior hip precautions throughout session, pt demonstrating good understanding and application of hip precautions. PT administered, reviewed, and practiced THA HEP with pt, pt with no further questions and will have adequate assist from wife at d/c. All PT education completed, pt appropriate to d/c home.     Follow Up Recommendations  Home health PT     Equipment Recommendations  None recommended by PT    Recommendations for Other Services       Precautions / Restrictions Precautions Precautions: Fall;Posterior Hip Precaution Booklet Issued: Yes (comment) Precaution Comments: handout administered, reviewed, and practiced during mobility Restrictions Weight Bearing Restrictions: No Other Position/Activity Restrictions: WBAT    Mobility  Bed Mobility Overal bed mobility: Needs Assistance Bed Mobility: Supine to Sit;Sit to Supine     Supine to sit: Min assist Sit to supine: Min assist   General bed mobility comments: min assist for lifting LLE into and out of bed, pt's wife present and able to assist pt at home. PT reinforcing posterior hip precautions throughout bed mobility  Transfers Overall transfer level: Needs assistance Equipment used: Rolling walker (2 wheeled) Transfers: Sit to/from Stand Sit to Stand: Supervision         General transfer comment: for safety, VC for hand placement when rising and sitting, keeping chest upright and avoid leaning over L hip to maintain precautions  Ambulation/Gait Ambulation/Gait assistance: Supervision Gait  Distance (Feet): 140 Feet (x2 - to and from rehab gym) Assistive device: Rolling walker (2 wheeled) Gait Pattern/deviations: Step-through pattern;Decreased stride length;Trunk flexed Gait velocity: decr   General Gait Details: Supervision for safety, verbal cuing for upright posture, turning towards R when possible to maintain hip precautions, Outtoeing LLE when turning L to maintain hip precautions.   Stairs Stairs: Yes   Stair Management: One rail Left;Step to pattern;Sideways;Forwards Number of Stairs: 3 General stair comments: min guard for safety, verbal cuing for sequencing (up with good leg, down with bad leg leading)   Wheelchair Mobility    Modified Rankin (Stroke Patients Only)       Balance Overall balance assessment: Needs assistance Sitting-balance support: Feet supported Sitting balance-Leahy Scale: Good     Standing balance support: During functional activity;Bilateral upper extremity supported Standing balance-Leahy Scale: Fair                              Cognition Arousal/Alertness: Awake/alert Behavior During Therapy: WFL for tasks assessed/performed Overall Cognitive Status: Within Functional Limits for tasks assessed                                 General Comments: follows precautions during mobility well, with min PT cues      Exercises Total Joint Exercises Ankle Circles/Pumps: AROM;Both;10 reps;Supine Quad Sets: AROM;Left;5 reps;Supine Short Arc Quad: AAROM;Left;5 reps;Supine Heel Slides: AAROM;Left;5 reps;Supine Hip ABduction/ADduction: AAROM;Left;5 reps;Supine Long Arc Quad: AAROM;Left;5 reps;Seated    General Comments        Pertinent Vitals/Pain Pain Assessment: 0-10 Pain Score:  4  Pain Location: Lt hip Pain Descriptors / Indicators: Aching;Discomfort;Sore Pain Intervention(s): Monitored during session;Repositioned;Limited activity within patient's tolerance    Home Living                       Prior Function            PT Goals (current goals can now be found in the care plan section) Acute Rehab PT Goals Patient Stated Goal: regain independence and stop hurting PT Goal Formulation: With patient Time For Goal Achievement: 09/30/20 Potential to Achieve Goals: Good Progress towards PT goals: Progressing toward goals    Frequency    7X/week      PT Plan Current plan remains appropriate    Co-evaluation              AM-PAC PT "6 Clicks" Mobility   Outcome Measure  Help needed turning from your back to your side while in a flat bed without using bedrails?: A Little Help needed moving from lying on your back to sitting on the side of a flat bed without using bedrails?: A Little Help needed moving to and from a bed to a chair (including a wheelchair)?: A Little Help needed standing up from a chair using your arms (e.g., wheelchair or bedside chair)?: A Little Help needed to walk in hospital room?: A Little Help needed climbing 3-5 steps with a railing? : A Little 6 Click Score: 18    End of Session   Activity Tolerance: Patient tolerated treatment well Patient left: with call bell/phone within reach;with family/visitor present;in bed Nurse Communication: Mobility status PT Visit Diagnosis: Muscle weakness (generalized) (M62.81);Difficulty in walking, not elsewhere classified (R26.2);Pain Pain - Right/Left: Left Pain - part of body: Hip     Time: 1308-6578 PT Time Calculation (min) (ACUTE ONLY): 40 min  Charges:  $Gait Training: 23-37 mins $Therapeutic Exercise: 8-22 mins                     Marye Round, PT Acute Rehabilitation Services Pager [phone]  Office [phone]    Knight Oelkers E Christain [place] 09/24/2020, 10:40 AM

## 2020-09-24 NOTE — Progress Notes (Signed)
Rec'd VORB okay to d/c to home from [facility]. Patient discharged to home w/ SO. Given all belongings, instructions, equipment. Verbalized understanding of all instructions. Escorted to pov via w.c

## 2020-09-24 NOTE — Plan of Care (Signed)
  Problem: Activity: Goal: Risk for activity intolerance will decrease Outcome: Progressing   Problem: Pain Managment: Goal: General experience of comfort will improve Outcome: Progressing   Problem: Activity: Goal: Ability to avoid complications of mobility impairment will improve Outcome: Progressing   

## 2020-09-24 NOTE — TOC Initial Note (Signed)
Transition of Care [facility]) - Initial/Assessment Note    Patient Details  Name: Eric Savage MRN: [id number] Date of Birth: 1966/07/04  Transition of Care (TOC) CM/SW Contact:    Armanda Heritage, RN Phone Number: 09/24/2020, 2:35 PM  Clinical Narrative:                 CM spoke with patient who reports he has rolling walker and 3in1 and expects to be discharged home with home exercise program.   Expected Discharge Plan: Home/Self Care Barriers to Discharge: No Barriers Identified   Patient Goals and CMS Choice Patient states their goals for this hospitalization and ongoing recovery are:: to go home with exercises      Expected Discharge Plan and Services Expected Discharge Plan: Home/Self Care   Discharge Planning Services: CM Consult   Living arrangements for the past 2 months: Single Family Home Expected Discharge Date: 09/24/20               DME Arranged: N/A DME Agency: NA                  Prior Living Arrangements/Services Living arrangements for the past 2 months: Single Family Home   Patient language and need for interpreter reviewed:: Yes Do you feel safe going back to the place where you live?: Yes      Need for Family Participation in Patient Care: Yes (Comment) Care giver support system in place?: Yes (comment)   Criminal Activity/Legal Involvement Pertinent to Current Situation/Hospitalization: No - Comment as needed  Activities of Daily Living Home Assistive Devices/Equipment: Eyeglasses,Walker (specify type),Bedside commode/3-in-1 ADL Screening (condition at time of admission) Patient's cognitive ability adequate to safely complete daily activities?: Yes Is the patient deaf or have difficulty hearing?: No Does the patient have difficulty seeing, even when wearing glasses/contacts?: No Does the patient have difficulty concentrating, remembering, or making decisions?: No Patient able to express need for assistance with ADLs?: Yes Does the  patient have difficulty dressing or bathing?: No Independently performs ADLs?: Yes (appropriate for developmental age) Does the patient have difficulty walking or climbing stairs?: No Weakness of Legs: Left Weakness of Arms/Hands: None  Permission Sought/Granted                  Emotional Assessment Appearance:: Appears stated age Attitude/Demeanor/Rapport: Engaged Affect (typically observed): Accepting Orientation: : Oriented to Self,Oriented to Place,Oriented to  Time,Oriented to Situation   Psych Involvement: No (comment)  Admission diagnosis:  Degenerative joint disease of left hip [M16.12] Patient Active Problem List   Diagnosis Date Noted  . Degenerative joint disease of left hip 09/23/2020  . PAF (paroxysmal atrial fibrillation) (HCC); CHA2DS2-VASc Score 2 08/24/2019  . S/P colectomy 06/05/2017  . Preoperative cardiovascular examination 12/06/2016  . Diverticulitis large intestine w/o perforation or abscess w/o bleeding   . Diverticulitis 09/08/2016  . Obesity (BMI 30-39.9) 04/26/2014  . Tobacco abuse counseling 04/12/2013  . Non-compliance with treatment 01/28/2013  . Essential hypertension 05/16/2012  . Dyslipidemia, goal LDL below 70 05/16/2012  . Smoker 05/16/2012  . CAD, RCA/CFX DES 12/10 with residual 50-60% LAD.- Cath 05/19/12- and 01/28/13 no ISR- med Rx 09/15/2009   PCP:  Marva Panda, NP Pharmacy:   [id number] - [address], [place] - [address] [place] [address] Phone: [phone] Fax: [phone]     Social Determinants of Health (SDOH) Interventions    Readmission Risk Interventions No flowsheet data found.

## 2020-09-24 NOTE — Op Note (Signed)
NAME: Eric Savage, Eric Savage. MEDICAL RECORD PQ:[id number] ACCOUNT [id number] DATE OF BIRTH:07-24-1966 FACILITY: [facility] LOCATION: [facility] PHYSICIAN:W. Evangelyne Loja JR., MD  OPERATIVE REPORT  DATE OF PROCEDURE:  09/23/2020  PREOPERATIVE DIAGNOSIS:  Severe osteoarthritis, left hip.  POSTOPERATIVE DIAGNOSES:  Severe osteoarthritis, left hip.  OPERATION:  Left total hip replacement (AML size 10.5 small statured stem with a +1, 32 mm ceramic hip ball, 50 mm Pinnacle Gription acetabular cup with AltrX polyethylene acetabular liner +4 mm 10 degrees).  SURGEON:  Marcie Mowers, MD  ASSISTANTVincent Peyer.  ANESTHESIA:  General with local.  ESTIMATED BLOOD LOSS:  400 mL.  DESCRIPTION OF PROCEDURE:  Lateral positioning with posterior approach to the hip made, splitting the iliotibial band and gluteus maximus fascia.  Capsulotomy was done.  Hip was dislocated.  Severe arthritis was confirmed.  We cut the head about one  fingerbreadth above the lesser trochanter.  Progressively reamed, but noted that the patient was small statured to accept a 10.5 mm broach.  We were careful to lateralize the prosthesis as well as careful reaming, had no undue problems on the canal,  although the canal there was some mild mismatch proximally, but we did get the 10.5 small stature down on the exiting with the broach on one occasion, a small avulsion of the portion of the greater trochanter was noted.  This did not extend into the main  body, but we did fix it with some through and through sutures with drill holes with an Ar[employer]rex [employer].  This did not jeopardize any of the stability of the prosthesis.  Acetabular wing retractors were placed as well as an anterior retractor to retract the femur and inferior retractor.  We resected the labrum as well as the soft tissues.  We reamed the acetabulum in approximately 45-50 degrees of abduction, 10-15 degrees  anteversion, acetabulum appeared mildly retroverted, which may  have contributed to the patient's problem.  We then placed the final prosthesis with a 1 mm under ream, followed by a trial cup with liner noted prominently at about the 3 o'clock position  for the left hip.  We then trialled and 1+1 was deemed to be acceptable.  We then replaced the trial liner with the final liner, placed the prosthesis without difficulty.  Stability wise, the patient could be flexed maximally, adducted maximally and  could be internally rotated approaching 90 degrees before there was any tendency for dislocate.  Leg lengths appeared approximately equal.  Wound was irrigated.  The gluteus maximus iliotibial band was closed with Ethibond, subcutaneous tissue with 2-0  Vicryl, skin with Monocryl.  Taken to recovery room in stable condition.  HN/NUANCE  D:09/23/2020 T:09/24/2020 JOB:[id number]

## 2020-09-24 NOTE — Progress Notes (Signed)
Patient reports while sitting to side of bed, he bent over at waist to adjust his shoe and felt a severe sudden pain to left hip. Examined patient, no apparent sign of dislocation. Patient able to stand with pain. No limb shortening or internal rotation. Patient ambulated in halls 79ft w/ walker. Reports pain as severe but able to bear significant weight. Notified PA of pain and situation. PA to round and assess patient.

## 2020-09-24 NOTE — Progress Notes (Addendum)
     Subjective: 1 Day Post-Op s/p Procedure(s): TOTAL HIP ARTHROPLASTY  Patient seating in wheelchair. States pain is severe in left buttock. States that PT went well today, however he was going to put his croc on and his wife accidentally jerked the heel of his foot causing it to internally rotate and severe pain in his left buttock. After this incident he was able to ambulate in the hall 75 feet.  Objective:  PE: VITALS:   Vitals:   09/23/20 2218 09/24/20 0125 09/24/20 0544 09/24/20 1009  BP: (!) 143/71 118/67 114/70 122/68  Pulse: 79 65 64 67  Resp: 16 16 16 16   Temp: 98.5 F (36.9 C) 98.1 F (36.7 C) 97.8 F (36.6 C) 98.2 F (36.8 C)  TempSrc: Oral Oral Oral Oral  SpO2: 99% 98% 96% 98%  Weight:      Height:       General: alert, oriented, sitting in wheelchair MSK: LLE - dressing removed and incision is intact and still well approximated with no drainage. Dorsiflexion and plantarflexion intact. Distal sensation is intact. Foot warm and well perfused.   LABS  No results found for this or any previous visit (from the past 24 hour(s)).  DG Pelvis Portable  Result Date: 09/23/2020 CLINICAL DATA:  Left hip replacement. EXAM: PORTABLE PELVIS 1-2 VIEWS COMPARISON:  08/01/2020 FINDINGS: Interval left hip replacement in satisfactory position alignment. There is a transverse fracture of the superior aspect of the greater trochanter not present previously. No other fracture. Gas in the surrounding soft tissues. IMPRESSION: Left hip replacement. Transverse fracture proximal greater trochanter. Electronically Signed   By: 08/03/2020 M.D.   On: 09/23/2020 13:46   DG Hip Port Unilat With Pelvis 1V Left  Result Date: 09/23/2020 CLINICAL DATA:  Left hip replacement anterior approach EXAM: DG HIP (WITH OR WITHOUT PELVIS) 1V PORT LEFT COMPARISON:  08/01/2020 FINDINGS: Left hip replacement in satisfactory position alignment. Transverse nondisplaced fracture proximal greater trochanter  on the left. Gas in the surrounding soft tissues. IMPRESSION: Left hip replacement. Transverse nondisplaced fracture proximal greater trochanter. The Electronically Signed   By: 08/03/2020 M.D.   On: 09/23/2020 13:47    Assessment/Plan: Active Problems:   Degenerative joint disease of left hip  1 Day Post-Op s/p Procedure(s): TOTAL HIP ARTHROPLASTY - patient had jerking internal rotation event when attempting to get dressed for discharge - able to ambulate 75 feet with RW afterwards - x-rays show no displacement or changes of arthroplasty, greater trochanter fracture seen on original post-op x-rays and noted by Dr. 09/25/2020 in his op note - WBAT - continue aspirin for DVT prophylaxis - will be discharge with 5mg  oxycodone which he can take 2 q 4 hours as neede - Follow up with Dr. Madelon Lips in 2 weeks - hip appears stable with no signs of dislocation - ok for discharge   Contact information:   Weekdays 8-5 Madelon Lips, PA-C [phone] A fter hours and holidays please check [email or web address] for group call information for [facility]  Janine Ores 09/24/2020, 2:01 PM

## 2020-09-26 ENCOUNTER — Encounter (HOSPITAL_COMMUNITY): Payer: Self-pay | Admitting: Orthopedic Surgery

## 2020-10-12 ENCOUNTER — Other Ambulatory Visit: Payer: Self-pay | Admitting: Cardiology

## 2020-10-24 ENCOUNTER — Encounter (HOSPITAL_COMMUNITY): Payer: Self-pay | Admitting: *Deleted

## 2020-10-24 ENCOUNTER — Other Ambulatory Visit (HOSPITAL_COMMUNITY): Payer: Self-pay | Admitting: Nurse Practitioner

## 2020-11-06 ENCOUNTER — Other Ambulatory Visit: Payer: Self-pay | Admitting: Cardiology

## 2020-11-30 ENCOUNTER — Other Ambulatory Visit: Payer: Self-pay | Admitting: Cardiology

## 2020-12-14 ENCOUNTER — Other Ambulatory Visit: Payer: Self-pay | Admitting: Cardiology

## 2021-01-18 ENCOUNTER — Other Ambulatory Visit: Payer: Self-pay | Admitting: Cardiology

## 2021-02-07 ENCOUNTER — Other Ambulatory Visit: Payer: Self-pay | Admitting: Cardiology

## 2021-02-07 ENCOUNTER — Other Ambulatory Visit (HOSPITAL_COMMUNITY): Payer: Self-pay | Admitting: Nurse Practitioner

## 2021-02-07 NOTE — Telephone Encounter (Signed)
Rx(s) sent to pharmacy electronically.  

## 2021-02-21 ENCOUNTER — Other Ambulatory Visit (HOSPITAL_COMMUNITY): Payer: Self-pay | Admitting: Nurse Practitioner

## 2021-05-16 ENCOUNTER — Other Ambulatory Visit: Payer: Self-pay | Admitting: Cardiology

## 2021-05-16 ENCOUNTER — Other Ambulatory Visit (HOSPITAL_COMMUNITY): Payer: Self-pay | Admitting: Nurse Practitioner

## 2021-06-25 ENCOUNTER — Other Ambulatory Visit: Payer: Self-pay | Admitting: Cardiology

## 2021-07-18 ENCOUNTER — Other Ambulatory Visit: Payer: Self-pay | Admitting: Cardiology

## 2021-08-08 ENCOUNTER — Encounter (HOSPITAL_COMMUNITY): Payer: Self-pay | Admitting: Nurse Practitioner

## 2021-08-08 ENCOUNTER — Other Ambulatory Visit: Payer: Self-pay

## 2021-08-08 ENCOUNTER — Ambulatory Visit (HOSPITAL_COMMUNITY)
Admission: RE | Admit: 2021-08-08 | Discharge: 2021-08-08 | Disposition: A | Discharge status: Home / Self Care | Payer: 59 | Source: Ambulatory Visit | Attending: Nurse Practitioner | Admitting: Nurse Practitioner

## 2021-08-08 VITALS — BP 176/88 | HR 52 | Ht 65.0 in | Wt 179.8 lb

## 2021-08-08 DIAGNOSIS — Z955 Presence of coronary angioplasty implant and graft: Secondary | ICD-10-CM | POA: Insufficient documentation

## 2021-08-08 DIAGNOSIS — Z886 Allergy status to analgesic agent status: Secondary | ICD-10-CM | POA: Insufficient documentation

## 2021-08-08 DIAGNOSIS — F1090 Alcohol use, unspecified, uncomplicated: Secondary | ICD-10-CM | POA: Insufficient documentation

## 2021-08-08 DIAGNOSIS — Z79899 Other long term (current) drug therapy: Secondary | ICD-10-CM | POA: Diagnosis not present

## 2021-08-08 DIAGNOSIS — R0683 Snoring: Secondary | ICD-10-CM | POA: Diagnosis not present

## 2021-08-08 DIAGNOSIS — Z9114 Patient's other noncompliance with medication regimen: Secondary | ICD-10-CM | POA: Insufficient documentation

## 2021-08-08 DIAGNOSIS — G4730 Sleep apnea, unspecified: Secondary | ICD-10-CM | POA: Insufficient documentation

## 2021-08-08 DIAGNOSIS — D6869 Other thrombophilia: Secondary | ICD-10-CM

## 2021-08-08 DIAGNOSIS — I480 Paroxysmal atrial fibrillation: Secondary | ICD-10-CM

## 2021-08-08 DIAGNOSIS — I10 Essential (primary) hypertension: Secondary | ICD-10-CM | POA: Insufficient documentation

## 2021-08-08 DIAGNOSIS — R5383 Other fatigue: Secondary | ICD-10-CM | POA: Diagnosis not present

## 2021-08-08 DIAGNOSIS — I2510 Atherosclerotic heart disease of native coronary artery without angina pectoris: Secondary | ICD-10-CM | POA: Insufficient documentation

## 2021-08-08 DIAGNOSIS — R001 Bradycardia, unspecified: Secondary | ICD-10-CM | POA: Insufficient documentation

## 2021-08-08 DIAGNOSIS — Z8249 Family history of ischemic heart disease and other diseases of the circulatory system: Secondary | ICD-10-CM | POA: Diagnosis not present

## 2021-08-08 DIAGNOSIS — F17210 Nicotine dependence, cigarettes, uncomplicated: Secondary | ICD-10-CM | POA: Insufficient documentation

## 2021-08-08 DIAGNOSIS — Z7902 Long term (current) use of antithrombotics/antiplatelets: Secondary | ICD-10-CM | POA: Diagnosis not present

## 2021-08-08 DIAGNOSIS — Z96642 Presence of left artificial hip joint: Secondary | ICD-10-CM | POA: Diagnosis not present

## 2021-08-08 DIAGNOSIS — Z7982 Long term (current) use of aspirin: Secondary | ICD-10-CM | POA: Insufficient documentation

## 2021-08-08 DIAGNOSIS — R079 Chest pain, unspecified: Secondary | ICD-10-CM | POA: Insufficient documentation

## 2021-08-08 LAB — BASIC METABOLIC PANEL
Anion gap: 5 (ref 5–15)
BUN: 13 mg/dL (ref 6–20)
CO2: 27 mmol/L (ref 22–32)
Calcium: 9.1 mg/dL (ref 8.9–10.3)
Chloride: 109 mmol/L (ref 98–111)
Creatinine, Ser: 1.03 mg/dL (ref 0.61–1.24)
GFR, Estimated: >60 mL/min (ref >60)
Glucose, Bld: 100 mg/dL — ABNORMAL HIGH (ref 70–99)
Potassium: 4.3 mmol/L (ref 3.5–5.1)
Sodium: 141 mmol/L (ref 135–145)

## 2021-08-08 LAB — CBC
HCT: 45 % (ref 39.0–52.0)
Hemoglobin: 14.8 g/dL (ref 13.0–17.0)
MCH: 31.2 pg (ref 26.0–34.0)
MCHC: 32.9 g/dL (ref 30.0–36.0)
MCV: 94.7 fL (ref 80.0–100.0)
Platelets: 202 10*3/uL (ref 150–400)
RBC: 4.75 MIL/uL (ref 4.22–5.81)
RDW: 12 % (ref 11.5–15.5)
WBC: 8.6 10*3/uL (ref 4.0–10.5)
nRBC: 0 % (ref 0.0–0.2)
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
## 2021-08-17 ENCOUNTER — Other Ambulatory Visit: Payer: Self-pay | Admitting: Cardiology

## 2021-10-08 ENCOUNTER — Other Ambulatory Visit (HOSPITAL_COMMUNITY): Payer: Self-pay | Admitting: Nurse Practitioner

## 2021-10-18 ENCOUNTER — Telehealth: Payer: Self-pay | Admitting: Cardiology

## 2021-10-18 NOTE — Telephone Encounter (Signed)
No issues with simethicone and his current meds.

## 2021-10-18 NOTE — Telephone Encounter (Signed)
Spoke with patient who reports having diarrhea very watery three times already today. He said he ate at a [place] buffet yesterday. Denies fever, sweating. Has nausea, no vomiting. Took 3 simethicone 125 mg tablets today and is concerned it may interfere with cardiac meds. Recommended he call his PCP. He said they're not answering the phone. Recommended he go to an urgent care to be checked. Explained that if he does have bacteria, it needs to leave his body, to stop taking simethicone and be checked at an urgent care or ER. He stated "okay."

## 2021-10-18 NOTE — Telephone Encounter (Signed)
pt had diahrrea and took (2) simethicone 125mg  without consulting cardiology and wanted to make sure that this is going to be ok

## 2021-10-18 NOTE — Telephone Encounter (Signed)
Called to inform patient of PharmD response of No issues with simethicone and his current meds, but there was no answer.

## 2021-12-20 ENCOUNTER — Ambulatory Visit: Payer: 59 | Admitting: Neurology

## 2021-12-21 ENCOUNTER — Encounter: Payer: Self-pay | Admitting: Neurology

## 2022-02-24 ENCOUNTER — Other Ambulatory Visit: Payer: Self-pay | Admitting: Cardiology

## 2022-03-02 ENCOUNTER — Other Ambulatory Visit: Payer: Self-pay | Admitting: Cardiology

## 2022-04-20 IMAGING — CT CT 3D INDEPENDENT WKST
4 of 6 series · 16 of 36 positions shown, 18 images · non-contrast
Comparison: None.

CLINICAL DATA: No known injury, left elbow pain with repetitive
work. Limited range of motion. Nonspecific (abnormal) findings on
radiological and other examination of musculoskeletal system.

EXAM:
CT OF THE UPPER LEFT EXTREMITY WITHOUT CONTRAST
CT - 3-DIMENSIONAL CT IMAGE RENDERING ON INDEPENDENT WORKSTATION
TECHNIQUE: Multidetector CT imaging of the upper left extremity was performed
according to the standard protocol.

[Series 3: elbow 1.50 br60 s3 axial bone hd fov · axial · 0.29mm/px · z∈[-899,-816]mm · 5 of 156 slices shown, 7 images]
[im 26/156  soft-tissue]
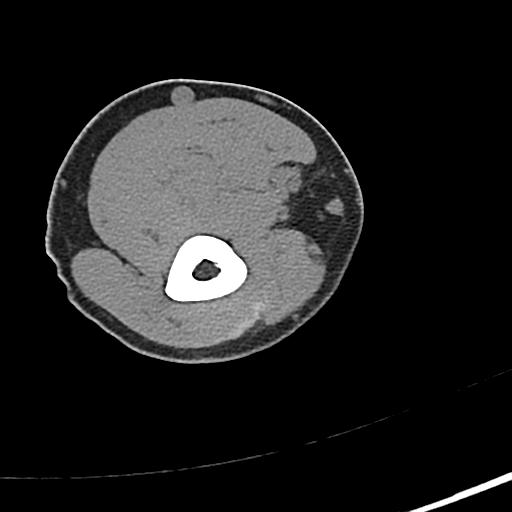
[im 26/156  bone]
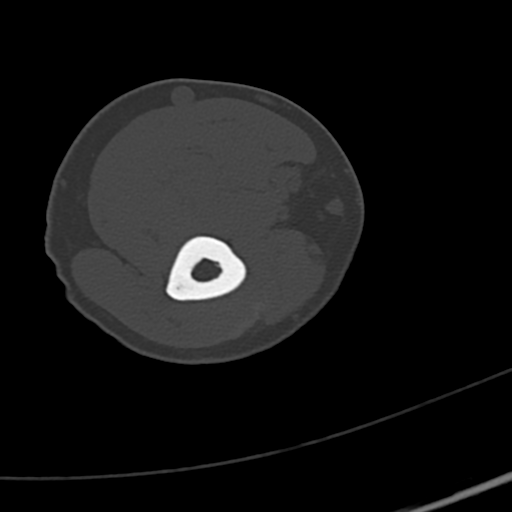
[im 52/156  bone]
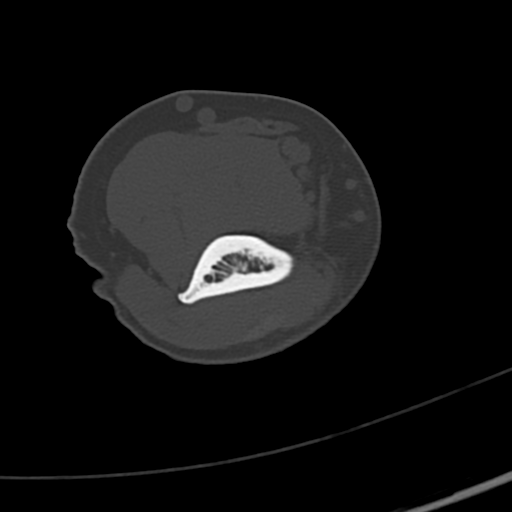
[im 78/156  bone]
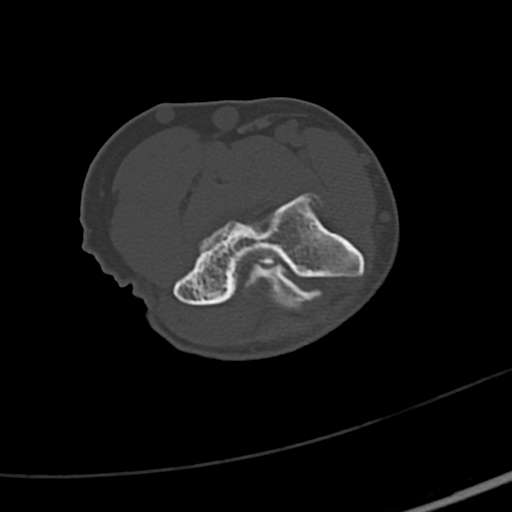
[im 104/156  bone]
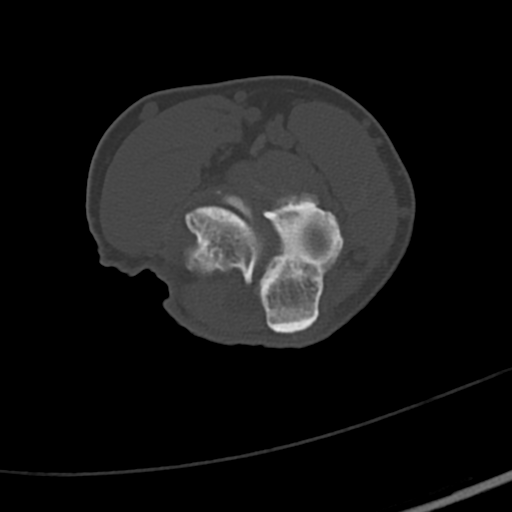
[im 130/156  soft-tissue]
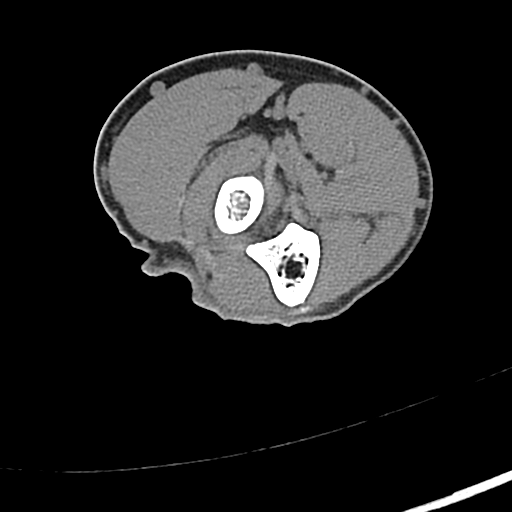
[im 130/156  bone]
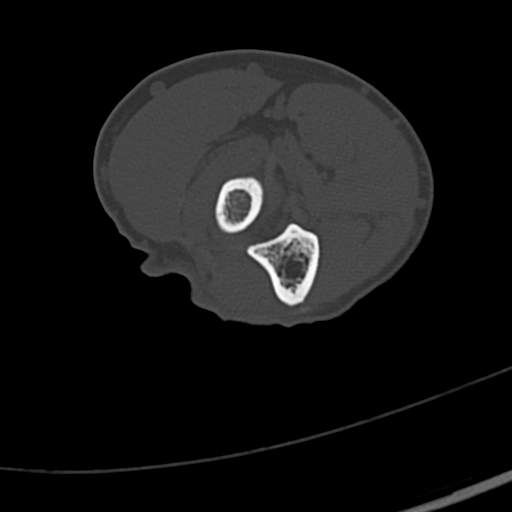

[Series 5: elbow 1.50 br40 s3 axial st hd fov · axial · 0.29mm/px · z∈[-899,-837]mm · 4 of 156 slices shown]
[im 26/156  bone]
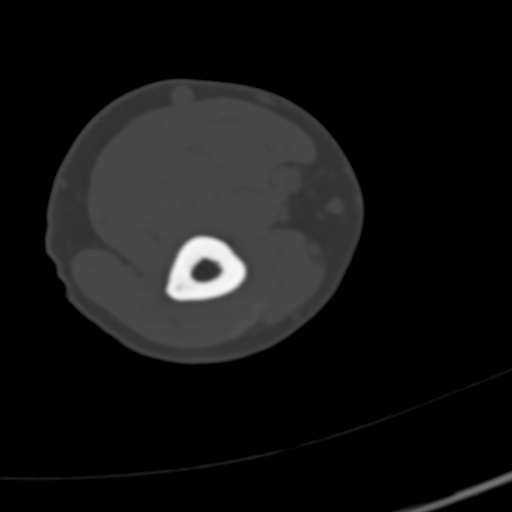
[im 52/156  bone]
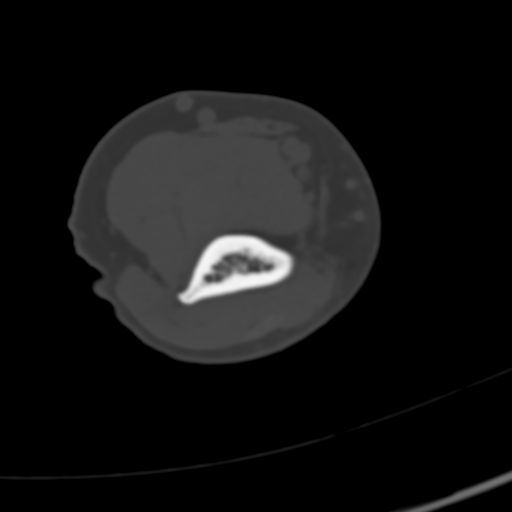
[im 78/156  bone]
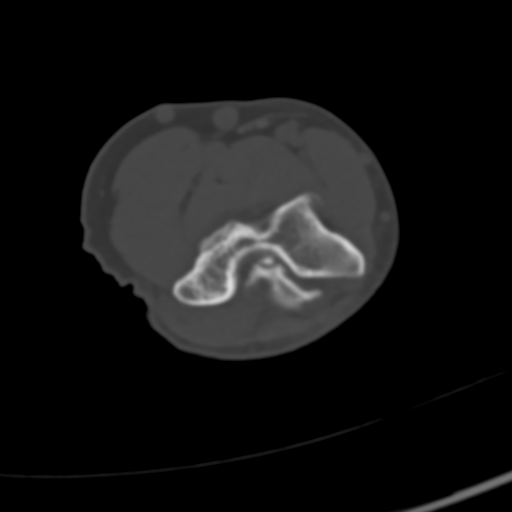
[im 104/156  bone]
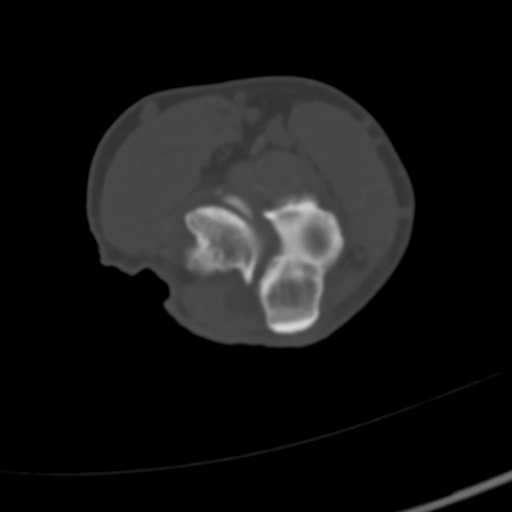

[Series 9: elbow 1.50 hr60 s3 cor bone hd fov · coronal · 0.25mm/px · 1 of 169 slices shown]
[im 85/169  bone]
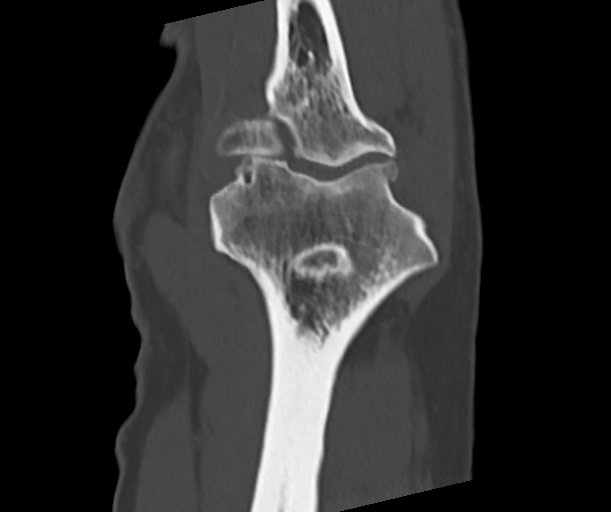

[Series 13: elbow 1.50 br60 s3 sag bone hd fov · sagittal · 0.25mm/px · 6 of 186 slices shown]
[im 31/186  bone]
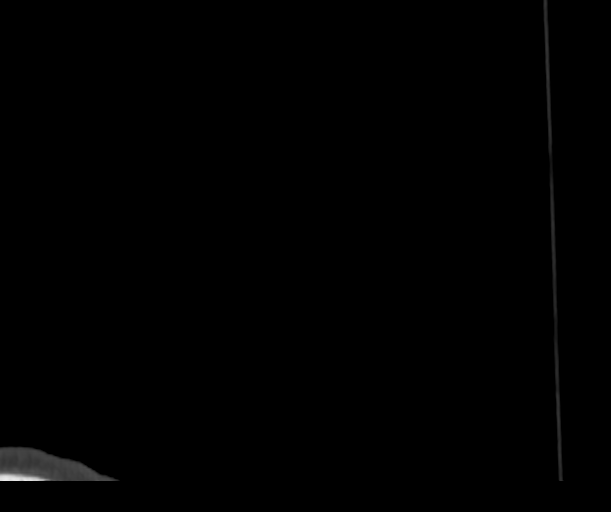
[im 59/186  soft-tissue]
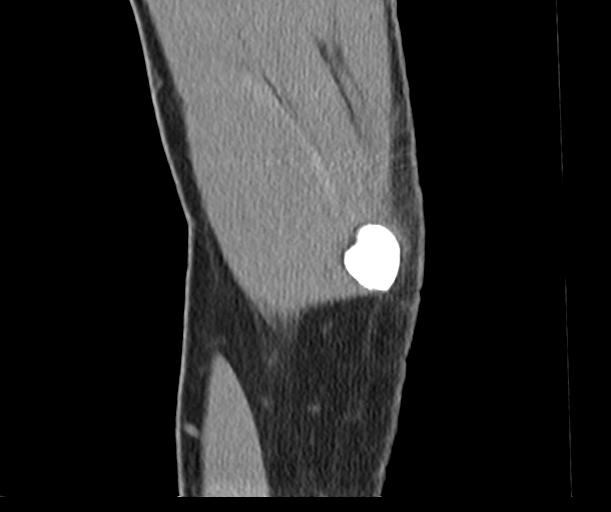
[im 62/186  bone]
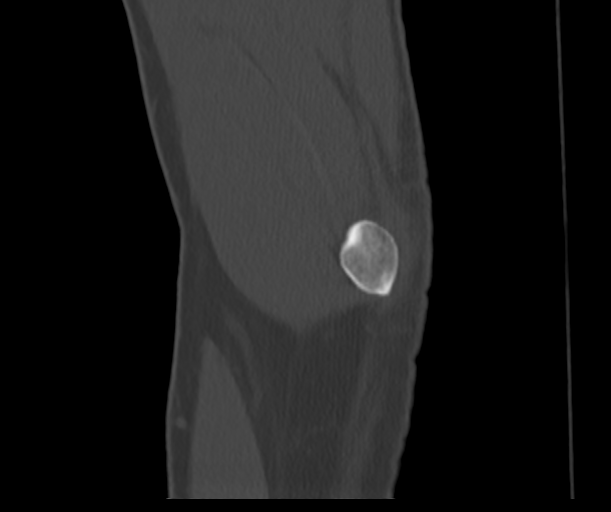
[im 93/186  bone]
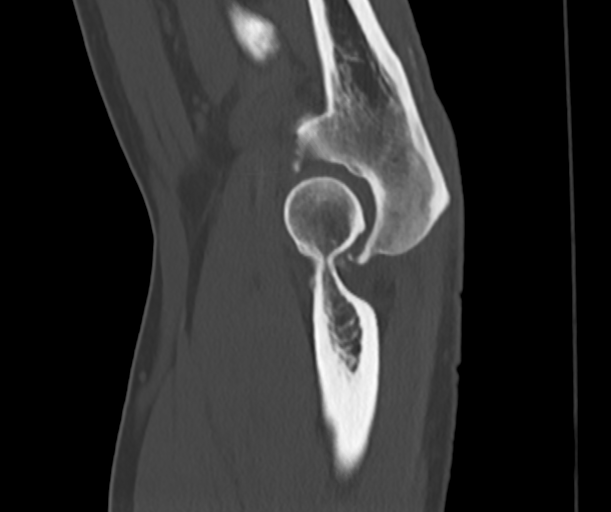
[im 124/186  bone]
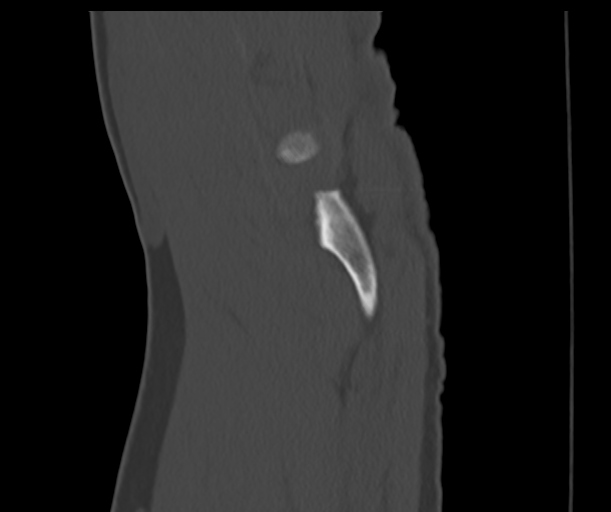
[im 155/186  bone]
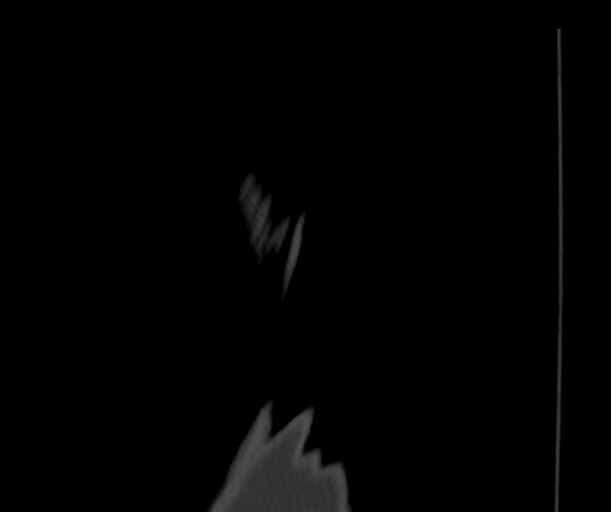

[16 of 36 positions shown; findings below may reference images not displayed]

FINDINGS: Bones/Joint/Cartilage

No fracture or dislocation. Normal alignment. Small joint effusion.

Moderate-severe joint space narrowing and marginal osteophytes of
the radiocapitellar joint. Moderate ulnohumeral joint space
narrowing with small marginal osteophytes.

Ligaments

Ligaments are suboptimally evaluated by CT.

Muscles and Tendons
Muscles are normal. No muscle atrophy. No intramuscular fluid
collection or hematoma. Triceps tendon is intact. Biceps tendon is
intact.

Soft tissue
No fluid collection or hematoma. No soft tissue mass.
IMPRESSION: 1. Moderate-severe osteoarthritis of the radiocapitellar joint.
Moderate osteoarthritis of the ulnohumeral joint. Small elbow joint
effusion.

## 2022-05-03 ENCOUNTER — Other Ambulatory Visit (HOSPITAL_COMMUNITY): Payer: Self-pay | Admitting: Nurse Practitioner

## 2022-06-27 ENCOUNTER — Other Ambulatory Visit (HOSPITAL_COMMUNITY): Payer: Self-pay | Admitting: Nurse Practitioner

## 2022-08-17 ENCOUNTER — Other Ambulatory Visit: Payer: Self-pay | Admitting: Cardiology

## 2022-09-12 ENCOUNTER — Emergency Department (HOSPITAL_BASED_OUTPATIENT_CLINIC_OR_DEPARTMENT_OTHER): Payer: 59 | Admitting: Radiology

## 2022-09-12 ENCOUNTER — Encounter (HOSPITAL_BASED_OUTPATIENT_CLINIC_OR_DEPARTMENT_OTHER): Payer: Self-pay

## 2022-09-12 ENCOUNTER — Emergency Department (HOSPITAL_BASED_OUTPATIENT_CLINIC_OR_DEPARTMENT_OTHER)
Admission: EM | Admit: 2022-09-12 | Discharge: 2022-09-12 | Disposition: A | Discharge status: Home / Self Care | Payer: 59 | Attending: Emergency Medicine | Admitting: Emergency Medicine

## 2022-09-12 DIAGNOSIS — R072 Precordial pain: Secondary | ICD-10-CM | POA: Insufficient documentation

## 2022-09-12 DIAGNOSIS — Z7982 Long term (current) use of aspirin: Secondary | ICD-10-CM | POA: Insufficient documentation

## 2022-09-12 DIAGNOSIS — Z79899 Other long term (current) drug therapy: Secondary | ICD-10-CM | POA: Insufficient documentation

## 2022-09-12 DIAGNOSIS — Z7901 Long term (current) use of anticoagulants: Secondary | ICD-10-CM | POA: Insufficient documentation

## 2022-09-12 DIAGNOSIS — I4891 Unspecified atrial fibrillation: Secondary | ICD-10-CM | POA: Insufficient documentation

## 2022-09-12 DIAGNOSIS — Z8679 Personal history of other diseases of the circulatory system: Secondary | ICD-10-CM | POA: Insufficient documentation

## 2022-09-12 DIAGNOSIS — I10 Essential (primary) hypertension: Secondary | ICD-10-CM | POA: Diagnosis not present

## 2022-09-12 LAB — CBC
HCT: 44.7 % (ref 39.0–52.0)
Hemoglobin: 15.1 g/dL (ref 13.0–17.0)
MCH: 31.5 pg (ref 26.0–34.0)
MCHC: 33.8 g/dL (ref 30.0–36.0)
MCV: 93.3 fL (ref 80.0–100.0)
Platelets: 193 10*3/uL (ref 150–400)
RBC: 4.79 MIL/uL (ref 4.22–5.81)
RDW: 11.7 % (ref 11.5–15.5)
WBC: 8.1 10*3/uL (ref 4.0–10.5)
nRBC: 0 % (ref 0.0–0.2)

## 2022-09-12 LAB — TROPONIN I (HIGH SENSITIVITY): Troponin I (High Sensitivity): 3 ng/L (ref <18)

## 2022-09-12 LAB — BASIC METABOLIC PANEL
Anion gap: 8 (ref 5–15)
BUN: 17 mg/dL (ref 6–20)
CO2: 28 mmol/L (ref 22–32)
Calcium: 9.1 mg/dL (ref 8.9–10.3)
Chloride: 104 mmol/L (ref 98–111)
Creatinine, Ser: 1.18 mg/dL (ref 0.61–1.24)
GFR, Estimated: >60 mL/min (ref >60)
Glucose, Bld: 141 mg/dL — ABNORMAL HIGH (ref 70–99)
Potassium: 4.3 mmol/L (ref 3.5–5.1)
Sodium: 140 mmol/L (ref 135–145)

## 2022-09-12 NOTE — ED Triage Notes (Signed)
He reports 4-5 episodes of left-sided chest pain (without diaphoresis or SOB); the frst one of which was yesterday at ~1200 hours, and the most recent of which was at ~0400 today. He states that these episodes of pain radiated to left jaw area. He denies any pain nor discomfort at present. His skin is normal, warm and dry and he is breathing normally.

## 2022-09-12 NOTE — ED Notes (Signed)
Discharge paperwork given and verbally understood. 

## 2022-09-12 NOTE — ED Notes (Addendum)
Provider consulted with Cards, Provider aware of HR and cleared discharge. Pt had no current pain, N/V, SOB, numbness/tingling or any other aliment to note.

## 2022-09-12 NOTE — ED Provider Notes (Addendum)
[facility] EMERGENCY DEPT Provider Note   CSN: [id number] Arrival date & time: 09/12/22  6967     History  Chief Complaint  Patient presents with   Chest Pain    Eric Savage is a 56 y.o. male.  Patient with history of hypertension, hyperlipidemia, CAD, afib on Xarelto presents today with complaints of chest pain. He states that same has been intermittent in nature since yesterday at noon. States that the pain is substernal and sharp in nature and seems to radiate into his jaw. States these episodes never last more than 10 minutes. His last episode was at 4am this morning and he has been pain free since. Denies any history of similar symptoms previously. Denies any shortness of breath, nausea, vomiting, or diaphoresis. Denies any missed doses of xarelto. Patient does state that he smokes 1 pack/day.  The history is provided by the patient. No language interpreter was used.  Chest Pain      Home Medications Prior to Admission medications   Medication Sig Start Date End Date Taking? Authorizing Provider  acetaminophen (TYLENOL) 500 MG tablet Take 500 mg by mouth every 8 (eight) hours as needed (for pain.).     [provider]  ALPRAZolam Prudy Feeler) 0.5 MG tablet Take 1 mg by mouth at bedtime.    [provider]  aspirin EC 81 MG tablet Take 81 mg by mouth at bedtime.    [provider]  atorvastatin (LIPITOR) 40 MG tablet TAKE 1 TABLET BY MOUTH EVERY DAY 03/02/22   Marykay Lex, MD  lisinopril (ZESTRIL) 20 MG tablet Take 1 tablet (20 mg total) by mouth daily. TAKE 1 TABLET BY MOUTH EVERY DAY appt req for refill [id number] 08/21/22   Newman Nip, NP  metoprolol succinate (TOPROL-XL) 25 MG 24 hr tablet TAKE 1 TABLET BY MOUTH EVERYDAY AT BEDTIME 05/04/22   Newman Nip, NP  metoprolol tartrate (LOPRESSOR) 25 MG tablet TAKE 1/2 TABLET BY MOUTH 2 TIMES DAILY. 06/28/22   Marykay Lex, MD  omeprazole (PRILOSEC) 40 MG capsule Take 40  mg by mouth at bedtime.     [provider]  XARELTO 20 MG TABS tablet TAKE 1 TABLET BY MOUTH DAILY WITH SUPPER 05/04/22   Newman Nip, NP      Allergies    Other and Nsaids    Review of Systems   Review of Systems  Cardiovascular:  Positive for chest pain.  All other systems reviewed and are negative.   Physical Exam Updated Vital Signs BP (!) 171/85 (BP Location: Right Arm)   Pulse (!) 46   Temp 98.9 F (37.2 C) (Oral)   Resp 16   SpO2 98%  Physical Exam Vitals and nursing note reviewed.  Constitutional:      General: He is not in acute distress.    Appearance: Normal appearance. He is normal weight. He is not ill-appearing, toxic-appearing or diaphoretic.  HENT:     Head: Normocephalic and atraumatic.  Cardiovascular:     Rate and Rhythm: Normal rate and regular rhythm.     Pulses:          Radial pulses are 2+ on the right side and 2+ on the left side.       Dorsalis pedis pulses are 2+ on the right side and 2+ on the left side.     Heart sounds: Normal heart sounds.  Pulmonary:     Effort: Pulmonary effort is normal. No respiratory distress.  Breath sounds: Normal breath sounds.  Chest:     Chest wall: No tenderness.  Abdominal:     Palpations: Abdomen is soft.  Musculoskeletal:        General: Normal range of motion.     Cervical back: Normal range of motion and neck supple.  Skin:    General: Skin is warm and dry.  Neurological:     General: No focal deficit present.     Mental Status: He is alert.  Psychiatric:        Mood and Affect: Mood normal.        Behavior: Behavior normal.     ED Results / Procedures / Treatments   Labs (all labs ordered are listed, but only abnormal results are displayed) Labs Reviewed  BASIC METABOLIC PANEL - Abnormal; Notable for the following components:      Result Value   Glucose, Bld 141 (*)    All other components within normal limits  CBC  TROPONIN I (HIGH SENSITIVITY)    EKG EKG  Interpretation  Date/Time:  Wednesday September 12 2022 08:25:41 EST Ventricular Rate:  45 PR Interval:  137 QRS Duration: 94 QT Interval:  515 QTC Calculation: 446 R Axis:   70 Text Interpretation: Sinus bradycardia Confirmed by Edwin Dada ([id number]) on 09/12/2022 8:54:39 AM  Radiology DG Chest 2 View  Result Date: 09/12/2022 CLINICAL DATA:  He reports 4-5 episodes of left-sided chest pain (without diaphoresis or SOB); the frst one of which was yesterday at APPROX.1200 hours, and the most recent of which was at APPROX.0400 today EXAM: CHEST - 2 VIEW COMPARISON:  None Available. FINDINGS: Lungs are clear. Heart size and mediastinal contours are within normal limits. Coronary stent. No effusion.  No pneumothorax. Visualized bones unremarkable.  Cholecystectomy clips. IMPRESSION: No acute cardiopulmonary disease. Electronically Signed   By: Corlis Leak M.D.   On: 09/12/2022 08:52    Procedures Procedures    Medications Ordered in ED Medications - No data to display  ED Course/ Medical Decision Making/ A&P                           Medical Decision Making Amount and/or Complexity of Data Reviewed Labs: ordered. Radiology: ordered.   This patient is a 56 y.o. male who presents to the ED for concern of chest pain, this involves an extensive number of treatment options, and is a complaint that carries with it a high risk of complications and morbidity. The emergent differential diagnosis prior to evaluation includes, but is not limited to,  ACS, pericarditis, aortic dissection, PE, pneumothorax, esophageal spasm or rupture, chronic angina, valvular disease, cardiomyopathy, myocarditis, pulmonary HTN, pneumonia, bronchitis, GERD, reflux/PUD, biliary disease, pancreatitis, disk disease, costochondritis, anxiety or panic attack  This is not an exhaustive differential.   Past Medical History / Co-morbidities / Social History: Hx CAD with RCA stent placed in 09/2009, hypertension, hyperlipidemia,  tobacco use, afib on xarelto  Additional history: Chart reviewed. Pertinent results include: Patient with several previous visits with documented heartrates in the 40s  Physical Exam: Physical exam performed. The pertinent findings include: no acute findings  Lab Tests: I ordered, and personally interpreted labs.  The pertinent results include:  Glucose 141. Troponin 3. No other acute laboratory findings. Given that his last episode of pain was greater than 4 hours after troponin collection, delta troponin not required.   Imaging Studies: I ordered imaging studies including CXR. I independently visualized and interpreted imaging  which showed NAD. I agree with the radiologist interpretation.   Cardiac Monitoring:  The patient was maintained on a cardiac monitor.  My attending physician Dr. Wallace Cullens viewed and interpreted the cardiac monitored which showed an underlying rhythm of: sinus bradycardia. I agree with this interpretation.   Consultations Obtained: I requested consultation with the cardiology on call Dr. Excell Seltzer,  and discussed lab and imaging findings as well as pertinent plan - they recommend: no change to beta blocker regimen as patient does not seem to be experiencing symptomatic bradycardia    Disposition:  After evaluating all of the data points in this case, the presentation of COLTER MAGOWAN is NOT consistent with Acute Coronary Syndrome (ACS) and/or myocardial ischemia, pulmonary embolism, aortic dissection; significant arrythmia, pneumothorax, cardiac tamponade, or other emergent cardiopulmonary condition.  Further, the presentation of SABASTIEN TYLER is NOT consistent with pericarditis, myocarditis, cholecystitis, pancreatitis, mediastinitis, endocarditis, new valvular disease.  Additionally, the presentation of DELNO BLAISDELL NOT consistent with flail chest, cardiac contusion, ARDS, or significant intra-thoracic bleeding.  Moreover, this presentation is NOT  consistent with pneumonia or sepsis  The patient has a   Heart score 3, low risk. Therefore he is stable for discharge. He does have a significant heart history, therefore will recommend close cardiology follow-up outpatient. Patient is understanding and amenable with plan, educated on red flag symptoms that would prompt immediate return. Patient discharged in stable condition.   Strict return and follow-up precautions have been given by me personally or by detailed written instruction given verbally by nursing staff using the teach back method to the patient/family/caregiver(s).  Data Reviewed/Counseling: I have reviewed the patient's vital signs, nursing notes, and other relevant tests/information. I had a detailed discussion regarding the historical points, exam findings, and any diagnostic results supporting the discharge diagnosis. I also discussed the need for outpatient follow-up and the need to return to the ED if symptoms worsen or if there are any questions or concerns that arise at home.   I discussed this case with my attending physician Dr. Wallace Cullens who cosigned this note including patient's presenting symptoms, physical exam, and planned diagnostics and interventions. Attending physician stated agreement with plan or made changes to plan which were implemented.     Final Clinical Impression(s) / ED Diagnoses Final diagnoses:  Precordial chest pain    Rx / DC Orders ED Discharge Orders          Ordered    Ambulatory referral to Cardiology       Comments: If you have not heard from the Cardiology office within the next 72 hours please call [phone].   09/12/22 0935          An After Visit Summary was printed and given to the patient.     Vear Clock 09/12/22 [id number]    Silva Bandy, PA-C 09/12/22 1019    Franne Forts, DO 09/26/22 [phone]

## 2022-09-12 NOTE — Discharge Instructions (Signed)
You were seen in the emergency department today for chest pain.  As we discussed your lab work, EKG, chest x-ray all looked reassuring today.  I recommend monitoring your stress levels.  Continue to monitor how you are doing overall, and return to the emergency department for any new or worsening symptoms such as: Worsening pain or pain with exertion, difficulty breathing, sweating, or pain or swelling in your legs.  Additionally, I have given you a referral to the cardiology office that you used to follow with. They should call you in 72 hours to schedule an appointment for follow-up of your symptoms.

## 2022-09-12 NOTE — ED Notes (Addendum)
Provider informed of low HR, Provider consulting with Cards for recommendation. Discharge on hold, Pt informed.

## 2022-09-13 ENCOUNTER — Ambulatory Visit: Payer: 59 | Attending: Cardiology | Admitting: Cardiovascular Disease

## 2022-09-13 ENCOUNTER — Encounter: Payer: Self-pay | Admitting: Cardiovascular Disease

## 2022-09-13 VITALS — BP 138/82 | HR 54 | Ht 65.0 in | Wt 175.2 lb

## 2022-09-13 DIAGNOSIS — R079 Chest pain, unspecified: Secondary | ICD-10-CM | POA: Diagnosis not present

## 2022-09-13 DIAGNOSIS — K219 Gastro-esophageal reflux disease without esophagitis: Secondary | ICD-10-CM

## 2022-09-13 DIAGNOSIS — I25118 Atherosclerotic heart disease of native coronary artery with other forms of angina pectoris: Secondary | ICD-10-CM

## 2022-09-13 DIAGNOSIS — E7800 Pure hypercholesterolemia, unspecified: Secondary | ICD-10-CM

## 2022-09-13 DIAGNOSIS — I480 Paroxysmal atrial fibrillation: Secondary | ICD-10-CM

## 2022-09-13 DIAGNOSIS — F17200 Nicotine dependence, unspecified, uncomplicated: Secondary | ICD-10-CM

## 2022-09-13 DIAGNOSIS — I10 Essential (primary) hypertension: Secondary | ICD-10-CM

## 2022-09-13 DIAGNOSIS — D6869 Other thrombophilia: Secondary | ICD-10-CM

## 2022-09-13 NOTE — Progress Notes (Signed)
Cardiology Office Note:    Date:  09/15/2022   ID:  Eric Savage, DOB 1966/03/13, MRN [id number]  PCP:  Marva Panda, NP   [facility] Providers Cardiologist:  [name], MD     Referring MD: Silva Bandy, PA-C   Chief Complaint  Patient presents with   Chest Pain    History of Present Illness:    Eric Savage is a 56 y.o. male with a hx of early onset coronary artery disease (stents to the LCx and RCA in 2010, patent by cath in 2014, low risk nuclear stress test 2020), smoking, hypercholesterolemia, paroxysmal atrial fibrillation, excessive alcohol use, presents in follow-up after emergency room evaluation for chest pain couple of days ago.  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  He spent the entire morning laying cable, carrying heavy loads without chest discomfort.  Sitting down a lot she developed sharp chest pain radiating from the retrosternal area to his right cheek area and all the way to the top of his head.  It was very sharp.  The lasted for 5-8 minutes.  It happened again for 5 times the same afternoon and he had an episode that woke him up from sleep at 4 AM.  In the emergency room his electrocardiogram was normal except for marked sinus bradycardia at 45 bpm (he had taken extra doses of metoprolol tartrate thinking that maybe these were attacks of atrial fibrillation).  Cardiac enzymes are normal.  Routine chemistries are also normal except for a nonfasting glucose of 141.  Chest x-ray was unremarkable  Over the last 3 weeks he has had a lot of episodes of GERD.  He takes omeprazole 40 mg daily.  He has previously had a cholecystectomy.  He denies dyspnea at rest or activity, orthopnea, PND, lower extremity edema, intermittent claudication, focal neurological complaints, erectile dysfunction.  Stopped taking his atorvastatin about 4 months ago since he thought it was causing memory problems.  After stopping it the memory  problems are no better, they may actually be a little worse.  He reports drinking 1/5 of liquor weekly, sometimes he will take several nights weeks from the bottle and 1 night.  He is smoking about a pack of cigarettes a day.  His wife smokes 2 packs a day and has no intention of quitting.  He is a heavy snorer.  He enjoys fishing, but has had little opportunity to do that recently.  He finds that he is under a lot of psychological stress, being pulled in multiple directions.  He feels that the stress brings out his symptoms of chest discomfort.  His last lipid profile was in 2021 his LDL cholesterol was 47.  2 years earlier, presumably when he was off treatment, his LDL was 149.  Past Medical History:  Diagnosis Date   Acid reflux    Anxiety    Arthritis    CAD S/P percutaneous coronary angioplasty 12/10   RCA/CFX DES   Chest pain 01/2013   Evaluate cardiac catheterization, no obstructive CAD   Diverticulitis    Dysrhythmia    OCC PALPITATION    High cholesterol    History of kidney stones    Hypertension     Past Surgical History:  Procedure Laterality Date   CHOLECYSTECTOMY N/A 08/04/2018   Procedure: LAPAROSCOPIC CHOLECYSTECTOMY;  Surgeon: Violeta Gelinas, MD;  Location: [facility] OR;  Service: General;  Laterality: N/A;   choleycystectomy  2020   COLONOSCOPY     CORONARY  ANGIOPLASTY WITH STENT PLACEMENT  09/2009   Proximal RCA (95%-60%) - Cypher DES 3.0 mm but 33 mm --> 3.75 mm; pCFX 80%: PCI - XIENCE V. DES 2.75 mm x 15 mm --> 3.0 mm   EXTRACORPOREAL SHOCK WAVE LITHOTRIPSY Left 06/27/2020   Procedure: LEFT EXTRACORPOREAL SHOCK WAVE LITHOTRIPSY (ESWL);  Surgeon: Bjorn Pippin, MD;  Location: [facility];  Service: Urology;  Laterality: Left;   LAPAROSCOPIC SIGMOID COLECTOMY N/A 06/05/2017   Procedure: SIGMOID COLECTOMY;  Surgeon: Violeta Gelinas, MD;  Location: [facility] OR;  Service: General;  Laterality: N/A;   LEFT HEART CATHETERIZATION WITH CORONARY ANGIOGRAM N/A 05/19/2012    Procedure: LEFT HEART CATHETERIZATION WITH CORONARY ANGIOGRAM;  Surgeon: Marykay Lex, MD;  Location: [facility] CATH LAB;::  Cx & RCA stents patent. Moderate D1 & D2 as well as ostial AVG Cx.     LEFT HEART CATHETERIZATION WITH CORONARY ANGIOGRAM N/A 01/28/2013   Procedure: LEFT HEART CATHETERIZATION WITH CORONARY ANGIOGRAM;  Surgeon: Lennette Bihari, MD;  Location: [facility] CATH LAB;  Service: Cardiovascular:  No evidence for restenosis of either RCA or LCX. Mild LAD 20% narrowing and 60 - 70% smooth mid diagonal 2 stenosis.   NM MYOCAR PERF WALL MOTION  01/04/2011   Protocol:Bruce, post stress EF=69%, Exercise Cap 13 METS, attenuation defect in inferior region of myocardium, no sig. ischemia demonstrated   TENNIS ELBOW RELEASE/NIRSCHEL PROCEDURE Right 12/16/2019   Procedure: DEBRIDEMENT OF RIGHT ELBOW, EXCISION OSTEOPHYTES;  Surgeon: Bjorn Pippin, MD;  Location: [facility];  Service: Orthopedics;  Laterality: Right;   TONSILLECTOMY  ~ 1973   TOTAL HIP ARTHROPLASTY Left 09/23/2020   Procedure: TOTAL HIP ARTHROPLASTY;  Surgeon: Frederico Hamman, MD;  Location: [facility] ORS;  Service: Orthopedics;  Laterality: Left;   TRANSTHORACIC ECHOCARDIOGRAM  05/13/2009   EF =>55%, mild mitral regurg, trace tricuspid regurg.   ULNAR NERVE TRANSPOSITION Right 12/16/2019   Procedure: ULNAR NERVE DECOMPRESSION/TRANSPOSITION;  Surgeon: Bjorn Pippin, MD;  Location:  SURGERY CENTER;  Service: Orthopedics;  Laterality: Right;    Current Medications: Current Meds  Medication Sig   acetaminophen (TYLENOL) 500 MG tablet Take 500 mg by mouth every 8 (eight) hours as needed (for pain.).    ALPRAZolam (XANAX) 0.5 MG tablet Take 1 mg by mouth at bedtime.   aspirin EC 81 MG tablet Take 81 mg by mouth at bedtime.   HYDROcodone-acetaminophen (NORCO) 7.5-325 MG tablet Take 1 tablet by mouth every 4 (four) hours as needed.   lisinopril (ZESTRIL) 20 MG tablet Take 1 tablet (20 mg total) by mouth daily. TAKE 1 TABLET BY  MOUTH EVERY DAY appt req for refill [id number]   metoprolol succinate (TOPROL-XL) 25 MG 24 hr tablet TAKE 1 TABLET BY MOUTH EVERYDAY AT BEDTIME   metoprolol tartrate (LOPRESSOR) 25 MG tablet TAKE 1/2 TABLET BY MOUTH 2 TIMES DAILY.   omeprazole (PRILOSEC) 40 MG capsule Take 40 mg by mouth at bedtime.    XARELTO 20 MG TABS tablet TAKE 1 TABLET BY MOUTH DAILY WITH SUPPER     Allergies:   Other and Nsaids   Social History   Socioeconomic History   Marital status: Married    Spouse name: Not on file   Number of children: Not on file   Years of education: Not on file   Highest education level: Not on file  Occupational History   Occupation: Unemployed  Tobacco Use   Smoking status: Every Day    Packs/day: 0.50    Years: 30.00  Total pack years: 15.00    Types: Cigarettes   Smokeless tobacco: Never  Vaping Use   Vaping Use: Never used  Substance and Sexual Activity   Alcohol use: Yes    Alcohol/week: 8.0 - 10.0 standard drinks of alcohol    Types: 8 - 10 Shots of liquor per week    Comment: twice a week   Drug use: Not Currently    Frequency: 7.0 times per week    Types: Marijuana, LSD    Comment: 01/28/2013 last drug use ">10 yr ago"; had reported marijuana use several times a week   Sexual activity: Not Currently    Birth control/protection: None  Other Topics Concern   Not on file  Social History Narrative   Single - but has reunited with his "ex-wife", 2 children, 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   Is active, but with a re-established spousal relationship, is eating a lot more "good - home cooked meals. -->  Often high in salt.      He reports drinking a 1/5 of liquor weekly. Takes " swigs form the bottle, sometimes 7-8 in one night. Reports that he does not do this nightly. He does smoke. He snores loudly. He drinks moderate caffeine   Social Determinants of Corporate investment banker Strain: Not on file  Food  Insecurity: Not on file  Transportation Needs: Not on file  Physical Activity: Not on file  Stress: Not on file  Social Connections: Not on file     Family History: The patient's family history includes CAD (age of onset: 16) in his father.  ROS:   Please see the history of present illness.     All other systems reviewed and are negative.  EKGs/Labs/Other Studies Reviewed:    The following studies were reviewed today:  Nuclear stress test 08/28/2019   The left ventricular ejection fraction is normal (55-65%). Nuclear stress EF: 56%. There was no ST segment deviation noted during stress. The study is normal. This is a low risk study.    Echocardiogram 08/17/2020  1. Left ventricular ejection fraction, by estimation, is 55 to 60%. The  left ventricle has normal function. The left ventricle has no regional  wall motion abnormalities. Left ventricular diastolic parameters are  indeterminate.   2. Right ventricular systolic function is normal. The right ventricular  size is normal.   3. The mitral valve is grossly normal. Mild to moderate mitral valve  regurgitation.   4. The aortic valve is normal in structure. Aortic valve regurgitation is  not visualized.   5. The inferior vena cava is normal in size with <50% respiratory  variability, suggesting right atrial pressure of 8 mmHg.   Comparison(s): No prior Echocardiogram.   Cardiac catheterization 01/28/2013  ANGIOGRAPHIC RESULTS:    1. Left main: normal 2. LAD: 20% proximal smooth narrowing; 60 - 70% smooth mid diagonal 2 narrowing 3. Left circumflex: normal with patent stent  4. Right coronary artery: normal with patent stent   5.  Left ventriculography; RAO left ventriculogram was performed using  25 mL of Omnipaque dye at 12 mL/second. The overall LVEF estimated  60 - 65 %  without wall motion abnormalities.     EKG:  EKG is not ordered today.  The ekg ordered 09/12/2022 demonstrates mild sinus bradycardia but  otherwise normal  Recent Labs: 09/12/2022: BUN 17; Creatinine, Ser 1.18; Hemoglobin 15.1; Platelets 193; Potassium 4.3; Sodium 140  Recent  Lipid Panel    Component Value Date/Time   CHOL 103 08/24/2019 0936   TRIG 92 08/24/2019 0936   HDL 38 (L) 08/24/2019 0936   CHOLHDL 2.7 08/24/2019 0936   CHOLHDL 5.2 12/18/2016 1034   VLDL 19 12/18/2016 1034   LDLCALC 47 08/24/2019 0936     Risk Assessment/Calculations:    CHA2DS2-VASc Score =  2 (CAD, HTN)   Physical Exam:    VS:  BP 138/82   Pulse (!) 54   Ht 5\' 5"  (1.651 m)   Wt 79.5 kg   SpO2 99%   BMI 29.15 kg/m     Wt Readings from Last 3 Encounters:  09/13/22 79.5 kg  08/08/21 81.6 kg  09/23/20 83.9 kg     GEN: Smells of tobacco and alcohol, but does not appear intoxicated.  Well nourished, well developed in no acute distress HEENT: Normal NECK: No JVD; No carotid bruits LYMPHATICS: No lymphadenopathy CARDIAC: RRR, no murmurs, rubs, gallops RESPIRATORY:  Clear to auscultation without rales, wheezing or rhonchi  ABDOMEN: Soft, non-tender, non-distended MUSCULOSKELETAL:  No edema; No deformity  SKIN: Warm and dry NEUROLOGIC:  Alert and oriented x 3 PSYCHIATRIC:  Normal affect   ASSESSMENT:    1. Coronary artery disease involving native coronary artery of native heart with other form of angina pectoris (HCC)   2. Chest pain of uncertain etiology   3. Gastroesophageal reflux disease, unspecified whether esophagitis present   4. Hypercholesterolemia   5. PAF (paroxysmal atrial fibrillation) (HCC)   6. Acquired thrombophilia (HCC)   7. Essential hypertension   8. Smoker    PLAN:    In order of problems listed above:  CAD: Chest pain has both typical and atypical features, but it is reassuring that he was able to perform physical activity without worsening chest discomfort.  Long history of coronary problems with onset at a very young age, so I think further evaluation is appropriate, with a nuclear stress test.   Does not appear to require imminent coronary angiography. GERD: He has prominent symptoms of GERD although he is taking 1 omeprazole.  Suggested adding an H2 blocker such as ranitidine or famotidine.  If this does not help, he should see a GI specialist. HLP: Target LDL less than 70 (needs an updated lipid profile).  He has stopped taking atorvastatin since he was concerned that this is causing memory issues.  If he cannot take statins will prescribe PCSK9 inhibitor such as Repatha or Praluent. AFib: Difficult to say whether he has had any recent events.  Advised a personal electronic monitoring device such as a smart watch or Kardia device.  That way we can tell whether her symptoms are related to arrhythmia. Anticoagulation: Denies injuries, falls or bleeding complications. HTN: Borderline elevated blood pressure today.  Asked him to monitor at home but did not make any changes to his medications.  Unable to increase maintenance beta-blocker dose due to bradycardia. Smoking: Strongly recommend smoking cessation.  His wife is also heavy smoker and he reports that she has no intention of quitting.  It would be very difficult for him to stop smoking if she continues.  Suggest they both receive tobacco counseling.      Shared Decision Making/Informed Consent The risks [chest pain, shortness of breath, cardiac arrhythmias, dizziness, blood pressure fluctuations, myocardial infarction, stroke/transient ischemic attack, nausea, vomiting, allergic reaction, radiation exposure, metallic taste sensation and life-threatening complications (estimated to be 1 in 10,000)], benefits (risk stratification, diagnosing coronary artery disease, treatment guidance)  and alternatives of a nuclear stress test were discussed in detail with Mr. Electa SniffBarnett and he agrees to proceed.    Medication Adjustments/Labs and Tests Ordered: Current medicines are reviewed at length with the patient today.  Concerns regarding medicines are  outlined above.  Orders Placed This Encounter  Procedures   Lipid panel   Cardiac Stress Test: Informed Consent Details: Physician/Practitioner Attestation; Transcribe to consent form and obtain patient signature   MYOCARDIAL PERFUSION IMAGING   No orders of the defined types were placed in this encounter.   Patient Instructions  Medication Instructions:  No Changes In Medications at this time.  *If you need a refill on your cardiac medications before your next appointment, please call your pharmacy*  Lab Work: Please return for FASTING Blood Work WHEN YOU HAVE STRESS TEST  AT [facility] If you have labs (blood work) drawn today and your tests are completely normal, you will receive your results only by: MyChart Message (if you have MyChart) OR A paper copy in the mail If you have any lab test that is abnormal or we need to change your treatment, we will call you to review the results.  Testing/Procedures: Your physician has requested that you have a lexiscan myoview. For further information please visit [email or web address]. Please follow instruction sheet, as given. This will take place at [address]   Follow-Up: At [facility], you and your health needs are our priority.  As part of our continuing mission to provide you with exceptional heart care, we have created designated Provider Care Teams.  These Care Teams include your primary Cardiologist (physician) and Advanced Practice Providers (APPs -  Physician Assistants and Nurse Practitioners) who all work together to provide you with the care you need, when you need it.  Your next appointment:   NEXT AVAILABLE WITH Dr Herbie BaltimoreHARDING (After Stress Test)  The format for your next appointment:   In Person  Provider:   Dr. Herbie BaltimoreHarding     Signed, Thurmon FairMihai Mikaeel Petrow, MD  09/15/2022 9:05 AM    [facility]

## 2022-09-13 NOTE — Patient Instructions (Addendum)
Medication Instructions:  No Changes In Medications at this time.  *If you need a refill on your cardiac medications before your next appointment, please call your pharmacy*  Lab Work: Please return for FASTING Blood Work WHEN YOU HAVE STRESS TEST  AT [facility] If you have labs (blood work) drawn today and your tests are completely normal, you will receive your results only by: MyChart Message (if you have MyChart) OR A paper copy in the mail If you have any lab test that is abnormal or we need to change your treatment, we will call you to review the results.  Testing/Procedures: Your physician has requested that you have a lexiscan myoview. For further information please visit [email or web address] Please follow instruction sheet, as given. This will take place at [address]   Follow-Up: At [facility], you and your health needs are our priority.  As part of our continuing mission to provide you with exceptional heart care, we have created designated Provider Care Teams.  These Care Teams include your primary Cardiologist (physician) and Advanced Practice Providers (APPs -  Physician Assistants and Nurse Practitioners) who all work together to provide you with the care you need, when you need it.  Your next appointment:   NEXT AVAILABLE WITH Dr Herbie Baltimore (After Stress Test)  The format for your next appointment:   In Person  Provider:   Dr. Herbie Baltimore

## 2022-09-14 ENCOUNTER — Telehealth (HOSPITAL_COMMUNITY): Payer: Self-pay

## 2022-09-14 NOTE — Telephone Encounter (Signed)
Detailed instructions left on the patient's answering machine. Asked to call back with any questions. S.Florinda Taflinger EMTP 

## 2022-09-16 ENCOUNTER — Other Ambulatory Visit: Payer: Self-pay | Admitting: Nurse Practitioner

## 2022-09-17 ENCOUNTER — Encounter (HOSPITAL_COMMUNITY): Payer: 59

## 2022-09-18 ENCOUNTER — Ambulatory Visit (HOSPITAL_COMMUNITY): Payer: 59 | Attending: Cardiology

## 2022-09-18 ENCOUNTER — Ambulatory Visit: Payer: 59

## 2022-09-18 DIAGNOSIS — R079 Chest pain, unspecified: Secondary | ICD-10-CM

## 2022-09-18 LAB — MYOCARDIAL PERFUSION IMAGING
Base ST Depression (mm): 0 mm
LV dias vol: 83 mL (ref 62–150)
LV sys vol: 30 mL
Nuc Stress EF: 64 %
Peak HR: 73 {beats}/min
Rest HR: 49 {beats}/min
Rest Nuclear Isotope Dose: 10.9 mCi
SDS: 0
SRS: 0
SSS: 0
ST Depression (mm): 0 mm
Stress Nuclear Isotope Dose: 31.2 mCi
TID: 0.97

## 2022-09-18 MED ORDER — TECHNETIUM TC 99M TETROFOSMIN IV KIT
31.2000 | PACK | Freq: Once | INTRAVENOUS | Status: AC | PRN
  Administered 2022-09-18: 31.2 via INTRAVENOUS

## 2022-09-18 MED ORDER — TECHNETIUM TC 99M TETROFOSMIN IV KIT
10.9000 | PACK | Freq: Once | INTRAVENOUS | Status: AC | PRN
  Administered 2022-09-18: 10.9 via INTRAVENOUS

## 2022-09-18 MED ORDER — REGADENOSON 0.4 MG/5ML IV SOLN
0.4000 mg | Freq: Once | INTRAVENOUS | Status: AC
  Administered 2022-09-18: 0.4 mg via INTRAVENOUS

## 2022-09-19 ENCOUNTER — Other Ambulatory Visit: Payer: Self-pay

## 2022-09-19 DIAGNOSIS — I25118 Atherosclerotic heart disease of native coronary artery with other forms of angina pectoris: Secondary | ICD-10-CM

## 2022-09-19 DIAGNOSIS — E785 Hyperlipidemia, unspecified: Secondary | ICD-10-CM

## 2022-09-19 LAB — LIPID PANEL
Chol/HDL Ratio: 4.5 ratio (ref 0.0–5.0)
Cholesterol, Total: 209 mg/dL — ABNORMAL HIGH (ref 100–199)
HDL: 46 mg/dL (ref >39)
LDL Chol Calc (NIH): 136 mg/dL — ABNORMAL HIGH (ref 0–99)
Triglycerides: 151 mg/dL — ABNORMAL HIGH (ref 0–149)
VLDL Cholesterol Cal: 27 mg/dL (ref 5–40)

## 2022-09-19 MED ORDER — FAMOTIDINE 20 MG PO TABS: ORAL_TABLET | ORAL | 3 refills | Status: DC

## 2022-09-24 ENCOUNTER — Ambulatory Visit: Payer: 59 | Admitting: Cardiology

## 2022-10-04 ENCOUNTER — Ambulatory Visit: Payer: 59 | Admitting: Cardiology

## 2022-10-16 ENCOUNTER — Other Ambulatory Visit (HOSPITAL_COMMUNITY): Payer: Self-pay | Admitting: Cardiology

## 2022-11-01 ENCOUNTER — Ambulatory Visit: Payer: 59 | Attending: Internal Medicine | Admitting: Student

## 2022-11-01 DIAGNOSIS — E785 Hyperlipidemia, unspecified: Secondary | ICD-10-CM

## 2022-11-01 MED ORDER — ROSUVASTATIN CALCIUM 20 MG PO TABS: 20.0000 mg | ORAL_TABLET | Freq: Every day | ORAL | 3 refills | Status: DC

## 2022-11-01 NOTE — Patient Instructions (Signed)
Your Results:             Your most recent labs Goal  Total Cholesterol 209 < 200  Triglycerides 151 < 150  HDL (happy/good cholesterol) 46 > 40  LDL (lousy/bad cholesterol 136 < 70   Medication changes: Start taking rosuvastatin 20 mg daily will repeat fastin lipid lab in 3 months    Cammy Copa, Toyah.D [facility] of [facility] [place] [address], [place], [place] [address]  Phone: [phone]; Fax: [phone]

## 2022-11-01 NOTE — Progress Notes (Signed)
Patient ID: Eric Savage                 DOB: 1965/10/25                    MRN: [id number]      HPI: TRELON PLUSH is a 57 y.o. male patient referred to lipid clinic by Dr.Croitoru. PMH is significant for hypertension, hx of early onset coronary artery disease (stents to the LCx and RCA in 2010, patent by cath in 2014), smoking, hypercholesterolemia, paroxysmal atrial fibrillation, excessive alcohol use.   Patient was in to discuss cholesterol medications. 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 So he research all his medications and he found that Statins may cause memory loss. He has stopped taking for last 4-5 months but his symptoms has not improved   Patient admits that his diet is not great and he can improve. He eats out most lunches when he is at work. He just eats whatever he wants does not His wife does not cook with salt but he adds salt to his cooked food. 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     Family History: father-  heart disease 2 bypass sx at age of 62 and 65  Meternal grand father : heart disease died at age of 43  Paternal grandfather : lung cancer  Social History:  Alcohol: 6-7 drinks per week -encourage him to cut down to 3-4 per week  Smoking: 15 cig per day - not ready to quit smoking nut on scale of 10 he give 6 that it is important for him to quitting right now  Labs: Lipid Panel     Component Value Date/Time   CHOL 209 (H) 09/18/2022 0715   TRIG 151 (H) 09/18/2022 0715   HDL 46 09/18/2022 0715   CHOLHDL 4.5 09/18/2022 0715   CHOLHDL 5.2 12/18/2016 1034   VLDL 19 12/18/2016 1034    [place] 136 (H) 09/18/2022 0715   LABVLDL 27 09/18/2022 0715    Past Medical History:  Diagnosis Date   Acid reflux    Anxiety    Arthritis    CAD S/P percutaneous coronary angioplasty 12/10   RCA/CFX DES   Chest pain 01/2013   Evaluate cardiac catheterization, no obstructive CAD   Diverticulitis    Dysrhythmia    OCC PALPITATION    High cholesterol    History of kidney stones    Hypertension     Current Outpatient Medications on File Prior to Visit  Medication Sig Dispense Refill   acetaminophen (TYLENOL) 500 MG tablet Take 500 mg by mouth every 8 (eight) hours as needed (for pain.).      ALPRAZolam (XANAX) 0.5 MG tablet Take 1 mg by mouth at bedtime.     aspirin EC 81 MG tablet Take 81 mg by mouth at bedtime.     famotidine (PEPCID) 20 MG tablet Take 20 mg daily 90 tablet 3   HYDROcodone-acetaminophen (NORCO) 7.5-325 MG tablet Take 1 tablet by mouth every 4 (four) hours as needed.     lisinopril (ZESTRIL) 20 MG tablet Take  1 tablet (20 mg total) by mouth daily. 30 tablet 11   methocarbamol (ROBAXIN) 500 MG tablet Take 500 mg by mouth every 8 (eight) hours as needed. (Patient not taking: Reported on 09/13/2022)     metoprolol succinate (TOPROL-XL) 25 MG 24 hr tablet TAKE 1 TABLET BY MOUTH EVERYDAY AT BEDTIME 30 tablet 6   metoprolol tartrate (LOPRESSOR) 25 MG tablet TAKE 1/2 TABLET TWICE A DAY BY MOUTH 30 tablet 3   omeprazole (PRILOSEC) 40 MG capsule Take 40 mg by mouth at bedtime.      XARELTO 20 MG TABS tablet TAKE 1 TABLET BY MOUTH DAILY WITH SUPPER 30 tablet 6   Current Facility-Administered Medications on File Prior to Visit  Medication Dose Route Frequency Provider Last Rate Last Admin   sulfamethoxazole-trimethoprim (BACTRIM DS) 800-160 MG per tablet 1 tablet  1 tablet Oral Q12H Irine Seal, MD       sulfamethoxazole-trimethoprim (BACTRIM DS) 800-160 MG per tablet 1 tablet  1 tablet Oral Q12H Irine Seal, MD        Allergies  Allergen Reactions   Other Other (See  Comments)   Nsaids Other (See Comments)    Causes irregular heart beat    Problem  Dyslipidemia, Goal Ldl Below 70   Current Medications: none  Intolerances: Lipitor 40 mg short term memory loss Risk Factors: CAD, chest pain,stents to the LCx and RCA in 2010, patent by cath in 2014) LDL goal: <70 mg/dl Last LDLc: 136 mg/dl 09/18/2022    Dyslipidemia, goal LDL below 70 Assessment:  LDL goal: <70 mg/dl last LDLc   136 mg/dl (09/18/2022) Currently not on any cholesterol medication Intolerance to high intensity statins;Lipitor 40 mg short term memory loss  Memory loss has not improved after 4-5 months after therapy cessation  Discuss other cholesterol medication ( PCSK9i, inclisiran, Nexletol)  Before moving on to non-statins brand name cholesterol lowering agent patient would like to try other statins as his LDLc level was excellent on Lipitor     Plan: Start taking rosuvastatin 20 mg daily  Will repeat lipid lab in 3 months  In future can consider adding/changing to once of the injectables if the statin does not lower LDLc to goal or unable to tolerate     Thank you,  Cammy Copa, Pharm.D [facility] [address] [place], [place] [address]  Phone: [phone]; Fax: [phone]

## 2022-11-01 NOTE — Assessment & Plan Note (Signed)
Assessment:  LDL goal: <70 mg/dl last LDLc   136 mg/dl (09/18/2022) Currently not on any cholesterol medication Intolerance to high intensity statins;Lipitor 40 mg short term memory loss  Memory loss has not improved after 4-5 months after therapy cessation  Discuss other cholesterol medication ( PCSK9i, inclisiran, Nexletol)  Before moving on to non-statins brand name cholesterol lowering agent patient would like to try other statins as his LDLc level was excellent on Lipitor     Plan: Start taking rosuvastatin 20 mg daily  Will repeat lipid lab in 3 months  In future can consider adding/changing to once of the injectables if the statin does not lower LDLc to goal or unable to tolerate

## 2022-11-02 ENCOUNTER — Telehealth: Payer: Self-pay | Admitting: Pharmacist

## 2022-11-02 ENCOUNTER — Encounter: Payer: Self-pay | Admitting: Student

## 2022-11-02 NOTE — Telephone Encounter (Signed)
Applied for Repatha PA (Key: [id number]) PA has been approved through 11/03/2023 However patient wants to try Rosuvastatin  first before going on any injectables.

## 2022-11-15 ENCOUNTER — Encounter (HOSPITAL_COMMUNITY): Payer: Self-pay | Admitting: *Deleted

## 2022-11-19 ENCOUNTER — Other Ambulatory Visit (HOSPITAL_COMMUNITY): Payer: Self-pay | Admitting: Nurse Practitioner

## 2022-11-19 NOTE — Telephone Encounter (Addendum)
Prescription refill request for Xarelto received.   Indication: afib  Last office visit: 09/13/2022 Weight: 79.4 kg Age: 57 Scr: 1.18, 09/12/2022 CrCl: 79 ml/min    Refill sent.

## 2023-02-06 NOTE — Telephone Encounter (Signed)
NA. LVM.

## 2023-03-08 ENCOUNTER — Other Ambulatory Visit: Payer: Self-pay

## 2023-03-08 MED ORDER — METOPROLOL TARTRATE 25 MG PO TABS: ORAL_TABLET | ORAL | 6 refills | Status: DC

## 2023-04-08 ENCOUNTER — Other Ambulatory Visit: Payer: Self-pay | Admitting: Cardiology

## 2023-10-27 ENCOUNTER — Other Ambulatory Visit: Payer: Self-pay | Admitting: Cardiovascular Disease

## 2023-12-05 ENCOUNTER — Other Ambulatory Visit: Payer: Self-pay | Admitting: Cardiovascular Disease

## 2023-12-05 MED ORDER — METOPROLOL SUCCINATE ER 25 MG PO TB24: 25.0000 mg | ORAL_TABLET | Freq: Every day | ORAL | 0 refills | Status: DC

## 2023-12-26 ENCOUNTER — Other Ambulatory Visit: Payer: Self-pay | Admitting: Cardiology

## 2024-01-07 ENCOUNTER — Other Ambulatory Visit: Payer: Self-pay | Admitting: Cardiology

## 2024-01-24 ENCOUNTER — Telehealth: Payer: Self-pay | Admitting: Cardiology

## 2024-01-24 NOTE — Telephone Encounter (Signed)
 Called and spoke to pt.  He is c/o CP (4-8/10) and some SOB after mild exertion.  He is having this every day, and noticed that it started to worsen about 6 months ago.  He states he has access to [facility] but he himself has not had this med prescribed; advised him to not us  this medication if it is not prescribed to him.  He denies any other symptoms.  Plan is to see Asberry Bjornstad, NP at [facility] location on 01/31/24.  ED Precautions reviewed; pt verbalized understanding.

## 2024-01-24 NOTE — Telephone Encounter (Signed)
 Pt c/o of Chest Pain: STAT if active (IN THIS MOMENT) CP, including tightness, pressure, jaw pain, shoulder/upper arm/back pain, SOB, nausea, and vomiting.  1. Are you having CP right now (tightness, pressure, or discomfort)?  No, he says it went away because he's sitting now  2. Are you experiencing any other symptoms (ex. SOB, nausea, vomiting, sweating)?  SOB about 6 months with minimal exertion  3. How long have you been experiencing CP?  About 6 months   4. Is your CP continuous or coming and going?  Coming and going, with minimal exertion   5. Have you taken Nitroglycerin ?  Patient says he has some, but he doesn't take it because the pain isn't severe.

## 2024-01-26 NOTE — Telephone Encounter (Signed)
 I have not seen him since 2024.  His symptoms sound somewhat concerning and he probably needs to be seen.  We can probably put him on my Tuesday schedule and then maybe push the virtual visits back.  We did call him and nitroglycerin  but I [place] cannot since he has not been seen in the office.  If his symptoms are occurring at rest, he needs to go to the hospital but otherwise we can see him in clinic on Tuesday.  Randene Bustard, MD

## 2024-01-27 NOTE — Telephone Encounter (Signed)
 LVMTCB to reschedule pt for 01/28/24 w/Dr. Addie Holstein at 0800 (okay to double book per Dr. Addie Holstein).

## 2024-01-27 NOTE — Telephone Encounter (Signed)
 Pt stated he called back this am but the person he spoke to told him that there were no openings for Dr. Addie Holstein.  He agreed to come in on 01/28/24 at 8am.  Canceled appt w/APP in [place].

## 2024-01-28 ENCOUNTER — Encounter: Payer: Self-pay | Admitting: Cardiology

## 2024-01-28 ENCOUNTER — Ambulatory Visit: Attending: Cardiology | Admitting: Cardiology

## 2024-01-28 VITALS — BP 87/60 | HR 83 | Ht 64.0 in | Wt 188.6 lb

## 2024-01-28 DIAGNOSIS — E861 Hypovolemia: Secondary | ICD-10-CM | POA: Diagnosis not present

## 2024-01-28 DIAGNOSIS — Z716 Tobacco abuse counseling: Secondary | ICD-10-CM

## 2024-01-28 DIAGNOSIS — I25118 Atherosclerotic heart disease of native coronary artery with other forms of angina pectoris: Secondary | ICD-10-CM | POA: Diagnosis not present

## 2024-01-28 DIAGNOSIS — I480 Paroxysmal atrial fibrillation: Secondary | ICD-10-CM

## 2024-01-28 DIAGNOSIS — E785 Hyperlipidemia, unspecified: Secondary | ICD-10-CM

## 2024-01-28 DIAGNOSIS — I2510 Atherosclerotic heart disease of native coronary artery without angina pectoris: Secondary | ICD-10-CM | POA: Insufficient documentation

## 2024-01-28 DIAGNOSIS — I200 Unstable angina: Secondary | ICD-10-CM | POA: Insufficient documentation

## 2024-01-28 DIAGNOSIS — I959 Hypotension, unspecified: Secondary | ICD-10-CM | POA: Insufficient documentation

## 2024-01-28 DIAGNOSIS — Z0120 Encounter for dental examination and cleaning without abnormal findings: Secondary | ICD-10-CM

## 2024-01-28 LAB — LIPID PANEL
Chol/HDL Ratio: 3.1 ratio (ref 0.0–5.0)
Cholesterol, Total: 119 mg/dL (ref 100–199)
HDL: 38 mg/dL — ABNORMAL LOW (ref >39)
LDL Chol Calc (NIH): 53 mg/dL (ref 0–99)
Triglycerides: 165 mg/dL — ABNORMAL HIGH (ref 0–149)
VLDL Cholesterol Cal: 28 mg/dL (ref 5–40)

## 2024-01-28 LAB — COMPREHENSIVE METABOLIC PANEL WITH GFR
ALT: 29 IU/L (ref 0–44)
AST: 18 IU/L (ref 0–40)
Albumin: 4.2 g/dL (ref 3.8–4.9)
Alkaline Phosphatase: 108 IU/L (ref 44–121)
BUN/Creatinine Ratio: 14 (ref 9–20)
BUN: 23 mg/dL (ref 6–24)
Bilirubin Total: 0.4 mg/dL (ref 0.0–1.2)
CO2: 22 mmol/L (ref 20–29)
Calcium: 9.5 mg/dL (ref 8.7–10.2)
Chloride: 102 mmol/L (ref 96–106)
Creatinine, Ser: 1.6 mg/dL — ABNORMAL HIGH (ref 0.76–1.27)
Globulin, Total: 1.7 g/dL (ref 1.5–4.5)
Glucose: 89 mg/dL (ref 70–99)
Potassium: 5.3 mmol/L — ABNORMAL HIGH (ref 3.5–5.2)
Sodium: 138 mmol/L (ref 134–144)
Total Protein: 5.9 g/dL — ABNORMAL LOW (ref 6.0–8.5)
eGFR: 50 mL/min/{1.73_m2} — ABNORMAL LOW (ref >59)

## 2024-01-28 MED ORDER — RIVAROXABAN 20 MG PO TABS: 20.0000 mg | ORAL_TABLET | Freq: Every day | ORAL | 2 refills | Status: DC

## 2024-01-28 MED ORDER — METOPROLOL TARTRATE 25 MG PO TABS: ORAL_TABLET | ORAL | Status: DC

## 2024-01-28 MED ORDER — METOPROLOL SUCCINATE ER 25 MG PO TB24: 25.0000 mg | ORAL_TABLET | Freq: Every day | ORAL | 3 refills | Status: DC

## 2024-01-28 MED ORDER — ISOSORBIDE MONONITRATE ER 30 MG PO TB24: 30.0000 mg | ORAL_TABLET | Freq: Every day | ORAL | 3 refills | Status: DC

## 2024-01-28 NOTE — Patient Instructions (Addendum)
 Medication Instructions:   Do not take  Lisinopril   today , and for next 3 a days after take 1/2 tablet a day    *If you need a refill on your cardiac medications before your next appointment, please call your pharmacy* Other Instructions    before going home go get something to eat and drink  - water    You may return to work  Thursday   January 30, 2024   Discuss with Dentist if you need any other  dental work   Lab Work: CMP Lipid CBC If you have labs (blood work) drawn today and your tests are completely normal, you will receive your results only by: [employer] (if you have MyChart) OR A paper copy in the mail If you have any lab test that is abnormal or we need to change your treatment, we will call you to review the results.   Testing/Procedures: Schedule for May 7 , 2025   at [facility]  arrive at 6:30 am  Your physician has requested that you have a cardiac catheterization. Cardiac catheterization is used to diagnose and/or treat various heart conditions. Doctors may recommend this procedure for a number of different reasons. The most common reason is to evaluate chest pain. Chest pain can be a symptom of coronary artery disease (CAD), and cardiac catheterization can show whether plaque is narrowing or blocking your heart's arteries. This procedure is also used to evaluate the valves, as well as measure the blood flow and oxygen levels in different parts of your heart.  Please follow instruction sheet, as given.    Follow-Up: At [facility], you and your health needs are our priority.  As part of our continuing mission to provide you with exceptional heart care, we have created designated Provider Care Teams.  These Care Teams include your primary Cardiologist (physician) and Advanced Practice Providers (APPs -  Physician Assistants and Nurse Practitioners) who all work together to provide you with the care you need, when you need it.     Your next appointment:   1  month(s)  The format for your next appointment:   In Person  Provider:   Randene Bustard, MD  or APP   Other Instructions    before going home go get something to eat and drink  - water    You may return to work  Thursday   April 24  Eric Savage Dept: [phone] Loc: [phone]  Eric Savage  01/28/2024  You are scheduled for a Cardiac Catheterization on Wednesday, May 7 with Dr. Randene Bustard.  1. Please arrive at the [facility] (Main Entrance A) at [facility]: [address] [place], [place] [address] at 6:30 AM (This time is 2 hour(s) before your procedure to ensure your preparation).   Free valet parking service is available. You will check in at ADMITTING. The support person will be asked to wait in the waiting room.  It is OK to have someone drop you off and come back when you are ready to be discharged.    Special note: Every effort is made to have your procedure done on time. Please understand that emergencies sometimes delay scheduled procedures.  2. Diet: Do not eat solid foods after midnight.  The patient may have clear liquids until 5am upon the day of the procedure.  3. Labs: You will  need to have blood drawn today CMP CBC. You do not need to be fasting.  4. Medication instructions in preparation for your procedure:   Contrast Allergy: No   Stop taking Xarelto  (Rivaroxaban ) on Sunday, May 4.    On the morning of your procedure, take your Aspirin  81 mg and any morning medicines NOT listed above.  You may use sips of water .  5. Plan to go home the same day, you will only stay overnight if medically necessary. 6. Bring a current list of your medications and current insurance cards. 7. You MUST have a responsible person to drive you home. 8. Someone MUST be with you the first 24 hours after you arrive home  or your discharge will be delayed. 9. Please wear clothes that are easy to get on and off and wear slip-on shoes.  Thank you for allowing us  to care for you!   -- [facility]

## 2024-01-28 NOTE — Assessment & Plan Note (Signed)
 Atrial fibrillation without current symptoms.  Remains on low-dose Toprol  25 mg nightly for rate control.  Has not required as needed dosing of the Lopressor  12.5 mg for breakthrough. Previously on Xarelto , stopped due to refill issues. - Restart Xarelto  for anticoagulation.

## 2024-01-28 NOTE — Assessment & Plan Note (Addendum)
 Stents in the LCx and RCA in 2010 with relook cath in 2013 and 2014 and most recent stress test in 2023.  He now presents with concerning exertional chest pain and dyspnea to the point where he can barely walk up a flight of steps without having symptoms. Risk factors include his continued smoking hyperlipidemia and known CAD.  Unfortunately his low blood pressures limited are related to treat. -Continue on 81 mg aspirin , 20 mg rosuvastatin  and 25 mg Toprol  XL. -Temporarily reduce lisinopril  to one half dose for 3 days to allow for blood pressure to increase -Prescribed Imdur  30 mg daily

## 2024-01-28 NOTE — Assessment & Plan Note (Signed)
 Low blood pressure causing dizziness, possibly related to medication and inadequate fluid intake. Advised to adjust lisinopril  and increase fluid intake. - Hold lisinopril  today and reduce dose to half for the next three days. - Encourage increased fluid intake, such as Gatorade, to help raise blood pressure. - Advise against working today due to low blood pressure and dizziness.

## 2024-01-28 NOTE — Assessment & Plan Note (Signed)
 Smoking cessation instruction/counseling given:  counseled patient on the dangers of tobacco use, advised patient to stop smoking, and reviewed strategies to maximize success  4 min discussed the importance of curtailing smoking as well as alcohol use.

## 2024-01-28 NOTE — Assessment & Plan Note (Signed)
 Most recent lipid panel showed LDL of 136 but as of December 2023. Recheck labs today -Continue current dose of rosuvastatin  low threshold to titrate further plus minus add additional medications.

## 2024-01-28 NOTE — Assessment & Plan Note (Addendum)
 Progressively worsening exertional chest pain for 6-8 months.  Normal stress test in December 2023.  Heart catheterization considered to assess for blockages.  Coordination with dental care needed to manage antiplatelet therapy if stenting required. - Order heart catheterization for May 7, contingent on dental clearance (need to ensure that he will not require dental surgery within the next 3 to 6 months. - Check with dentist regarding the need for any dental procedures that may require holding antiplatelet therapy. - Add long-acting nitroglycerin  for symptom relief.  (Imdur  30 mg daily) - Continue aspirin  81 mg daily, rosuvastatin  20 mg daily and low-dose Toprol  25 mg daily.

## 2024-01-28 NOTE — Progress Notes (Signed)
 Cardiology Office Note:  .   Date:  01/28/2024  ID:  Eric Savage, DOB Jan 20, 1966, MRN [id number] PCP: Verlene Glimpse, NP  [facility] Providers Cardiologist:  Randene Bustard, MD     Chief Complaint  Patient presents with   Follow-up   Coronary Artery Disease    Progressively worsening exertional chest pain and dyspnea   Atrial Fibrillation    Has not had a sensation of irregular heartbeats    Patient Profile: .     Eric Savage is an obese 58 y.o. male heavy smoker with a PMH notable for early onset CAD (PCI to LCx and RCA in 2010) who presents here for delayed follow-up with complaints of chest pain.  Originally referred at the request of Verlene Glimpse, NP.  CAD: RCA and LCx PCI (2010), patent by cath in 2014.  Low risk Myoview  2020 CRF's: HLD, smoking PAF Chronic Alcohol Use    Eric Savage was last seen on September 13, 2022 by Dr. Alvis Ba for ER follow-up however he noted to have an EKG showing bradycardia rate 45 bpm.  Apparently he taken extra dose of metoprolol  for 1000 hide of A-fib.  He is also noticing worsening GERD.  No exertional dyspnea chest pain, PND or orthopnea.  Apparently he had stopped taking a statin for fear of memory issues, although did not notice any change with stopping.  Still smoking 2 packs a day with the intention of quitting.  Was also drinking 1/5 of liquor weekly. => Ordered Myoview  stress test and referred to lipid clinic for PCSK9 inhibitor.  Recommended Kardia-Mobile to help identify A-fib episodes.  He was seen in lipid clinic and started on rosuvastatin  20 mg daily plan for 70-month follow-up.  Plan was to consider injectables if no improvement  Subjective  Discussed the use of AI scribe software for clinical note transcription with the patient, who gave verbal consent to proceed. History of Present Illness History of Present Illness Eric Savage is a 58 year old male with coronary artery disease and atrial  fibrillation who presents with chest pain and shortness of breath on exertion.  He has been experiencing chest pain and shortness of breath with minimal exertion for the past six to eight months. The chest pain is triggered by activities such as climbing stairs or taking out the trash and does not occur at rest. The pain is persistent and bothersome, prompting him to seek medical attention. He last experienced chest pain while walking up the stairs to the clinic.  He has a history of coronary artery disease with stents placed in two main arteries and a moderate lesion in a side branch. A stress test in December 2023 was normal, despite similar chest pain at that time. He is currently taking aspirin , lisinopril  20 mg, metoprolol  succinate 25 mg, and rosuvastatin  20 mg. He stopped taking Xarelto  20 mg due to an inability to refill the prescription.  No recent symptoms of atrial fibrillation such as heart racing or skipping beats. No chest pain at rest, shortness of breath when lying flat, waking up at night due to breathing difficulties, or significant leg swelling. He reports dizziness and wooziness, which he attributes to low blood pressure.  He has a dental issue with a problematic tooth extraction for three weeks, causing significant discomfort. He reports an ulcer and dry socket related to the dental issue.  Probably because of this he has not really been eating and drinking as well over the  last couple days especially today regarding his blood pressure being quite low.  Feels somewhat nauseated and queasy today with no energy.  Socially, he smokes seven to twelve cigarettes a day and drinks alcohol every third or fourth day, consuming about four shots each time. He acknowledges that smoking and alcohol may be affecting his health, particularly his chest pain and dental issues.    Objective   Social History:  Alcohol: 6-7 drinks per week -encourage him to cut down to 3-4 per week  Smoking: 6-7  -cig per day - not ready to quit smoking nut on scale of 10 he give 6 that it is important for him to quitting right now   Medications - Aspirin  81 mg daily -( Ran out of Xarelto  20 mg daily-could not get refill because she had not been seen in clinic) - Lisinopril  20 mg; - Metoprolol  succinate 25 mg; - Rosuvastatin  20 mg  - Prilosec 40 mg nightly - Seroquel 50 mg or Xanax  1 mg nightly as needed  Studies Reviewed: Eric Savage   EKG Interpretation Date/Time:  Tuesday January 28 2024 08:24:32 EDT Ventricular Rate:  83 PR Interval:  124 QRS Duration:  76 QT Interval:  378 QTC Calculation: 444 R Axis:   42  Text Interpretation: Normal sinus rhythm Nonspecific T wave abnormality When compared with ECG of 12-Sep-2022 08:25, Rate faster Confirmed by Randene Bustard ([id number]) on 01/28/2024 8:47:22 AM    CATH April 2014: 20% proximal LAD with 60-70 % smooth mid D2, patent LCx (between OM1 and OM2) and mid RCA stent December 2010: PCI to RCA and RCA  ECHO 08/17/2020: EF 55 to 60%.  No RWMA.  Mild to moderate MR.  Borderline elevated RAP.  (Recommended 1 year follow-up) Myoview  09/08/2022: LOW RISK.  No ischemia or infarction.  EF 50 to 65%  Lab Results  Component Value Date   CHOL 209 (H) 09/18/2022   HDL 46 09/18/2022   LDLCALC 136 (H) 09/18/2022   TRIG 151 (H) 09/18/2022   CHOLHDL 4.5 09/18/2022    Risk Assessment/Calculations:    CHA2DS2-VASc Score = 2   This indicates a 2.2% annual risk of stroke. The patient's score is based upon: CHF History: 0 HTN History: 1 Diabetes History: 0 Stroke History: 0 Vascular Disease History: 1 Age Score: 0 Gender Score: 0   Ran out of Xarelto  - no Refill 2/2 no MD visits           Physical Exam:   VS:  BP (!) 87/60 (BP Location: Right Arm, Cuff Size: Normal)   Pulse 83   Ht 5\' 4"  (1.626 m)   Wt 188 lb 9.6 oz (85.5 kg)   SpO2 95%   BMI 32.37 kg/m    Wt Readings from Last 3 Encounters:  01/28/24 188 lb 9.6 oz (85.5 kg)  09/18/22 175 lb (79.4 kg)   09/13/22 175 lb 3.2 oz (79.5 kg)    GEN: Well nourished, well developed in no acute distress; obese; smells of cigarettes & EtOH NECK: No JVD; No carotid bruits CARDIAC: Normal S1, S2; RRR, no murmurs, rubs, gallops RESPIRATORY:  Clear to auscultation without rales, wheezing or rhonchi ; nonlabored, good air movement. ABDOMEN: Soft, non-tender, non-distended EXTREMITIES:  No edema; No deformity      ASSESSMENT AND PLAN: .    Problem List Items Addressed This Visit       Cardiology Problems   Coronary artery disease (Chronic)   Relevant Medications   rivaroxaban  (XARELTO ) 20 MG TABS tablet  metoprolol  succinate (TOPROL -XL) 25 MG 24 hr tablet   metoprolol  tartrate (LOPRESSOR ) 25 MG tablet   isosorbide  mononitrate (IMDUR ) 30 MG 24 hr tablet   Other Relevant Orders   EKG 12-Lead (Completed)   Lipid panel   Comprehensive metabolic panel with GFR   CBC   Hyperlipidemia with target low density lipoprotein (LDL) cholesterol less than 55 mg/dL (Chronic)   Most recent lipid panel showed LDL of 136 but as of December 2023. Recheck labs today -Continue current dose of rosuvastatin  low threshold to titrate further plus minus add additional medications.      Relevant Medications   rivaroxaban  (XARELTO ) 20 MG TABS tablet   metoprolol  succinate (TOPROL -XL) 25 MG 24 hr tablet   metoprolol  tartrate (LOPRESSOR ) 25 MG tablet   isosorbide  mononitrate (IMDUR ) 30 MG 24 hr tablet   Other Relevant Orders   Lipid panel   Comprehensive metabolic panel with GFR   CBC   Hypotension (arterial)   Low blood pressure causing dizziness, possibly related to medication and inadequate fluid intake. Advised to adjust lisinopril  and increase fluid intake. - Hold lisinopril  today and reduce dose to half for the next three days. - Encourage increased fluid intake, such as Gatorade, to help raise blood pressure. - Advise against working today due to low blood pressure and dizziness.      Relevant  Medications   rivaroxaban  (XARELTO ) 20 MG TABS tablet   metoprolol  succinate (TOPROL -XL) 25 MG 24 hr tablet   metoprolol  tartrate (LOPRESSOR ) 25 MG tablet   isosorbide  mononitrate (IMDUR ) 30 MG 24 hr tablet   PAF (paroxysmal atrial fibrillation) (HCC); CHA2DS2-VASc Score 2 (Chronic)   Atrial fibrillation without current symptoms.  Remains on low-dose Toprol  25 mg nightly for rate control.  Has not required as needed dosing of the Lopressor  12.5 mg for breakthrough. Previously on Xarelto , stopped due to refill issues. - Restart Xarelto  for anticoagulation.      Relevant Medications   rivaroxaban  (XARELTO ) 20 MG TABS tablet   metoprolol  succinate (TOPROL -XL) 25 MG 24 hr tablet   metoprolol  tartrate (LOPRESSOR ) 25 MG tablet   isosorbide  mononitrate (IMDUR ) 30 MG 24 hr tablet   Progressive angina (HCC) - Primary   Progressively worsening exertional chest pain for 6-8 months.  Normal stress test in December 2023.  Heart catheterization considered to assess for blockages.  Coordination with dental care needed to manage antiplatelet therapy if stenting required. - Order heart catheterization for May 7, contingent on dental clearance (need to ensure that he will not require dental surgery within the next 3 to 6 months. - Check with dentist regarding the need for any dental procedures that may require holding antiplatelet therapy. - Add long-acting nitroglycerin  for symptom relief.  (Imdur  30 mg daily) - Continue aspirin  81 mg daily, rosuvastatin  20 mg daily and low-dose Toprol  25 mg daily.         Relevant Medications   rivaroxaban  (XARELTO ) 20 MG TABS tablet   metoprolol  succinate (TOPROL -XL) 25 MG 24 hr tablet   metoprolol  tartrate (LOPRESSOR ) 25 MG tablet   isosorbide  mononitrate (IMDUR ) 30 MG 24 hr tablet     Other   Need for assessment by dentistry for poor dentition   Dental issues with potential need for surgery Recent dental extraction with ongoing pain. Coordination with  cardiac care needed due to potential antiplatelet therapy. He prefers to avoid further dental procedures unless necessary. - Clarify with dentist regarding the likelihood of needing dental surgery that would require holding antiplatelet therapy.  Tobacco abuse counseling (Chronic)   Smoking cessation instruction/counseling given:  counseled patient on the dangers of tobacco use, advised patient to stop smoking, and reviewed strategies to maximize success  4 min discussed the importance of curtailing smoking as well as alcohol use.           Informed Consent   Shared Decision Making/Informed Consent The risks [stroke (1 in 1000), death (1 in 1000), kidney failure [usually temporary] (1 in 500), bleeding (1 in 200), allergic reaction [possibly serious] (1 in 200)], benefits (diagnostic support and management of coronary artery disease) and alternatives of a cardiac catheterization were discussed in detail with Eric Savage and he is willing to proceed.      Follow-Up: Return in about 1 month (around 02/27/2024) for Post-cath Followup with me.  TOTAL time spent: 22 min spent with patient + 2 min spent charting = 45 min I spent 45 minutes in the care of Eric Savage today including reviewing labs (1 minute), reviewing studies (6 minutes reviewing prior studies (have not seen him in several years)), face to face time discussing treatment options (22 minutes), reviewing records from clinic notes from lipid clinic and Dr. Karyl Paget Croitoru (4 minutes), 1 minutes dictating, and documenting in the encounter.      Signed, Arleen Lacer, MD, MS Randene Bustard, M.D., M.S. Interventional Cardiologist  [facility]  Pager # [phone] Phone # [phone] [address] [place], [place] [address]

## 2024-01-28 NOTE — Assessment & Plan Note (Signed)
 Dental issues with potential need for surgery Recent dental extraction with ongoing pain. Coordination with cardiac care needed due to potential antiplatelet therapy. He prefers to avoid further dental procedures unless necessary. - Clarify with dentist regarding the likelihood of needing dental surgery that would require holding antiplatelet therapy.

## 2024-01-29 ENCOUNTER — Other Ambulatory Visit: Payer: Self-pay | Admitting: *Deleted

## 2024-01-29 DIAGNOSIS — I200 Unstable angina: Secondary | ICD-10-CM

## 2024-01-29 LAB — CBC
Hematocrit: 41.8 % (ref 37.5–51.0)
Hemoglobin: 13.9 g/dL (ref 13.0–17.7)
MCH: 31.5 pg (ref 26.6–33.0)
MCHC: 33.3 g/dL (ref 31.5–35.7)
MCV: 95 fL (ref 79–97)
Platelets: 283 10*3/uL (ref 150–450)
RBC: 4.41 x10E6/uL (ref 4.14–5.80)
RDW: 12.9 % (ref 11.6–15.4)
WBC: 11.5 10*3/uL — ABNORMAL HIGH (ref 3.4–10.8)

## 2024-01-29 NOTE — Progress Notes (Signed)
 Order placed for upcoming cardiac cath on 02/12/24

## 2024-01-31 ENCOUNTER — Ambulatory Visit: Admitting: Student

## 2024-01-31 ENCOUNTER — Telehealth: Payer: Self-pay | Admitting: Cardiology

## 2024-01-31 DIAGNOSIS — R944 Abnormal results of kidney function studies: Secondary | ICD-10-CM

## 2024-01-31 NOTE — Telephone Encounter (Signed)
 Called and reviewed the lab results with the patient.  He voices understanding.  Will hydrate well and will go into [facility] for repeat BMET on 02/07/24.  Order placed and released.

## 2024-01-31 NOTE — Telephone Encounter (Signed)
 Pt calling in regards to results pt doesn't use Mychart. Please advise

## 2024-02-02 ENCOUNTER — Emergency Department (HOSPITAL_COMMUNITY)

## 2024-02-02 ENCOUNTER — Other Ambulatory Visit: Payer: Self-pay

## 2024-02-02 ENCOUNTER — Encounter (HOSPITAL_COMMUNITY): Payer: Self-pay | Admitting: Emergency Medicine

## 2024-02-02 ENCOUNTER — Inpatient Hospital Stay (HOSPITAL_COMMUNITY)
Admission: EM | Admit: 2024-02-02 | Discharge: 2024-02-04 | DRG: 251 | Disposition: A | Discharge status: Home / Self Care | Attending: Cardiology | Admitting: Cardiology

## 2024-02-02 ENCOUNTER — Telehealth: Payer: Self-pay | Admitting: Internal Medicine

## 2024-02-02 DIAGNOSIS — Z538 Procedure and treatment not carried out for other reasons: Secondary | ICD-10-CM | POA: Diagnosis not present

## 2024-02-02 DIAGNOSIS — R001 Bradycardia, unspecified: Secondary | ICD-10-CM | POA: Diagnosis not present

## 2024-02-02 DIAGNOSIS — Z886 Allergy status to analgesic agent status: Secondary | ICD-10-CM | POA: Diagnosis not present

## 2024-02-02 DIAGNOSIS — I480 Paroxysmal atrial fibrillation: Secondary | ICD-10-CM | POA: Diagnosis present

## 2024-02-02 DIAGNOSIS — I25110 Atherosclerotic heart disease of native coronary artery with unstable angina pectoris: Secondary | ICD-10-CM | POA: Diagnosis present

## 2024-02-02 DIAGNOSIS — F17210 Nicotine dependence, cigarettes, uncomplicated: Secondary | ICD-10-CM | POA: Insufficient documentation

## 2024-02-02 DIAGNOSIS — I5032 Chronic diastolic (congestive) heart failure: Secondary | ICD-10-CM | POA: Diagnosis present

## 2024-02-02 DIAGNOSIS — I25118 Atherosclerotic heart disease of native coronary artery with other forms of angina pectoris: Principal | ICD-10-CM

## 2024-02-02 DIAGNOSIS — I10 Essential (primary) hypertension: Secondary | ICD-10-CM | POA: Diagnosis present

## 2024-02-02 DIAGNOSIS — Z87442 Personal history of urinary calculi: Secondary | ICD-10-CM

## 2024-02-02 DIAGNOSIS — E7849 Other hyperlipidemia: Secondary | ICD-10-CM

## 2024-02-02 DIAGNOSIS — I2510 Atherosclerotic heart disease of native coronary artery without angina pectoris: Secondary | ICD-10-CM

## 2024-02-02 DIAGNOSIS — E7800 Pure hypercholesterolemia, unspecified: Secondary | ICD-10-CM | POA: Diagnosis present

## 2024-02-02 DIAGNOSIS — Z96642 Presence of left artificial hip joint: Secondary | ICD-10-CM | POA: Diagnosis present

## 2024-02-02 DIAGNOSIS — Z955 Presence of coronary angioplasty implant and graft: Secondary | ICD-10-CM

## 2024-02-02 DIAGNOSIS — R079 Chest pain, unspecified: Secondary | ICD-10-CM | POA: Diagnosis not present

## 2024-02-02 DIAGNOSIS — E782 Mixed hyperlipidemia: Secondary | ICD-10-CM | POA: Diagnosis not present

## 2024-02-02 DIAGNOSIS — Z8679 Personal history of other diseases of the circulatory system: Secondary | ICD-10-CM | POA: Diagnosis not present

## 2024-02-02 DIAGNOSIS — T82855A Stenosis of coronary artery stent, initial encounter: Secondary | ICD-10-CM | POA: Diagnosis present

## 2024-02-02 DIAGNOSIS — Z7901 Long term (current) use of anticoagulants: Secondary | ICD-10-CM

## 2024-02-02 DIAGNOSIS — Y831 Surgical operation with implant of artificial internal device as the cause of abnormal reaction of the patient, or of later complication, without mention of misadventure at the time of the procedure: Secondary | ICD-10-CM | POA: Diagnosis present

## 2024-02-02 DIAGNOSIS — Z8719 Personal history of other diseases of the digestive system: Secondary | ICD-10-CM

## 2024-02-02 DIAGNOSIS — Z8249 Family history of ischemic heart disease and other diseases of the circulatory system: Secondary | ICD-10-CM | POA: Diagnosis not present

## 2024-02-02 DIAGNOSIS — I2582 Chronic total occlusion of coronary artery: Secondary | ICD-10-CM | POA: Diagnosis present

## 2024-02-02 DIAGNOSIS — Z79899 Other long term (current) drug therapy: Secondary | ICD-10-CM

## 2024-02-02 DIAGNOSIS — Z560 Unemployment, unspecified: Secondary | ICD-10-CM | POA: Diagnosis not present

## 2024-02-02 DIAGNOSIS — I110 Hypertensive heart disease with heart failure: Secondary | ICD-10-CM | POA: Diagnosis present

## 2024-02-02 DIAGNOSIS — E785 Hyperlipidemia, unspecified: Secondary | ICD-10-CM | POA: Diagnosis present

## 2024-02-02 DIAGNOSIS — Z9861 Coronary angioplasty status: Secondary | ICD-10-CM | POA: Diagnosis not present

## 2024-02-02 DIAGNOSIS — I200 Unstable angina: Secondary | ICD-10-CM | POA: Diagnosis present

## 2024-02-02 LAB — URINALYSIS, ROUTINE W REFLEX MICROSCOPIC
Bilirubin Urine: NEGATIVE
Glucose, UA: NEGATIVE mg/dL
Hgb urine dipstick: NEGATIVE
Ketones, ur: NEGATIVE mg/dL
Leukocytes,Ua: NEGATIVE
Nitrite: NEGATIVE
Protein, ur: NEGATIVE mg/dL
Specific Gravity, Urine: 1.024 (ref 1.005–1.030)
pH: 5 (ref 5.0–8.0)

## 2024-02-02 LAB — CBC
HCT: 41.3 % (ref 39.0–52.0)
Hemoglobin: 13.8 g/dL (ref 13.0–17.0)
MCH: 31.9 pg (ref 26.0–34.0)
MCHC: 33.4 g/dL (ref 30.0–36.0)
MCV: 95.4 fL (ref 80.0–100.0)
Platelets: 221 10*3/uL (ref 150–400)
RBC: 4.33 MIL/uL (ref 4.22–5.81)
RDW: 11.9 % (ref 11.5–15.5)
WBC: 7.3 10*3/uL (ref 4.0–10.5)
nRBC: 0 % (ref 0.0–0.2)

## 2024-02-02 LAB — BASIC METABOLIC PANEL WITH GFR
Anion gap: 11 (ref 5–15)
BUN: 9 mg/dL (ref 6–20)
CO2: 23 mmol/L (ref 22–32)
Calcium: 9 mg/dL (ref 8.9–10.3)
Chloride: 107 mmol/L (ref 98–111)
Creatinine, Ser: 1.12 mg/dL (ref 0.61–1.24)
GFR, Estimated: >60 mL/min (ref >60)
Glucose, Bld: 138 mg/dL — ABNORMAL HIGH (ref 70–99)
Potassium: 3.6 mmol/L (ref 3.5–5.1)
Sodium: 141 mmol/L (ref 135–145)

## 2024-02-02 LAB — TROPONIN I (HIGH SENSITIVITY): Troponin I (High Sensitivity): 9 ng/L (ref <18)

## 2024-02-02 MED ORDER — LISINOPRIL 20 MG PO TABS: 20.0000 mg | ORAL_TABLET | Freq: Every day | ORAL | Status: DC

## 2024-02-02 MED ORDER — SODIUM CHLORIDE 0.9 % IV SOLN: 250.0000 mL | INTRAVENOUS | Status: AC | PRN

## 2024-02-02 MED ORDER — ACETAMINOPHEN 325 MG PO TABS
650.0000 mg | ORAL_TABLET | Freq: Four times a day (QID) | ORAL | Status: DC | PRN
  Administered 2024-02-02: 650 mg via ORAL
  Filled 2024-02-02: qty 2

## 2024-02-02 MED ORDER — ISOSORBIDE MONONITRATE ER 30 MG PO TB24
30.0000 mg | ORAL_TABLET | Freq: Every day | ORAL | Status: DC
  Administered 2024-02-03: 30 mg via ORAL
  Filled 2024-02-02 (×2): qty 1

## 2024-02-02 MED ORDER — ACETAMINOPHEN 650 MG RE SUPP: 650.0000 mg | Freq: Four times a day (QID) | RECTAL | Status: DC | PRN

## 2024-02-02 MED ORDER — SODIUM CHLORIDE 0.9% FLUSH: 3.0000 mL | INTRAVENOUS | Status: DC | PRN

## 2024-02-02 MED ORDER — ASPIRIN 81 MG PO TBEC
81.0000 mg | DELAYED_RELEASE_TABLET | Freq: Every day | ORAL | Status: DC
  Administered 2024-02-03 (×2): 81 mg via ORAL
  Filled 2024-02-02 (×2): qty 1

## 2024-02-02 MED ORDER — PANTOPRAZOLE SODIUM 40 MG PO TBEC
40.0000 mg | DELAYED_RELEASE_TABLET | Freq: Every day | ORAL | Status: DC
  Administered 2024-02-03 – 2024-02-04 (×2): 40 mg via ORAL
  Filled 2024-02-02 (×2): qty 1

## 2024-02-02 MED ORDER — PROCHLORPERAZINE EDISYLATE 10 MG/2ML IJ SOLN: 10.0000 mg | Freq: Four times a day (QID) | INTRAMUSCULAR | Status: DC | PRN

## 2024-02-02 MED ORDER — SODIUM CHLORIDE 0.9% FLUSH
3.0000 mL | Freq: Two times a day (BID) | INTRAVENOUS | Status: DC
  Administered 2024-02-03 – 2024-02-04 (×3): 3 mL via INTRAVENOUS

## 2024-02-02 MED ORDER — ROSUVASTATIN CALCIUM 20 MG PO TABS
20.0000 mg | ORAL_TABLET | Freq: Every day | ORAL | Status: DC
Start: 2024-02-02 — End: 2024-02-04
  Administered 2024-02-02 – 2024-02-04 (×3): 20 mg via ORAL
  Filled 2024-02-02 (×3): qty 1

## 2024-02-02 MED ORDER — SODIUM CHLORIDE 0.9% FLUSH: 3.0000 mL | Freq: Two times a day (BID) | INTRAVENOUS | Status: DC

## 2024-02-02 MED ORDER — HEPARIN BOLUS VIA INFUSION
2000.0000 [IU] | Freq: Once | INTRAVENOUS | Status: AC
  Administered 2024-02-02: 2000 [IU] via INTRAVENOUS
  Filled 2024-02-02: qty 2000

## 2024-02-02 MED ORDER — HEPARIN (PORCINE) 25000 UT/250ML-% IV SOLN
1000.0000 [IU]/h | INTRAVENOUS | Status: DC
  Administered 2024-02-02: 950 [IU]/h via INTRAVENOUS
  Filled 2024-02-02: qty 250

## 2024-02-02 NOTE — ED Triage Notes (Signed)
 Patient coming to ED for evaluation of chest pain.  Reports pain has been intermittent x 6 months.  Has a schedule cardiac cath scheduled for 02/12/2024.  Reports "I don't think I am going to be able to make it to then."  C/o central chest pain that radiates into neck and head.  Called cardiologist and was told to come to ED for further evaluation.  Having SHOB and HA.

## 2024-02-02 NOTE — Telephone Encounter (Signed)
 Patient Phone Call   Received page regarding patient, called back. Patient stated he was on his way into the emergency room now at [facility] as his chest pain is worse. I agreed that if he is having progressive worsening chest pain, it was appropriate for him to be seen in the ER for further workup. He is currently scheduled for outpatient heart cath on 5/7, but doesn't think he can wait that long.   Avonne Boettcher, MD Cardiology

## 2024-02-02 NOTE — Consult Note (Incomplete)
 Cardiology Consultation:   Patient ID: BLAISE FALANGA MRN: [id number]; DOB: 02/26/1966  Admit date: 02/02/2024 Date of Consult: 02/03/2024  Primary Care Provider: Verlene Glimpse, NP [facility] Cardiologist: Randene Bustard, MD  [facility] Electrophysiologist:  None    Patient Profile:   TEVAUGHN SANTIZO is a 58 y.o. male with a hx of x of early onset coronary artery disease (stents to the LCx and RCA in 2010, patent by cath in 2014, low risk nuclear stress test 2020), smoking, hypercholesterolemia, paroxysmal atrial fibrillation, excessive alcohol use who is being seen today for the evaluation of chest pain at the request of hospital medicine.  History of Present Illness:   Mr. Buffkin reports that with walking 100 feet, he gets chest pain, pain in his right neck, head pain, and shortness of breath. His chest pain has been getting worse. He reports "I have not done anything this weekend except sitting in his chair".  He was scheduled to have an outpatient heart cath on 5/7, however given the severity of his chest pain and progressive nature, he decided to drive to the emergency room tonight to have it evaluated given expedited heart cath.  He is still smoking, but is down from 2 packs a day to 4-8 cigarettes per day. His wife still smokes 2 packs per day.  Recently he started having tooth decay, and had a tooth removed. He subsequently got a dry socket, and a gum ulcer, that is getting better. There are no plans for any future teeth procedure. His dentist is Dr Manson Seitz.   He is taking rosuvastatin  20mg  daily without issues, last dose 4/26 evening at 6pm. He is on rivaroxaban  for atrial fibrillation.   Past Medical History:  Diagnosis Date   Acid reflux    Anxiety    Arthritis    CAD S/P percutaneous coronary angioplasty 12/10   RCA/CFX DES   Chest pain 01/2013   Evaluate cardiac catheterization, no obstructive CAD   Diverticulitis    Dysrhythmia    OCC PALPITATION     High cholesterol    History of kidney stones    Hypertension     Past Surgical History:  Procedure Laterality Date   CHOLECYSTECTOMY N/A 08/04/2018   Procedure: LAPAROSCOPIC CHOLECYSTECTOMY;  Surgeon: Dorena Gander, MD;  Location: [facility] OR;  Service: General;  Laterality: N/A;   choleycystectomy  2020   COLONOSCOPY     CORONARY ANGIOPLASTY WITH STENT PLACEMENT  09/2009   Proximal RCA (95%-60%) - Cypher DES 3.0 mm but 33 mm --> 3.75 mm; pCFX 80%: PCI - XIENCE V. DES 2.75 mm x 15 mm --> 3.0 mm   EXTRACORPOREAL SHOCK WAVE LITHOTRIPSY Left 06/27/2020   Procedure: LEFT EXTRACORPOREAL SHOCK WAVE LITHOTRIPSY (ESWL);  Surgeon: Homero Luster, MD;  Location: [facility];  Service: Urology;  Laterality: Left;   LAPAROSCOPIC SIGMOID COLECTOMY N/A 06/05/2017   Procedure: SIGMOID COLECTOMY;  Surgeon: Dorena Gander, MD;  Location: [facility] OR;  Service: General;  Laterality: N/A;   LEFT HEART CATHETERIZATION WITH CORONARY ANGIOGRAM N/A 05/19/2012   Procedure: LEFT HEART CATHETERIZATION WITH CORONARY ANGIOGRAM;  Surgeon: Arleen Lacer, MD;  Location: [facility] CATH LAB;::  Cx & RCA stents patent. Moderate D1 & D2 as well as ostial AVG Cx.     LEFT HEART CATHETERIZATION WITH CORONARY ANGIOGRAM N/A 01/28/2013   Procedure: LEFT HEART CATHETERIZATION WITH CORONARY ANGIOGRAM;  Surgeon: Millicent Ally, MD;  Location: [facility] CATH LAB;  Service: Cardiovascular:  No evidence for restenosis  of either RCA or LCX. Mild LAD 20% narrowing and 60 - 70% smooth mid diagonal 2 stenosis.   NM MYOCAR PERF WALL MOTION  01/04/2011   Protocol:Bruce, post stress EF=69%, Exercise Cap 13 METS, attenuation defect in inferior region of myocardium, no sig. ischemia demonstrated   TENNIS ELBOW RELEASE/NIRSCHEL PROCEDURE Right 12/16/2019   Procedure: DEBRIDEMENT OF RIGHT ELBOW, EXCISION OSTEOPHYTES;  Surgeon: Micheline Ahr, MD;  Location: [facility];  Service: Orthopedics;  Laterality: Right;   TONSILLECTOMY  ~ 1973   TOTAL  HIP ARTHROPLASTY Left 09/23/2020   Procedure: TOTAL HIP ARTHROPLASTY;  Surgeon: Marlena Sima, MD;  Location: [facility] ORS;  Service: Orthopedics;  Laterality: Left;   TRANSTHORACIC ECHOCARDIOGRAM  05/13/2009   EF =>55%, mild mitral regurg, trace tricuspid regurg.   ULNAR NERVE TRANSPOSITION Right 12/16/2019   Procedure: ULNAR NERVE DECOMPRESSION/TRANSPOSITION;  Surgeon: Micheline Ahr, MD;  Location: [facility];  Service: Orthopedics;  Laterality: Right;     Home Medications:  Prior to Admission medications   Medication Sig Start Date End Date Taking? Authorizing Provider  acetaminophen  (TYLENOL ) 500 MG tablet Take 1,000 mg by mouth in the morning and at bedtime.   Yes [provider]  ALPRAZolam  (XANAX ) 0.5 MG tablet Take 0.5-1 mg by mouth at bedtime.   Yes [provider]  aspirin  EC 81 MG tablet Take 81 mg by mouth at bedtime.   Yes [provider]  isosorbide  mononitrate (IMDUR ) 30 MG 24 hr tablet Take 1 tablet (30 mg total) by mouth daily. 01/28/24  Yes Arleen Lacer, MD  lisinopril  (ZESTRIL ) 20 MG tablet Take 1 tablet (20 mg total) by mouth daily. 09/19/22  Yes Arleen Lacer, MD  metoprolol  succinate (TOPROL -XL) 25 MG 24 hr tablet Take 1 tablet (25 mg total) by mouth at bedtime. 01/28/24  Yes Arleen Lacer, MD  omeprazole (PRILOSEC) 40 MG capsule Take 40 mg by mouth at bedtime.    Yes [provider]  QUEtiapine (SEROQUEL) 50 MG tablet Take 50 mg by mouth at bedtime.   Yes [provider]  rivaroxaban  (XARELTO ) 20 MG TABS tablet Take 1 tablet (20 mg total) by mouth daily with supper. 01/28/24  Yes Arleen Lacer, MD  rosuvastatin  (CRESTOR ) 20 MG tablet TAKE 1 TABLET BY MOUTH EVERY DAY 12/05/23  Yes Arleen Lacer, MD  metoprolol  tartrate (LOPRESSOR ) 25 MG tablet TAKE 1/2 TABLET TWICE A DAY BY MOUTH AS NEEDED 01/28/24   Arleen Lacer, MD    Inpatient Medications: Scheduled Meds:  aspirin  EC  81 mg Oral QHS   isosorbide   mononitrate  30 mg Oral Daily   pantoprazole   40 mg Oral Daily   rosuvastatin   20 mg Oral Daily   sodium chloride  flush  3 mL Intravenous Q12H   Continuous Infusions:  sodium chloride      heparin  950 Units/hr (02/02/24 2249)   PRN Meds: sodium chloride , acetaminophen  **OR** acetaminophen , prochlorperazine, sodium chloride  flush  Allergies:    Allergies  Allergen Reactions   Nsaids Other (See Comments)    Causes irregular heart beat    Social History:   Social History   Socioeconomic History   Marital status: Married    Spouse name: Not on file   Number of children: Not on file   Years of education: Not on file   Highest education level: Not on file  Occupational History   Occupation: Unemployed  Tobacco Use   Smoking status: Every Day  Current packs/day: 0.50    Average packs/day: 0.5 packs/day for 30.0 years (15.0 ttl pk-yrs)    Types: Cigarettes   Smokeless tobacco: Never  Vaping Use   Vaping status: Never Used  Substance and Sexual Activity   Alcohol use: Yes    Alcohol/week: 8.0 - 10.0 standard drinks of alcohol    Types: 8 - 10 Shots of liquor per week    Comment: twice a week   Drug use: Not Currently    Frequency: 7.0 times per week    Types: Marijuana, LSD    Comment: 01/28/2013 last drug use ">10 yr ago"; had reported marijuana use several times a week   Sexual activity: Not Currently    Birth control/protection: None  Other Topics Concern   Not on file  Social History Narrative   Single - but has reunited with his "ex-wife", 2 children, 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   Is active, but with a re-established spousal relationship, is eating a lot more "good - home cooked meals. -->  Often high in salt.      He reports drinking a 1/5 of liquor weekly. Takes " swigs form the bottle, sometimes 7-8 in one night. Reports that he does not do this nightly. He does smoke. He snores loudly. He drinks moderate  caffeine   Social Drivers of Corporate investment banker Strain: Not on file  Food Insecurity: Not on file  Transportation Needs: Not on file  Physical Activity: Not on file  Stress: Not on file  Social Connections: Unknown (02/18/2022)   Received from [facility], [facility]   Social Network    Social Network: Not on file  Intimate Partner Violence: Unknown (01/10/2022)   Received from [employer], [facility]   HITS    Physically Hurt: Not on file    Insult or Talk Down To: Not on file    Threaten Physical Harm: Not on file    Scream or Curse: Not on file    Family History:    Family History  Problem Relation Age of Onset   CAD Father 75       CABG     ROS:   Review of Systems: [y] = yes, [ ]  = no      General: Weight gain [ ] ; Weight loss [ ] ; Anorexia [ ] ; Fatigue [ ] ; Fever [ ] ; Chills [ ] ; Weakness [ ]    Cardiac: Chest pain/pressure [y]; Resting SOB [ ] ; Exertional SOB Davis.Dad ]; Orthopnea [ ] ; Pedal Edema [ ] ; Palpitations [ ] ; Syncope [ ] ; Presyncope [ ] ; Paroxysmal nocturnal dyspnea [ ]    Pulmonary: Cough [ ] ; Wheezing [ ] ; Hemoptysis [ ] ; Sputum [ ] ; Snoring [ ]    GI: Vomiting [ ] ; Dysphagia [ ] ; Melena [ ] ; Hematochezia [ ] ; Heartburn [ ] ; Abdominal pain [ ] ; Constipation [ ] ; Diarrhea [ ] ; BRBPR [ ]    GU: Hematuria [ ] ; Dysuria [ ] ; Nocturia [ ]  Vascular: Pain in legs with walking [ ] ; Pain in feet with lying flat [ ] ; Non-healing sores [ ] ; Stroke [ ] ; TIA [ ] ; Slurred speech [ ] ;   Neuro: Headaches [ ] ; Vertigo [ ] ; Seizures [ ] ; Paresthesias [ ] ;Blurred vision [ ] ; Diplopia [ ] ; Vision changes [ ]    Ortho/Skin: Arthritis [ ] ; Joint pain [ ] ; Muscle pain [ ] ; Joint swelling [ ] ; Back Pain [ ] ; Rash [ ]   Psych: Depression [ ] ; Anxiety [ ]    Heme: Bleeding problems [ ] ; Clotting disorders [ ] ; Anemia [ ]    Endocrine: Diabetes [ ] ; Thyroid  dysfunction [ ]    Physical Exam/Data:   Vitals:   02/02/24 2100 02/02/24 2130 02/02/24 2215 02/02/24 2235  BP: 128/81  134/72 125/75 136/80  Pulse: (!) 55 (!) 54 (!) 49 (!) 56  Resp: 18 17 (!) 32 (!) 25  Temp:    98.4 F (36.9 C)  TempSrc:    Oral  SpO2: 100% 100% 100% 100%  Weight:      Height:       No intake or output data in the 24 hours ending 02/03/24 0036    02/02/2024    7:32 PM 01/28/2024    8:21 AM 09/18/2022   10:30 AM  Last 3 Weights  Weight (lbs) 188 lb 188 lb 9.6 oz 175 lb  Weight (kg) 85.276 kg 85.548 kg 79.379 kg     Body mass index is 32.27 kg/m.  General:  Well nourished, well developed, in no acute distress HEENT: normal Lymph: no adenopathy Neck: no JVD Endocrine:  No thryomegaly Vascular: No carotid bruits; FA pulses 2+ bilaterally without bruits  Cardiac:  normal S1, S2; bradycardic with RR; no murmur  Lungs:  clear to auscultation bilaterally, no wheezing, rhonchi or rales  Abd: soft, nontender, no hepatomegaly  Ext: no edema Musculoskeletal:  No deformities, BUE and BLE strength normal and equal Skin: warm and dry  Neuro:  CNs 2-12 intact, no focal abnormalities noted Psych:  Normal affect   EKG:  The EKG was personally reviewed and demonstrates: Normal sinus rhythm with ventricular rate 75 bpm  Relevant CV Studies: IMPRESSIONS     1. Left ventricular ejection fraction, by estimation, is 55 to 60%. The  left ventricle has normal function. The left ventricle has no regional  wall motion abnormalities. Left ventricular diastolic parameters are  indeterminate.   2. Right ventricular systolic function is normal. The right ventricular  size is normal.   3. The mitral valve is grossly normal. Mild to moderate mitral valve  regurgitation.   4. The aortic valve is normal in structure. Aortic valve regurgitation is  not visualized.   5. The inferior vena cava is normal in size with <50% respiratory  variability, suggesting right atrial pressure of 8 mmHg.   Laboratory Data:  High Sensitivity Troponin:   Recent Labs  Lab 02/02/24 1937  TROPONINIHS 9      Chemistry Recent Labs  Lab 01/28/24 0936 02/02/24 1937  NA 138 141  K 5.3* 3.6  CL 102 107  CO2 22 23  GLUCOSE 89 138*  BUN 23 9  CREATININE 1.60* 1.12  CALCIUM  9.5 9.0  GFRNONAA  --  >60  ANIONGAP  --  11    Recent Labs  Lab 01/28/24 0936  PROT 5.9*  ALBUMIN 4.2  AST 18  ALT 29  ALKPHOS 108  BILITOT 0.4   Hematology Recent Labs  Lab 01/28/24 0936 02/02/24 1937  WBC 11.5* 7.3  RBC 4.41 4.33  HGB 13.9 13.8  HCT 41.8 41.3  MCV 95 95.4  MCH 31.5 31.9  MCHC 33.3 33.4  RDW 12.9 11.9  PLT 283 221   BNPNo results for input(s): "BNP", "PROBNP" in the last 168 hours.  DDimer No results for input(s): "DDIMER" in the last 168 hours.  Radiology/Studies:  DG Chest 2 View Result Date: 02/02/2024 CLINICAL DATA:  Chronic chest pain worsening today EXAM: CHEST -  2 VIEW COMPARISON:  09/12/2022 FINDINGS: Frontal and lateral views of the chest demonstrate an unremarkable cardiac silhouette. Linear densities at the lung bases consistent with subsegmental atelectasis or scarring. No acute airspace disease, effusion, or pneumothorax. No acute bony abnormalities. IMPRESSION: 1. Bibasilar subsegmental atelectasis or scarring. No acute airspace disease. Electronically Signed   By: Bobbye Burrow M.D.   On: 02/02/2024 20:48      TIMI Risk Score for Unstable Angina or Non-ST Elevation MI:   The patient's TIMI risk score is 4, which indicates a 20% risk of all cause mortality, new or recurrent myocardial infarction or need for urgent revascularization in the next 14 days.   Assessment and Plan:   JEHU SERIGHT is a 58 y.o. male with a hx of x of early onset coronary artery disease (stents to the LCx and RCA in 2010, patent by cath in 2014, low risk nuclear stress test 2020), smoking, hypercholesterolemia, paroxysmal atrial fibrillation, excessive alcohol use who is being seen today for the evaluation of chest pain at the request of hospital medicine.  Unstable  Angina CAD HLD Presenting with severe progressive angina, with debilitating chest pain and shortness of breath even with walking 100 feet.  He is now limited to sitting and only walking to the bathroom.  His symptoms sound typical, with central chest pain radiating to his right neck.  He has a history of early onset CAD, and a strong smoking history.  His troponins ruled out and were negative, and he had a normal EKG.  She was scheduled for outpatient heart catheterization on 5/7, however he is unable to tolerate this chest pain at home, and would benefit from expedited catheterization. - N.p.o. midnight except medications - Coronary angiography 4/28 - Continue home rosuvastatin  20mg  and aspirin  - Hold home Xarelto  (last dose 4/26 evening), heparin  drip - Continue home Imdur  for chest pain, as needed nitro tabs - LP(a), recent lipids showed LDL was controlled  Paroxysmal Atrial Fibrillation -Holding home Xarelto  prior to cath  Tobacco Use  Pt has decreased his cigarette use from 2 packs/day to now 6 cigarettes/day.  Wife at home still smokes 2 packs/day, is not interested in quitting - Continue smoking cessation counseling      For questions or updates, please contact [facility] Please consult [email or web address] for contact info under     Signed, Chauncey Cora, MD  02/03/2024 12:36 AM   ATTENDING ATTESTATION  I have seen, examined and evaluated the patient this morning after initial consultation by Dr. Cain Castillo.  After reviewing all the available data and chart, we discussed the patients laboratory, study & physical findings as well as symptoms in detail.  I agree with his findings, examination as well as impression recommendations as per our discussion.    Attending adjustments noted in italics.   Patient is well-known to me-I just saw him for the first time in clinic on 01/28/2024 after a long hiatus without being seen.  When I saw him he was presenting with symptoms  concerning for progressive angina with plans for cardiac catheterization next week.  Unfortunately he now presents with progression from class III to class IV angina with angina occurring with minimal exertion.  He says from a dental standpoint his gums are still a bit sore but the socket is is no longer tender and it is painful.  No plans for oral surgery.  His last dose of Xarelto  was on the 26th and he is now on IV heparin  for  ACS presentation with plans for cardiac catheterization today.  He is on Toprol  25 which we cannot titrate further based on bradycardia.  We started on Imdur  during his last visit.  Anticipate that we can probably add ARB during his hospitalization depending on pressures.  We will take the patient over to the cardiology service after discussion with Dr. Sheila Dent.  Informed Consent   Shared Decision Making/Informed Consent The risks [stroke (1 in 1000), death (1 in 1000), kidney failure [usually temporary] (1 in 500), bleeding (1 in 200), allergic reaction [possibly serious] (1 in 200)], benefits (diagnostic support and management of coronary artery disease) and alternatives of a cardiac catheterization were discussed in detail with Mr. Alford and he is willing to proceed.        Arleen Lacer, MD, MS Randene Bustard, M.D., M.S. Interventional Cardiologist  [facility]  Pager # [phone] Phone # [phone] [address] [place], [place] [address]

## 2024-02-02 NOTE — H&P (View-Only) (Signed)
 Cardiology Consultation:   Patient ID: Eric Savage MRN: [id number]; DOB: 02/26/1966  Admit date: 02/02/2024 Date of Consult: 02/03/2024  Primary Care Provider: Verlene Glimpse, NP [facility] Cardiologist: Randene Bustard, MD  [facility] Electrophysiologist:  None    Patient Profile:   Eric Savage is a 58 y.o. male with a hx of x of early onset coronary artery disease (stents to the LCx and RCA in 2010, patent by cath in 2014, low risk nuclear stress test 2020), smoking, hypercholesterolemia, paroxysmal atrial fibrillation, excessive alcohol use who is being seen today for the evaluation of chest pain at the request of hospital medicine.  History of Present Illness:   Eric Savage reports that with walking 100 feet, he gets chest pain, pain in his right neck, head pain, and shortness of breath. His chest pain has been getting worse. He reports "I have not done anything this weekend except sitting in his chair".  He was scheduled to have an outpatient heart cath on 5/7, however given the severity of his chest pain and progressive nature, he decided to drive to the emergency room tonight to have it evaluated given expedited heart cath.  He is still smoking, but is down from 2 packs a day to 4-8 cigarettes per day. His wife still smokes 2 packs per day.  Recently he started having tooth decay, and had a tooth removed. He subsequently got a dry socket, and a gum ulcer, that is getting better. There are no plans for any future teeth procedure. His dentist is Dr Manson Seitz.   He is taking rosuvastatin  20mg  daily without issues, last dose 4/26 evening at 6pm. He is on rivaroxaban  for atrial fibrillation.   Past Medical History:  Diagnosis Date   Acid reflux    Anxiety    Arthritis    CAD S/P percutaneous coronary angioplasty 12/10   RCA/CFX DES   Chest pain 01/2013   Evaluate cardiac catheterization, no obstructive CAD   Diverticulitis    Dysrhythmia    OCC PALPITATION     High cholesterol    History of kidney stones    Hypertension     Past Surgical History:  Procedure Laterality Date   CHOLECYSTECTOMY N/A 08/04/2018   Procedure: LAPAROSCOPIC CHOLECYSTECTOMY;  Surgeon: Dorena Gander, MD;  Location: [facility] OR;  Service: General;  Laterality: N/A;   choleycystectomy  2020   COLONOSCOPY     CORONARY ANGIOPLASTY WITH STENT PLACEMENT  09/2009   Proximal RCA (95%-60%) - Cypher DES 3.0 mm but 33 mm --> 3.75 mm; pCFX 80%: PCI - XIENCE V. DES 2.75 mm x 15 mm --> 3.0 mm   EXTRACORPOREAL SHOCK WAVE LITHOTRIPSY Left 06/27/2020   Procedure: LEFT EXTRACORPOREAL SHOCK WAVE LITHOTRIPSY (ESWL);  Surgeon: Homero Luster, MD;  Location: [facility];  Service: Urology;  Laterality: Left;   LAPAROSCOPIC SIGMOID COLECTOMY N/A 06/05/2017   Procedure: SIGMOID COLECTOMY;  Surgeon: Dorena Gander, MD;  Location: [facility] OR;  Service: General;  Laterality: N/A;   LEFT HEART CATHETERIZATION WITH CORONARY ANGIOGRAM N/A 05/19/2012   Procedure: LEFT HEART CATHETERIZATION WITH CORONARY ANGIOGRAM;  Surgeon: Arleen Lacer, MD;  Location: [facility] CATH LAB;::  Cx & RCA stents patent. Moderate D1 & D2 as well as ostial AVG Cx.     LEFT HEART CATHETERIZATION WITH CORONARY ANGIOGRAM N/A 01/28/2013   Procedure: LEFT HEART CATHETERIZATION WITH CORONARY ANGIOGRAM;  Surgeon: Millicent Ally, MD;  Location: [facility] CATH LAB;  Service: Cardiovascular:  No evidence for restenosis  of either RCA or LCX. Mild LAD 20% narrowing and 60 - 70% smooth mid diagonal 2 stenosis.   NM MYOCAR PERF WALL MOTION  01/04/2011   Protocol:Bruce, post stress EF=69%, Exercise Cap 13 METS, attenuation defect in inferior region of myocardium, no sig. ischemia demonstrated   TENNIS ELBOW RELEASE/NIRSCHEL PROCEDURE Right 12/16/2019   Procedure: DEBRIDEMENT OF RIGHT ELBOW, EXCISION OSTEOPHYTES;  Surgeon: Micheline Ahr, MD;  Location: [facility];  Service: Orthopedics;  Laterality: Right;   TONSILLECTOMY  ~ 1973   TOTAL  HIP ARTHROPLASTY Left 09/23/2020   Procedure: TOTAL HIP ARTHROPLASTY;  Surgeon: Marlena Sima, MD;  Location: [facility] ORS;  Service: Orthopedics;  Laterality: Left;   TRANSTHORACIC ECHOCARDIOGRAM  05/13/2009   EF =>55%, mild mitral regurg, trace tricuspid regurg.   ULNAR NERVE TRANSPOSITION Right 12/16/2019   Procedure: ULNAR NERVE DECOMPRESSION/TRANSPOSITION;  Surgeon: Micheline Ahr, MD;  Location: [facility];  Service: Orthopedics;  Laterality: Right;     Home Medications:  Prior to Admission medications   Medication Sig Start Date End Date Taking? Authorizing Provider  acetaminophen  (TYLENOL ) 500 MG tablet Take 1,000 mg by mouth in the morning and at bedtime.   Yes [provider]  ALPRAZolam  (XANAX ) 0.5 MG tablet Take 0.5-1 mg by mouth at bedtime.   Yes [provider]  aspirin  EC 81 MG tablet Take 81 mg by mouth at bedtime.   Yes [provider]  isosorbide  mononitrate (IMDUR ) 30 MG 24 hr tablet Take 1 tablet (30 mg total) by mouth daily. 01/28/24  Yes Arleen Lacer, MD  lisinopril  (ZESTRIL ) 20 MG tablet Take 1 tablet (20 mg total) by mouth daily. 09/19/22  Yes Arleen Lacer, MD  metoprolol  succinate (TOPROL -XL) 25 MG 24 hr tablet Take 1 tablet (25 mg total) by mouth at bedtime. 01/28/24  Yes Arleen Lacer, MD  omeprazole (PRILOSEC) 40 MG capsule Take 40 mg by mouth at bedtime.    Yes [provider]  QUEtiapine (SEROQUEL) 50 MG tablet Take 50 mg by mouth at bedtime.   Yes [provider]  rivaroxaban  (XARELTO ) 20 MG TABS tablet Take 1 tablet (20 mg total) by mouth daily with supper. 01/28/24  Yes Arleen Lacer, MD  rosuvastatin  (CRESTOR ) 20 MG tablet TAKE 1 TABLET BY MOUTH EVERY DAY 12/05/23  Yes Arleen Lacer, MD  metoprolol  tartrate (LOPRESSOR ) 25 MG tablet TAKE 1/2 TABLET TWICE A DAY BY MOUTH AS NEEDED 01/28/24   Arleen Lacer, MD    Inpatient Medications: Scheduled Meds:  aspirin  EC  81 mg Oral QHS   isosorbide   mononitrate  30 mg Oral Daily   pantoprazole   40 mg Oral Daily   rosuvastatin   20 mg Oral Daily   sodium chloride  flush  3 mL Intravenous Q12H   Continuous Infusions:  sodium chloride      heparin  950 Units/hr (02/02/24 2249)   PRN Meds: sodium chloride , acetaminophen  **OR** acetaminophen , prochlorperazine, sodium chloride  flush  Allergies:    Allergies  Allergen Reactions   Nsaids Other (See Comments)    Causes irregular heart beat    Social History:   Social History   Socioeconomic History   Marital status: Married    Spouse name: Not on file   Number of children: Not on file   Years of education: Not on file   Highest education level: Not on file  Occupational History   Occupation: Unemployed  Tobacco Use   Smoking status: Every Day  Current packs/day: 0.50    Average packs/day: 0.5 packs/day for 30.0 years (15.0 ttl pk-yrs)    Types: Cigarettes   Smokeless tobacco: Never  Vaping Use   Vaping status: Never Used  Substance and Sexual Activity   Alcohol use: Yes    Alcohol/week: 8.0 - 10.0 standard drinks of alcohol    Types: 8 - 10 Shots of liquor per week    Comment: twice a week   Drug use: Not Currently    Frequency: 7.0 times per week    Types: Marijuana, LSD    Comment: 01/28/2013 last drug use ">10 yr ago"; had reported marijuana use several times a week   Sexual activity: Not Currently    Birth control/protection: None  Other Topics Concern   Not on file  Social History Narrative   Single - but has reunited with his "ex-wife", 2 children, 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   Is active, but with a re-established spousal relationship, is eating a lot more "good - home cooked meals. -->  Often high in salt.      He reports drinking a 1/5 of liquor weekly. Takes " swigs form the bottle, sometimes 7-8 in one night. Reports that he does not do this nightly. He does smoke. He snores loudly. He drinks moderate  caffeine   Social Drivers of Corporate investment banker Strain: Not on file  Food Insecurity: Not on file  Transportation Needs: Not on file  Physical Activity: Not on file  Stress: Not on file  Social Connections: Unknown (02/18/2022)   Received from [facility], [facility]   Social Network    Social Network: Not on file  Intimate Partner Violence: Unknown (01/10/2022)   Received from [employer], [facility]   HITS    Physically Hurt: Not on file    Insult or Talk Down To: Not on file    Threaten Physical Harm: Not on file    Scream or Curse: Not on file    Family History:    Family History  Problem Relation Age of Onset   CAD Father 75       CABG     ROS:   Review of Systems: [y] = yes, [ ]  = no      General: Weight gain [ ] ; Weight loss [ ] ; Anorexia [ ] ; Fatigue [ ] ; Fever [ ] ; Chills [ ] ; Weakness [ ]    Cardiac: Chest pain/pressure [y]; Resting SOB [ ] ; Exertional SOB Davis.Dad ]; Orthopnea [ ] ; Pedal Edema [ ] ; Palpitations [ ] ; Syncope [ ] ; Presyncope [ ] ; Paroxysmal nocturnal dyspnea [ ]    Pulmonary: Cough [ ] ; Wheezing [ ] ; Hemoptysis [ ] ; Sputum [ ] ; Snoring [ ]    GI: Vomiting [ ] ; Dysphagia [ ] ; Melena [ ] ; Hematochezia [ ] ; Heartburn [ ] ; Abdominal pain [ ] ; Constipation [ ] ; Diarrhea [ ] ; BRBPR [ ]    GU: Hematuria [ ] ; Dysuria [ ] ; Nocturia [ ]  Vascular: Pain in legs with walking [ ] ; Pain in feet with lying flat [ ] ; Non-healing sores [ ] ; Stroke [ ] ; TIA [ ] ; Slurred speech [ ] ;   Neuro: Headaches [ ] ; Vertigo [ ] ; Seizures [ ] ; Paresthesias [ ] ;Blurred vision [ ] ; Diplopia [ ] ; Vision changes [ ]    Ortho/Skin: Arthritis [ ] ; Joint pain [ ] ; Muscle pain [ ] ; Joint swelling [ ] ; Back Pain [ ] ; Rash [ ]   Psych: Depression [ ] ; Anxiety [ ]    Heme: Bleeding problems [ ] ; Clotting disorders [ ] ; Anemia [ ]    Endocrine: Diabetes [ ] ; Thyroid  dysfunction [ ]    Physical Exam/Data:   Vitals:   02/02/24 2100 02/02/24 2130 02/02/24 2215 02/02/24 2235  BP: 128/81  134/72 125/75 136/80  Pulse: (!) 55 (!) 54 (!) 49 (!) 56  Resp: 18 17 (!) 32 (!) 25  Temp:    98.4 F (36.9 C)  TempSrc:    Oral  SpO2: 100% 100% 100% 100%  Weight:      Height:       No intake or output data in the 24 hours ending 02/03/24 0036    02/02/2024    7:32 PM 01/28/2024    8:21 AM 09/18/2022   10:30 AM  Last 3 Weights  Weight (lbs) 188 lb 188 lb 9.6 oz 175 lb  Weight (kg) 85.276 kg 85.548 kg 79.379 kg     Body mass index is 32.27 kg/m.  General:  Well nourished, well developed, in no acute distress HEENT: normal Lymph: no adenopathy Neck: no JVD Endocrine:  No thryomegaly Vascular: No carotid bruits; FA pulses 2+ bilaterally without bruits  Cardiac:  normal S1, S2; bradycardic with RR; no murmur  Lungs:  clear to auscultation bilaterally, no wheezing, rhonchi or rales  Abd: soft, nontender, no hepatomegaly  Ext: no edema Musculoskeletal:  No deformities, BUE and BLE strength normal and equal Skin: warm and dry  Neuro:  CNs 2-12 intact, no focal abnormalities noted Psych:  Normal affect   EKG:  The EKG was personally reviewed and demonstrates: Normal sinus rhythm with ventricular rate 75 bpm  Relevant CV Studies: IMPRESSIONS     1. Left ventricular ejection fraction, by estimation, is 55 to 60%. The  left ventricle has normal function. The left ventricle has no regional  wall motion abnormalities. Left ventricular diastolic parameters are  indeterminate.   2. Right ventricular systolic function is normal. The right ventricular  size is normal.   3. The mitral valve is grossly normal. Mild to moderate mitral valve  regurgitation.   4. The aortic valve is normal in structure. Aortic valve regurgitation is  not visualized.   5. The inferior vena cava is normal in size with <50% respiratory  variability, suggesting right atrial pressure of 8 mmHg.   Laboratory Data:  High Sensitivity Troponin:   Recent Labs  Lab 02/02/24 1937  TROPONINIHS 9      Chemistry Recent Labs  Lab 01/28/24 0936 02/02/24 1937  NA 138 141  K 5.3* 3.6  CL 102 107  CO2 22 23  GLUCOSE 89 138*  BUN 23 9  CREATININE 1.60* 1.12  CALCIUM  9.5 9.0  GFRNONAA  --  >60  ANIONGAP  --  11    Recent Labs  Lab 01/28/24 0936  PROT 5.9*  ALBUMIN 4.2  AST 18  ALT 29  ALKPHOS 108  BILITOT 0.4   Hematology Recent Labs  Lab 01/28/24 0936 02/02/24 1937  WBC 11.5* 7.3  RBC 4.41 4.33  HGB 13.9 13.8  HCT 41.8 41.3  MCV 95 95.4  MCH 31.5 31.9  MCHC 33.3 33.4  RDW 12.9 11.9  PLT 283 221   BNPNo results for input(s): "BNP", "PROBNP" in the last 168 hours.  DDimer No results for input(s): "DDIMER" in the last 168 hours.  Radiology/Studies:  DG Chest 2 View Result Date: 02/02/2024 CLINICAL DATA:  Chronic chest pain worsening today EXAM: CHEST -  2 VIEW COMPARISON:  09/12/2022 FINDINGS: Frontal and lateral views of the chest demonstrate an unremarkable cardiac silhouette. Linear densities at the lung bases consistent with subsegmental atelectasis or scarring. No acute airspace disease, effusion, or pneumothorax. No acute bony abnormalities. IMPRESSION: 1. Bibasilar subsegmental atelectasis or scarring. No acute airspace disease. Electronically Signed   By: Bobbye Burrow M.D.   On: 02/02/2024 20:48      TIMI Risk Score for Unstable Angina or Non-ST Elevation MI:   The patient's TIMI risk score is 4, which indicates a 20% risk of all cause mortality, new or recurrent myocardial infarction or need for urgent revascularization in the next 14 days.   Assessment and Plan:   Eric Savage is a 58 y.o. male with a hx of x of early onset coronary artery disease (stents to the LCx and RCA in 2010, patent by cath in 2014, low risk nuclear stress test 2020), smoking, hypercholesterolemia, paroxysmal atrial fibrillation, excessive alcohol use who is being seen today for the evaluation of chest pain at the request of hospital medicine.  Unstable  Angina CAD HLD Presenting with severe progressive angina, with debilitating chest pain and shortness of breath even with walking 100 feet.  He is now limited to sitting and only walking to the bathroom.  His symptoms sound typical, with central chest pain radiating to his right neck.  He has a history of early onset CAD, and a strong smoking history.  His troponins ruled out and were negative, and he had a normal EKG.  She was scheduled for outpatient heart catheterization on 5/7, however he is unable to tolerate this chest pain at home, and would benefit from expedited catheterization. - N.p.o. midnight except medications - Coronary angiography 4/28 - Continue home rosuvastatin  20mg  and aspirin  - Hold home Xarelto  (last dose 4/26 evening), heparin  drip - Continue home Imdur  for chest pain, as needed nitro tabs - LP(a), recent lipids showed LDL was controlled  Paroxysmal Atrial Fibrillation -Holding home Xarelto  prior to cath  Tobacco Use  Pt has decreased his cigarette use from 2 packs/day to now 6 cigarettes/day.  Wife at home still smokes 2 packs/day, is not interested in quitting - Continue smoking cessation counseling      For questions or updates, please contact [facility] Please consult [email or web address] for contact info under     Signed, Chauncey Cora, MD  02/03/2024 12:36 AM   ATTENDING ATTESTATION  I have seen, examined and evaluated the patient this morning after initial consultation by Dr. Cain Castillo.  After reviewing all the available data and chart, we discussed the patients laboratory, study & physical findings as well as symptoms in detail.  I agree with his findings, examination as well as impression recommendations as per our discussion.    Attending adjustments noted in italics.   Patient is well-known to me-I just saw him for the first time in clinic on 01/28/2024 after a long hiatus without being seen.  When I saw him he was presenting with symptoms  concerning for progressive angina with plans for cardiac catheterization next week.  Unfortunately he now presents with progression from class III to class IV angina with angina occurring with minimal exertion.  He says from a dental standpoint his gums are still a bit sore but the socket is is no longer tender and it is painful.  No plans for oral surgery.  His last dose of Xarelto  was on the 26th and he is now on IV heparin  for  ACS presentation with plans for cardiac catheterization today.  He is on Toprol  25 which we cannot titrate further based on bradycardia.  We started on Imdur  during his last visit.  Anticipate that we can probably add ARB during his hospitalization depending on pressures.  We will take the patient over to the cardiology service after discussion with Dr. Sheila Dent.  Informed Consent   Shared Decision Making/Informed Consent The risks [stroke (1 in 1000), death (1 in 1000), kidney failure [usually temporary] (1 in 500), bleeding (1 in 200), allergic reaction [possibly serious] (1 in 200)], benefits (diagnostic support and management of coronary artery disease) and alternatives of a cardiac catheterization were discussed in detail with Mr. Alford and he is willing to proceed.        Arleen Lacer, MD, MS Randene Bustard, M.D., M.S. Interventional Cardiologist  [facility]  Pager # [phone] Phone # [phone] [address] [place], [place] [address]

## 2024-02-02 NOTE — H&P (Signed)
 History and Physical    Eric Savage ZOX:[id number] DOB: 26-May-1966 DOA: 02/02/2024  PCP: Verlene Glimpse, NP   Patient coming from: Home   Chief Complaint:  Chief Complaint  Patient presents with   Chest Pain   ED TRIAGE note:  Patient coming to ED for evaluation of chest pain.  Reports pain has been intermittent x 6 months.  Has a schedule cardiac cath scheduled for 02/12/2024.  Reports "I don't think I am going to be able to make it to then."  C/o central chest pain that radiates into neck and head.  Called cardiologist and was told to come to ED for further evaluation.  Having SHOB and HA    HPI:  Eric Savage is a 58 y.o. male with medical history significant of history of CAD s/p stent placement, hyperlipidemia, essential hypertension, chronic bradycardia, paroxysmal atrial  fibrillation, unstable angina and chronic smoking cigarette has been presented to emergency department complaining of significant progressively worsening chest pain.  Patient reported that he has been scheduled for heart cath 02/12/2024 but due to progressively worsening chest pain call cardiology clinic and referred him to emergency department for evaluation. Patient reported that she noticed having mid substernal chest pain/pressure-like sensation which starts usually after walking more than 100 feet.  Chest pain states for few minutes and improves with rest and exacerbated with walking or movement.  Chest pain radiates to the neck right-sided of the shoulder, arm and travels up to his head.  He reported associated dyspnea.  Denies any nausea and dizziness.  Reporting chest pain interfering with his work life and limiting activity.  Reported he has been just being started on Imdur  from Friday and took it on Friday and Saturday.   No other complaint at this time.  ED Course:  At presentation to ED heart rate in between 54 to 78, blood pressure within good range.  O2 sat 100% room air. First troponin is  9.  Pending second troponin level. CBC and BMP unremarkable. EKG showing normal sinus rhythm heart rate 75.  There is no ST anterior abnormality.   Significant labs in the ED: Lab Orders         Basic metabolic panel         CBC         Urinalysis, Routine w reflex microscopic -Urine, Clean Catch         HIV Antibody (routine testing w rflx)         Comprehensive metabolic panel         APTT         APTT         Heparin  level (unfractionated)         CBC         Heparin  level (unfractionated)       Review of Systems:  Review of Systems  Constitutional:  Negative for malaise/fatigue and weight loss.  Respiratory:  Negative for cough and shortness of breath.   Cardiovascular:  Positive for chest pain and orthopnea. Negative for palpitations, claudication, leg swelling and PND.  Gastrointestinal:  Negative for heartburn and nausea.  Musculoskeletal:  Negative for back pain, myalgias and neck pain.  Neurological:  Positive for headaches. Negative for dizziness and tingling.  Psychiatric/Behavioral:  The patient is not nervous/anxious.     Past Medical History:  Diagnosis Date   Acid reflux    Anxiety    Arthritis    CAD S/P percutaneous coronary angioplasty 12/10  RCA/CFX DES   Chest pain 01/2013   Evaluate cardiac catheterization, no obstructive CAD   Diverticulitis    Dysrhythmia    OCC PALPITATION    High cholesterol    History of kidney stones    Hypertension     Past Surgical History:  Procedure Laterality Date   CHOLECYSTECTOMY N/A 08/04/2018   Procedure: LAPAROSCOPIC CHOLECYSTECTOMY;  Surgeon: Dorena Gander, MD;  Location: [facility] OR;  Service: General;  Laterality: N/A;   choleycystectomy  2020   COLONOSCOPY     CORONARY ANGIOPLASTY WITH STENT PLACEMENT  09/2009   Proximal RCA (95%-60%) - Cypher DES 3.0 mm but 33 mm --> 3.75 mm; pCFX 80%: PCI - XIENCE V. DES 2.75 mm x 15 mm --> 3.0 mm   EXTRACORPOREAL SHOCK WAVE LITHOTRIPSY Left 06/27/2020   Procedure: LEFT  EXTRACORPOREAL SHOCK WAVE LITHOTRIPSY (ESWL);  Surgeon: Homero Luster, MD;  Location: [facility];  Service: Urology;  Laterality: Left;   LAPAROSCOPIC SIGMOID COLECTOMY N/A 06/05/2017   Procedure: SIGMOID COLECTOMY;  Surgeon: Dorena Gander, MD;  Location: [facility] OR;  Service: General;  Laterality: N/A;   LEFT HEART CATHETERIZATION WITH CORONARY ANGIOGRAM N/A 05/19/2012   Procedure: LEFT HEART CATHETERIZATION WITH CORONARY ANGIOGRAM;  Surgeon: Arleen Lacer, MD;  Location: [facility] CATH LAB;::  Cx & RCA stents patent. Moderate D1 & D2 as well as ostial AVG Cx.     LEFT HEART CATHETERIZATION WITH CORONARY ANGIOGRAM N/A 01/28/2013   Procedure: LEFT HEART CATHETERIZATION WITH CORONARY ANGIOGRAM;  Surgeon: Millicent Ally, MD;  Location: [facility] CATH LAB;  Service: Cardiovascular:  No evidence for restenosis of either RCA or LCX. Mild LAD 20% narrowing and 60 - 70% smooth mid diagonal 2 stenosis.   NM MYOCAR PERF WALL MOTION  01/04/2011   Protocol:Bruce, post stress EF=69%, Exercise Cap 13 METS, attenuation defect in inferior region of myocardium, no sig. ischemia demonstrated   TENNIS ELBOW RELEASE/NIRSCHEL PROCEDURE Right 12/16/2019   Procedure: DEBRIDEMENT OF RIGHT ELBOW, EXCISION OSTEOPHYTES;  Surgeon: Micheline Ahr, MD;  Location: [facility];  Service: Orthopedics;  Laterality: Right;   TONSILLECTOMY  ~ 1973   TOTAL HIP ARTHROPLASTY Left 09/23/2020   Procedure: TOTAL HIP ARTHROPLASTY;  Surgeon: Marlena Sima, MD;  Location: [facility] ORS;  Service: Orthopedics;  Laterality: Left;   TRANSTHORACIC ECHOCARDIOGRAM  05/13/2009   EF =>55%, mild mitral regurg, trace tricuspid regurg.   ULNAR NERVE TRANSPOSITION Right 12/16/2019   Procedure: ULNAR NERVE DECOMPRESSION/TRANSPOSITION;  Surgeon: Micheline Ahr, MD;  Location: [facility];  Service: Orthopedics;  Laterality: Right;     reports that he has been smoking cigarettes. He has a 15 pack-year smoking history. He has never used  smokeless tobacco. He reports current alcohol use of about 8.0 - 10.0 standard drinks of alcohol per week. He reports that he does not currently use drugs after having used the following drugs: Marijuana and LSD. Frequency: 7.00 times per week.  Allergies  Allergen Reactions   Other Other (See Comments)   Nsaids Other (See Comments)    Causes irregular heart beat    Family History  Problem Relation Age of Onset   CAD Father 27       CABG    Prior to Admission medications   Medication Sig Start Date End Date Taking? Authorizing Provider  acetaminophen  (TYLENOL ) 500 MG tablet Take 500 mg by mouth every 8 (eight) hours as needed (for pain.).     [provider]  ALPRAZolam  (XANAX ) 0.5  MG tablet Take 1 mg by mouth at bedtime.    [provider]  aspirin  EC 81 MG tablet Take 81 mg by mouth at bedtime.    [provider]  isosorbide  mononitrate (IMDUR ) 30 MG 24 hr tablet Take 1 tablet (30 mg total) by mouth daily. 01/28/24   Arleen Lacer, MD  lisinopril  (ZESTRIL ) 20 MG tablet Take 1 tablet (20 mg total) by mouth daily. 09/19/22   Arleen Lacer, MD  metoprolol  succinate (TOPROL -XL) 25 MG 24 hr tablet Take 1 tablet (25 mg total) by mouth at bedtime. 01/28/24   Arleen Lacer, MD  metoprolol  tartrate (LOPRESSOR ) 25 MG tablet TAKE 1/2 TABLET TWICE A DAY BY MOUTH AS NEEDED 01/28/24   Arleen Lacer, MD  omeprazole (PRILOSEC) 40 MG capsule Take 40 mg by mouth at bedtime.     [provider]  QUEtiapine (SEROQUEL) 50 MG tablet Take 50 mg by mouth daily as needed.    [provider]  rivaroxaban  (XARELTO ) 20 MG TABS tablet Take 1 tablet (20 mg total) by mouth daily with supper. 01/28/24   Arleen Lacer, MD  rosuvastatin  (CRESTOR ) 20 MG tablet TAKE 1 TABLET BY MOUTH EVERY DAY 12/05/23   Arleen Lacer, MD     Physical Exam: Vitals:   02/02/24 2100 02/02/24 2130 02/02/24 2215 02/02/24 2235  BP: 128/81 134/72 125/75 136/80  Pulse: (!) 55 (!)  54 (!) 49 (!) 56  Resp: 18 17 (!) 32 (!) 25  Temp:    98.4 F (36.9 C)  TempSrc:    Oral  SpO2: 100% 100% 100% 100%  Weight:      Height:        Physical Exam Vitals and nursing note reviewed.  Constitutional:      General: He is not in acute distress.    Appearance: He is obese. He is not ill-appearing or toxic-appearing.  Cardiovascular:     Rate and Rhythm: Regular rhythm. Bradycardia present.     Heart sounds: Normal heart sounds. No murmur heard. Pulmonary:     Effort: Pulmonary effort is normal.     Breath sounds: Normal breath sounds.  Musculoskeletal:     Right lower leg: No edema.     Left lower leg: No edema.  Skin:    General: Skin is warm.     Capillary Refill: Capillary refill takes less than 2 seconds.  Neurological:     Mental Status: He is oriented to person, place, and time.  Psychiatric:        Mood and Affect: Mood is not anxious.      Labs on Admission: I have personally reviewed following labs and imaging studies  CBC: Recent Labs  Lab 01/28/24 0936 02/02/24 1937  WBC 11.5* 7.3  HGB 13.9 13.8  HCT 41.8 41.3  MCV 95 95.4  PLT 283 221   Basic Metabolic Panel: Recent Labs  Lab 01/28/24 0936 02/02/24 1937  NA 138 141  K 5.3* 3.6  CL 102 107  CO2 22 23  GLUCOSE 89 138*  BUN 23 9  CREATININE 1.60* 1.12  CALCIUM  9.5 9.0   GFR: Estimated Creatinine Clearance: 70.8 mL/min (by C-G formula based on SCr of 1.12 mg/dL). Liver Function Tests: Recent Labs  Lab 01/28/24 0936  AST 18  ALT 29  ALKPHOS 108  BILITOT 0.4  PROT 5.9*  ALBUMIN 4.2   No results for input(s): "LIPASE", "AMYLASE" in the last 168 hours. No results for input(s): "AMMONIA"  in the last 168 hours. Coagulation Profile: No results for input(s): "INR", "PROTIME" in the last 168 hours. Cardiac Enzymes: Recent Labs  Lab 02/02/24 1937  TROPONINIHS 9   BNP (last 3 results) No results for input(s): "BNP" in the last 8760 hours. HbA1C: No results for input(s):  "HGBA1C" in the last 72 hours. CBG: No results for input(s): "GLUCAP" in the last 168 hours. Lipid Profile: No results for input(s): "CHOL", "HDL", "LDLCALC", "TRIG", "CHOLHDL", "LDLDIRECT" in the last 72 hours. Thyroid  Function Tests: No results for input(s): "TSH", "T4TOTAL", "FREET4", "T3FREE", "THYROIDAB" in the last 72 hours. Anemia Panel: No results for input(s): "VITAMINB12", "FOLATE", "FERRITIN", "TIBC", "IRON", "RETICCTPCT" in the last 72 hours. Urine analysis:    Component Value Date/Time   COLORURINE YELLOW 02/02/2024 1938   APPEARANCEUR CLEAR 02/02/2024 1938   LABSPEC 1.024 02/02/2024 1938   PHURINE 5.0 02/02/2024 1938   GLUCOSEU NEGATIVE 02/02/2024 1938   HGBUR NEGATIVE 02/02/2024 1938   BILIRUBINUR NEGATIVE 02/02/2024 1938   KETONESUR NEGATIVE 02/02/2024 1938   PROTEINUR NEGATIVE 02/02/2024 1938   UROBILINOGEN 1.0 05/16/2012 1702   NITRITE NEGATIVE 02/02/2024 1938   LEUKOCYTESUR NEGATIVE 02/02/2024 1938    Radiological Exams on Admission: I have personally reviewed images DG Chest 2 View Result Date: 02/02/2024 CLINICAL DATA:  Chronic chest pain worsening today EXAM: CHEST - 2 VIEW COMPARISON:  09/12/2022 FINDINGS: Frontal and lateral views of the chest demonstrate an unremarkable cardiac silhouette. Linear densities at the lung bases consistent with subsegmental atelectasis or scarring. No acute airspace disease, effusion, or pneumothorax. No acute bony abnormalities. IMPRESSION: 1. Bibasilar subsegmental atelectasis or scarring. No acute airspace disease. Electronically Signed   By: Bobbye Burrow M.D.   On: 02/02/2024 20:48     EKG: My personal interpretation of EKG shows: Normal sinus rhythm heart rate 75.    Assessment/Plan: Principal Problem:   Unstable angina (HCC) Active Problems:   Paroxysmal atrial fibrillation (HCC)   History of CAD (coronary artery disease)   Essential hypertension   Hyperlipidemia   CAD, RCA/CFX DES 12/10 with residual 50-60%  LAD.- Cath 05/19/12- and 01/28/13 no ISR- med Rx   Chronic sinus bradycardia   Continuous dependence on cigarette smoking    Assessment and Plan: Unstable angina History of CAD s/p DE stent placement -Patient presenting with emergency department progressive worsening substernal chest pain.  Reported chest pain get worse with walking few steps.  Patient has been scheduled for heart cath on 02/12/2024. - Currently hemodynamically stable.  Initial troponin 9.  EKG showing normal sinus rhythm without any ST-T wave abnormality. - In the setting of unstable angina continue to check troponin, obtain echocardiogram, holding Xarelto  and starting IV heparin  drip in case cardiology will decide to plan for heart cath tomorrow. - Informed on-call cardiologist Dr. Leandrew Proctor will evaluate patient soon and recommended to keep patient n.p.o. as of now. -Continue aspirin , Lipitor.   -Holding Toprol -XL given patient is bradycardic heart rate dropped to 55. - Pressure stable and within good range.Holding lisinopril  to prevent AKI if patient undergoes heart cath tomorrow. -Current Imdur  30 mg daily. -Continue cardiac monitoring. -Keeping patient NPO. -Will follow-up with cardiology recommendation and plan. Addendum - Cardiology Dr. Leandrew Proctor informed that plan for heart cath in the morning.  Paroxysmal atrial fibrillation - CHA2DS2-VASc score = 3 based on age, essential hypertension and diastolic heart failure.  High risk of stroke. -Holding Toprol -XL in the setting of bradycardia.  Heart rate dropped to 55. -Starting heparin  drip in case patient will undergo  for heart cath tomorrow.  Holding Xarelto .   Chronic diastolic heart failure preserved EF 55 to 60% Essential hypertension -Echo from 08/2020 showing intermittent diastolic heart function and preserved EF 55 to 60% -Lisinopril  and Toprol -XL on hold.  Lisinopril  on hold tube when AKI if patient undergoes heart cath tomorrow.  Toprol -XL on hold as patient is  bradycardic. - Continue Imdur  30 mg daily. -Obtaining echocardiogram.   Hyperlipidemia - Continue Crestor   Chronic sinus bradycardia History of sinus bradycardia.  Heart rate dropped to 55.  Holding Toprol -XL.  EKG showing normal sinus rhythm heart rate 75.  Will resume once appropriate.  Continuous dependence of smoking cigarettes -Patient continues to smoke 1 and half pack cigarettes in a day. -Counseled patient at the bedside for smoking cessation. -Nicotine  contraindicated in the setting of unstable angina.   DVT prophylaxis:  IV heparin  gtts and SCD Code Status:  Full Code Diet: N.p.o. for anticipated heart cath possibly tomorrow Family Communication:   Currently no family member at bedside Disposition Plan: Continue to trend troponin and monitor for chest pain. Consults: Cardiology Admission status:   Inpatient, Telemetry bed  Severity of Illness: The appropriate patient status for this patient is INPATIENT. Inpatient status is judged to be reasonable and necessary in order to provide the required intensity of service to ensure the patient's safety. The patient's presenting symptoms, physical exam findings, and initial radiographic and laboratory data in the context of their chronic comorbidities is felt to place them at high risk for further clinical deterioration. Furthermore, it is not anticipated that the patient will be medically stable for discharge from the hospital within 2 midnights of admission.   * I certify that at the point of admission it is my clinical judgment that the patient will require inpatient hospital care spanning beyond 2 midnights from the point of admission due to high intensity of service, high risk for further deterioration and high frequency of surveillance required.Aaron Aas    Breeonna Mone, MD [facility]  How to contact the [facility] Attending or Consulting provider 7A - 7P or covering provider during after hours 7P -7A, for this patient.  Check  the care team in [facility] and look for a) attending/consulting [facility] provider listed and b) the [facility] team listed Log into [email or web address] and use [facility]'s universal password to access. If you do not have the password, please contact the hospital operator. Locate the [facility] provider you are looking for under [facility] and page to a number that you can be directly reached. If you still have difficulty reaching the provider, please page the [facility] (Director on Call) for the Hospitalists listed on amion for assistance.  02/02/2024, 11:05 PM

## 2024-02-02 NOTE — Progress Notes (Signed)
 PHARMACY - ANTICOAGULATION CONSULT NOTE  Pharmacy Consult for IV heparin  Indication: chest pain/ACS  Patient Measurements: Height: 5\' 4"  (162.6 cm) Weight: 85.3 kg (188 lb) IBW/kg (Calculated) : 59.2 HEPARIN  DW (KG): 77.4  Labs: Recent Labs    02/02/24 1937  HGB 13.8  HCT 41.3  PLT 221  CREATININE 1.12  TROPONINIHS 9  Estimated Creatinine Clearance: 70.8 mL/min (by C-G formula based on SCr of 1.12 mg/dL).   Medical History: Past Medical History:  Diagnosis Date   Acid reflux    Anxiety    Arthritis    CAD S/P percutaneous coronary angioplasty 12/10   RCA/CFX DES   Chest pain 01/2013   Evaluate cardiac catheterization, no obstructive CAD   Diverticulitis    Dysrhythmia    OCC PALPITATION    High cholesterol    History of kidney stones    Hypertension     Assessment: Eric Savage is a 58 y.o. year old male admitted on 02/02/2024 with concern for unstable angina. Xarelto  prior to admission for afib (last dose 4/26 ~6pm). Pharmacy consulted to dose heparin  for possible heart cath.  Goal of Therapy:  Heparin  level 66-102 units/ml Monitor platelets by anticoagulation protocol: Yes   Plan:  Heparin  2000 units x 1 as bolus followed by heparin  infusion at 950 units/hr 6 aPTT  Daily aPTT, heparin  level, CBC, and monitoring for bleeding F/u plans for anticoagulation and heart cath  Thank you for allowing pharmacy to participate in this patient's care.  Fonda Hymen, PharmD Emergency Medicine Clinical Pharmacist 02/02/2024,10:12 PM

## 2024-02-03 ENCOUNTER — Other Ambulatory Visit (HOSPITAL_COMMUNITY)

## 2024-02-03 ENCOUNTER — Encounter (HOSPITAL_COMMUNITY)
Admission: EM | Disposition: A | Discharge status: Home / Self Care | Payer: Self-pay | Source: Home / Self Care | Attending: Cardiology

## 2024-02-03 ENCOUNTER — Encounter (HOSPITAL_COMMUNITY): Payer: Self-pay | Admitting: Cardiology

## 2024-02-03 DIAGNOSIS — I2510 Atherosclerotic heart disease of native coronary artery without angina pectoris: Secondary | ICD-10-CM

## 2024-02-03 DIAGNOSIS — E782 Mixed hyperlipidemia: Secondary | ICD-10-CM

## 2024-02-03 DIAGNOSIS — Z9861 Coronary angioplasty status: Secondary | ICD-10-CM

## 2024-02-03 HISTORY — PX: CORONARY PRESSURE/FFR STUDY: CATH118243

## 2024-02-03 HISTORY — PX: LEFT HEART CATH AND CORONARY ANGIOGRAPHY: CATH118249

## 2024-02-03 LAB — COMPREHENSIVE METABOLIC PANEL WITH GFR
ALT: 21 U/L (ref 0–44)
AST: 16 U/L (ref 15–41)
Albumin: 3.1 g/dL — ABNORMAL LOW (ref 3.5–5.0)
Alkaline Phosphatase: 70 U/L (ref 38–126)
Anion gap: 10 (ref 5–15)
BUN: 9 mg/dL (ref 6–20)
CO2: 24 mmol/L (ref 22–32)
Calcium: 8.6 mg/dL — ABNORMAL LOW (ref 8.9–10.3)
Chloride: 107 mmol/L (ref 98–111)
Creatinine, Ser: 1.18 mg/dL (ref 0.61–1.24)
GFR, Estimated: >60 mL/min (ref >60)
Glucose, Bld: 112 mg/dL — ABNORMAL HIGH (ref 70–99)
Potassium: 3.6 mmol/L (ref 3.5–5.1)
Sodium: 141 mmol/L (ref 135–145)
Total Bilirubin: 0.4 mg/dL (ref 0.0–1.2)
Total Protein: 5.2 g/dL — ABNORMAL LOW (ref 6.5–8.1)

## 2024-02-03 LAB — POCT ACTIVATED CLOTTING TIME
Activated Clotting Time: 233 s
Activated Clotting Time: 285 s
Activated Clotting Time: 308 s

## 2024-02-03 LAB — CBC
HCT: 37.3 % — ABNORMAL LOW (ref 39.0–52.0)
Hemoglobin: 12.2 g/dL — ABNORMAL LOW (ref 13.0–17.0)
MCH: 31.1 pg (ref 26.0–34.0)
MCHC: 32.7 g/dL (ref 30.0–36.0)
MCV: 95.2 fL (ref 80.0–100.0)
Platelets: 197 10*3/uL (ref 150–400)
RBC: 3.92 MIL/uL — ABNORMAL LOW (ref 4.22–5.81)
RDW: 12 % (ref 11.5–15.5)
WBC: 6.8 10*3/uL (ref 4.0–10.5)
nRBC: 0 % (ref 0.0–0.2)

## 2024-02-03 LAB — TROPONIN I (HIGH SENSITIVITY)
Troponin I (High Sensitivity): 9 ng/L (ref <18)
Troponin I (High Sensitivity): 9 ng/L (ref <18)
Troponin I (High Sensitivity): 9 ng/L (ref <18)

## 2024-02-03 LAB — MAGNESIUM: Magnesium: 1.5 mg/dL — ABNORMAL LOW (ref 1.7–2.4)

## 2024-02-03 LAB — APTT
aPTT: >200 s (ref 24–36)
aPTT: 54 s — ABNORMAL HIGH (ref 24–36)
aPTT: 55 s — ABNORMAL HIGH (ref 24–36)

## 2024-02-03 LAB — HEPARIN LEVEL (UNFRACTIONATED): Heparin Unfractionated: >1.1 [IU]/mL — ABNORMAL HIGH (ref 0.30–0.70)

## 2024-02-03 LAB — HIV ANTIBODY (ROUTINE TESTING W REFLEX): HIV Screen 4th Generation wRfx: NONREACTIVE

## 2024-02-03 LAB — PHOSPHORUS: Phosphorus: 3.7 mg/dL (ref 2.5–4.6)

## 2024-02-03 MED ORDER — NITROGLYCERIN 1 MG/10 ML FOR IR/CATH LAB
INTRA_ARTERIAL | Status: AC
Start: 2024-02-03 — End: 2024-02-04
  Filled 2024-02-03: qty 10

## 2024-02-03 MED ORDER — MIDAZOLAM HCL 2 MG/2ML IJ SOLN
INTRAMUSCULAR | Status: AC
  Filled 2024-02-03: qty 2

## 2024-02-03 MED ORDER — SODIUM CHLORIDE 0.9 % WEIGHT BASED INFUSION: 3.0000 mL/kg/h | INTRAVENOUS | Status: AC

## 2024-02-03 MED ORDER — VERAPAMIL HCL 2.5 MG/ML IV SOLN
INTRAVENOUS | Status: AC
  Filled 2024-02-03: qty 2

## 2024-02-03 MED ORDER — FENTANYL CITRATE (PF) 100 MCG/2ML IJ SOLN
INTRAMUSCULAR | Status: AC
  Filled 2024-02-03: qty 2

## 2024-02-03 MED ORDER — SODIUM CHLORIDE 0.9 % IV SOLN: 250.0000 mL | INTRAVENOUS | Status: DC | PRN

## 2024-02-03 MED ORDER — HYDRALAZINE HCL 20 MG/ML IJ SOLN: 10.0000 mg | INTRAMUSCULAR | Status: AC | PRN

## 2024-02-03 MED ORDER — LABETALOL HCL 5 MG/ML IV SOLN: 10.0000 mg | INTRAVENOUS | Status: AC | PRN

## 2024-02-03 MED ORDER — HEPARIN (PORCINE) IN NACL 1000-0.9 UT/500ML-% IV SOLN
INTRAVENOUS | Status: DC | PRN
  Administered 2024-02-03: 1000 mL via SURGICAL_CAVITY

## 2024-02-03 MED ORDER — HEPARIN SODIUM (PORCINE) 1000 UNIT/ML IJ SOLN
INTRAMUSCULAR | Status: DC | PRN
  Administered 2024-02-03: 3000 [IU] via INTRAVENOUS
  Administered 2024-02-03: 4000 [IU] via INTRAVENOUS
  Administered 2024-02-03: 5000 [IU] via INTRAVENOUS

## 2024-02-03 MED ORDER — FENTANYL CITRATE (PF) 100 MCG/2ML IJ SOLN
INTRAMUSCULAR | Status: DC | PRN
  Administered 2024-02-03 (×4): 25 ug via INTRAVENOUS

## 2024-02-03 MED ORDER — QUETIAPINE FUMARATE 50 MG PO TABS
50.0000 mg | ORAL_TABLET | Freq: Every day | ORAL | Status: DC
  Administered 2024-02-03: 50 mg via ORAL
  Filled 2024-02-03: qty 1

## 2024-02-03 MED ORDER — SODIUM CHLORIDE 0.9 % WEIGHT BASED INFUSION: 1.0000 mL/kg/h | INTRAVENOUS | Status: DC

## 2024-02-03 MED ORDER — IOHEXOL 350 MG/ML SOLN
INTRAVENOUS | Status: DC | PRN
  Administered 2024-02-03: 90 mL

## 2024-02-03 MED ORDER — LIDOCAINE HCL (PF) 1 % IJ SOLN
INTRAMUSCULAR | Status: DC | PRN
  Administered 2024-02-03: 2 mL

## 2024-02-03 MED ORDER — RIVAROXABAN 20 MG PO TABS
20.0000 mg | ORAL_TABLET | Freq: Every day | ORAL | Status: DC
  Administered 2024-02-03: 20 mg via ORAL
  Filled 2024-02-03: qty 1

## 2024-02-03 MED ORDER — MIDAZOLAM HCL 2 MG/2ML IJ SOLN
INTRAMUSCULAR | Status: DC | PRN
Start: 2024-02-03 — End: 2024-02-03
  Administered 2024-02-03 (×5): 1 mg via INTRAVENOUS

## 2024-02-03 MED ORDER — MORPHINE SULFATE (PF) 2 MG/ML IV SOLN
2.0000 mg | INTRAVENOUS | Status: DC | PRN
  Administered 2024-02-04 (×2): 2 mg via INTRAVENOUS
  Filled 2024-02-03 (×2): qty 1

## 2024-02-03 MED ORDER — SODIUM CHLORIDE 0.9 % IV SOLN
INTRAVENOUS | Status: AC
Start: 2024-02-03 — End: 2024-02-04

## 2024-02-03 MED ORDER — ONDANSETRON HCL 4 MG/2ML IJ SOLN: 4.0000 mg | Freq: Four times a day (QID) | INTRAMUSCULAR | Status: DC | PRN

## 2024-02-03 MED ORDER — HEPARIN SODIUM (PORCINE) 1000 UNIT/ML IJ SOLN
INTRAMUSCULAR | Status: AC
  Filled 2024-02-03: qty 10

## 2024-02-03 MED ORDER — SODIUM CHLORIDE 0.9% FLUSH: 3.0000 mL | INTRAVENOUS | Status: DC | PRN

## 2024-02-03 MED ORDER — LIDOCAINE HCL (PF) 1 % IJ SOLN
INTRAMUSCULAR | Status: AC
  Filled 2024-02-03: qty 30

## 2024-02-03 MED ORDER — SODIUM CHLORIDE 0.9% FLUSH
3.0000 mL | Freq: Two times a day (BID) | INTRAVENOUS | Status: DC
  Administered 2024-02-04: 3 mL via INTRAVENOUS

## 2024-02-03 MED ORDER — VERAPAMIL HCL 2.5 MG/ML IV SOLN
INTRAVENOUS | Status: DC | PRN
  Administered 2024-02-03: 10 mL via INTRA_ARTERIAL

## 2024-02-03 NOTE — Interval H&P Note (Signed)
 History and Physical Interval Note:  02/03/2024 2:23 PM  Eric Savage  has presented today for surgery, with the diagnosis of progressive angina.  The various methods of treatment have been discussed with the patient and family. After consideration of risks, benefits and other options for treatment, the patient has consented to  Procedure(s): LEFT HEART CATH AND CORONARY ANGIOGRAPHY (N/A)  PERCUTANEOUS CORONARY INTERVENTION  as a surgical intervention.  The patient's history has been reviewed, patient examined, no change in status, stable for surgery.  I have reviewed the patient's chart and labs.  Questions were answered to the patient's satisfaction.    Cath Lab Visit (complete for each Cath Lab visit)  Clinical Evaluation Leading to the Procedure:   ACS: Yes.    Non-ACS:    Anginal Classification: CCS IV  Anti-ischemic medical therapy: Maximal Therapy (2 or more classes of medications)  Non-Invasive Test Results: No non-invasive testing performed  Prior CABG: No previous CABG     Randene Bustard

## 2024-02-03 NOTE — Brief Op Note (Addendum)
 BRIEF CARDIAC CATHETERIZATION NOTE   02/03/2024  3:53 PM SURGEON:  Surgeons and Role:    * Arleen Lacer, MD - Primary  PROCEDURE:  Procedure(s): LEFT HEART CATH AND CORONARY ANGIOGRAPHY (N/A) CORONARY PRESSURE/FFR STUDY (N/A)   PATIENT:  Loa Riling  58 y.o. male with distant history of DES PCI in 2010 to the proximal-mid RCA as well as proximal LCx.  Stents were patent in 2012 in 2014.  He also has PAF treated with Xarelto .  Unfortunately he was lost to follow-up as of 2019 but had done relatively well up until about 6 months ago when he started noticing having exertional chest pain.  He finally decided to present back for cardiology evaluation with progressive anginal symptoms.  Plan was to undergo cardiac catheterization in early May, however he was not able to tolerate the pain and therefore presented to emergency room on the evening of 02/02/2024.  Admitted for cardiac catheterization.  PRE-OPERATIVE DIAGNOSIS:  progressive angina  POST-OPERATIVE DIAGNOSIS:  3 Vessel CAD: Proximal RCA 80% proximal edge of previous stent with 100% CTO in the midportion of the stent.  The PDA and PLV with PL were perfused via left-to-right collaterals from the LCx and septal perforators Unable to cross lesion with 2 separate wires Focal eccentric 60 % smooth lesion in the proximal LAD with TIMI-3 flow distally-RFR 0.94 (not significant) Widely patent proximal LCx stent with 65 % stenosis just distal to the stent prior to bifurcation into OM1 (terminal LCx) and OM 2-RFR 0.98 Preserved LVEF, but unable to fully visualize for wall motion abnormality.  Echo pending LVEDP 22 mmHg  PROCEDURE PERFORMED:  Time Out: Verified patient identification, verified procedure, site/side was marked, verified correct patient position, special equipment/implants available, medications/allergies/relevent history reviewed, required imaging and test results available. Performed.  Access:  RIGHT Radial Artery: 6  Fr sheath -- Seldinger technique using Micropuncture Kit -- Direct ultrasound guidance used.  Permanent image obtained and placed on chart. -- 10 mL radial cocktail IA; 5000 Units IV Heparin   Left Heart Catheterization: 5&6Fr Catheters advanced or exchanged over a J-wire under direct fluoroscopic guidance into the ascending aorta; TIG 4.0 catheter advanced first.  * LV Hemodynamics (LV Gram): TIG 4.0 catheter * Left & Right Coronary Artery Cineangiography: TIG 4.0 catheter  Left Coronary RFR measurement: 6 French JL 4 guide catheter RCA attempted/aborted PTCA: 6 French AL 0.75 guide catheter  Review of initial angiography revealed:  Three-vessel disease as noted above  Preparations are made for RFR measurement of the LAD and LCx, once deemed to be not significant, preparations are made for attempted CTO PCI of the RCA. -> Additional boluses IV heparin  were ministered to achieve and maintain ACT> 250 seconds => No antiplatelet agent was administered as the wire was not able to cross lesion.  Upon completion of Angiogaphy, the catheter was removed completely out of the body over a wire, without complication.  Radial sheath removed in the Cardiac Catheterization lab with TR Band placed for hemostasis.  TR Band: 1545  Hours; 11 mL air; reverse Barbeau C  MEDICATIONS SQ Lidocaine  4 mL Radial Cocktail: 3 mg Verapmil in 10 mL NS Heparin : 12,000 units IC NTG 200 mcg x 1  ANESTHESIA:   local and IV sedation; 4 mL lidocaine ; 5 mg Versed , 100 mg fentanyl   EBL:  <50 mL  TR Band: 11 mL air, 1545 hrs., reverse Barbeau C  PATIENT DISPOSITION:  PACU - hemodynamically stable.  DICTATION: .Note written in EPIC  PLAN  OF CARE:  Aggressive medical management-have increased Imdur  to 60 mg and added amlodipine 5 mg, will probably not restart beta-blocker but will start low-dose ARB    Delay start of Pharmacological VTE agent (>24hrs) due to surgical blood loss or risk of bleeding: not  applicable

## 2024-02-03 NOTE — ED Provider Notes (Signed)
 [place] EMERGENCY DEPARTMENT AT [facility] Provider Note   CSN: [id number] Arrival date & time: 02/02/24  1918     History  Chief Complaint  Patient presents with   Chest Pain    Eric Savage is a 58 y.o. male.  With history of CAD status post DES, hypertension, hyperlipidemia and A-fib who presents to the ED for chest pain.  Persistent exertional chest pain has been increasing since last 6 months.  Was recently seen by cardiologist with plan for cardiac catheterization scheduled for May 7 however he has experienced persistent chest pain with minimal exertion with associated shortness of breath and headache.  He was instructed to come to the ED today for further evaluation from cardiology.  No active chest pain at rest.   Chest Pain      Home Medications Prior to Admission medications   Medication Sig Start Date End Date Taking? Authorizing Provider  acetaminophen  (TYLENOL ) 500 MG tablet Take 1,000 mg by mouth in the morning and at bedtime.   Yes [provider]  ALPRAZolam  (XANAX ) 0.5 MG tablet Take 0.5-1 mg by mouth at bedtime.   Yes [provider]  aspirin  EC 81 MG tablet Take 81 mg by mouth at bedtime.   Yes [provider]  isosorbide  mononitrate (IMDUR ) 30 MG 24 hr tablet Take 1 tablet (30 mg total) by mouth daily. 01/28/24  Yes Arleen Lacer, MD  lisinopril  (ZESTRIL ) 20 MG tablet Take 1 tablet (20 mg total) by mouth daily. 09/19/22  Yes Arleen Lacer, MD  metoprolol  succinate (TOPROL -XL) 25 MG 24 hr tablet Take 1 tablet (25 mg total) by mouth at bedtime. 01/28/24  Yes Arleen Lacer, MD  omeprazole (PRILOSEC) 40 MG capsule Take 40 mg by mouth at bedtime.    Yes [provider]  QUEtiapine (SEROQUEL) 50 MG tablet Take 50 mg by mouth at bedtime.   Yes [provider]  rivaroxaban  (XARELTO ) 20 MG TABS tablet Take 1 tablet (20 mg total) by mouth daily with supper. 01/28/24  Yes Arleen Lacer, MD   rosuvastatin  (CRESTOR ) 20 MG tablet TAKE 1 TABLET BY MOUTH EVERY DAY 12/05/23  Yes Arleen Lacer, MD  metoprolol  tartrate (LOPRESSOR ) 25 MG tablet TAKE 1/2 TABLET TWICE A DAY BY MOUTH AS NEEDED 01/28/24   Arleen Lacer, MD      Allergies    Nsaids    Review of Systems   Review of Systems  Cardiovascular:  Positive for chest pain.    Physical Exam Updated Vital Signs BP 136/80 (BP Location: Right Arm)   Pulse (!) 56   Temp 98.4 F (36.9 C) (Oral)   Resp (!) 25   Ht 5\' 4"  (1.626 m)   Wt 85.3 kg   SpO2 100%   BMI 32.27 kg/m  Physical Exam Vitals and nursing note reviewed.  HENT:     Head: Normocephalic and atraumatic.  Eyes:     Pupils: Pupils are equal, round, and reactive to light.  Cardiovascular:     Rate and Rhythm: Normal rate and regular rhythm.  Pulmonary:     Effort: Pulmonary effort is normal.     Breath sounds: Normal breath sounds.  Abdominal:     Palpations: Abdomen is soft.     Tenderness: There is no abdominal tenderness.  Skin:    General: Skin is warm and dry.  Neurological:     Mental Status: He is alert.  Psychiatric:  Mood and Affect: Mood normal.     ED Results / Procedures / Treatments   Labs (all labs ordered are listed, but only abnormal results are displayed) Labs Reviewed  BASIC METABOLIC PANEL WITH GFR - Abnormal; Notable for the following components:      Result Value   Glucose, Bld 138 (*)    All other components within normal limits  CBC  URINALYSIS, ROUTINE W REFLEX MICROSCOPIC  HIV ANTIBODY (ROUTINE TESTING W REFLEX)  COMPREHENSIVE METABOLIC PANEL WITH GFR  APTT  CBC  HEPARIN  LEVEL (UNFRACTIONATED)  TROPONIN I (HIGH SENSITIVITY)  TROPONIN I (HIGH SENSITIVITY)    EKG EKG Interpretation Date/Time:  Sunday February 02 2024 19:28:05 EDT Ventricular Rate:  75 PR Interval:  128 QRS Duration:  86 QT Interval:  392 QTC Calculation: 437 R Axis:   69  Text Interpretation: Normal sinus rhythm Normal ECG When  compared with ECG of 28-Jan-2024 08:24, PREVIOUS ECG IS PRESENT Confirmed by Rafael Bun [phone]) on 02/02/2024 9:26:25 PM  Radiology DG Chest 2 View Result Date: 02/02/2024 CLINICAL DATA:  Chronic chest pain worsening today EXAM: CHEST - 2 VIEW COMPARISON:  09/12/2022 FINDINGS: Frontal and lateral views of the chest demonstrate an unremarkable cardiac silhouette. Linear densities at the lung bases consistent with subsegmental atelectasis or scarring. No acute airspace disease, effusion, or pneumothorax. No acute bony abnormalities. IMPRESSION: 1. Bibasilar subsegmental atelectasis or scarring. No acute airspace disease. Electronically Signed   By: Bobbye Burrow M.D.   On: 02/02/2024 20:48    Procedures Procedures    Medications Ordered in ED Medications  aspirin  EC tablet 81 mg (has no administration in time range)  isosorbide  mononitrate (IMDUR ) 24 hr tablet 30 mg (has no administration in time range)  rosuvastatin  (CRESTOR ) tablet 20 mg (20 mg Oral Given 02/02/24 2239)  pantoprazole  (PROTONIX ) EC tablet 40 mg (has no administration in time range)  sodium chloride  flush (NS) 0.9 % injection 3 mL (has no administration in time range)  sodium chloride  flush (NS) 0.9 % injection 3 mL (has no administration in time range)  0.9 %  sodium chloride  infusion (has no administration in time range)  acetaminophen  (TYLENOL ) tablet 650 mg (650 mg Oral Given 02/02/24 2239)    Or  acetaminophen  (TYLENOL ) suppository 650 mg ( Rectal See Alternative 02/02/24 2239)  prochlorperazine (COMPAZINE) injection 10 mg (has no administration in time range)  heparin  ADULT infusion 100 units/mL (25000 units/250mL) (950 Units/hr Intravenous New Bag/Given 02/02/24 2249)  heparin  bolus via infusion 2,000 Units (2,000 Units Intravenous Bolus from Bag 02/02/24 2249)    ED Course/ Medical Decision Making/ A&P                                 Medical Decision Making 58 year old male with extensive cardiac history as  above presenting for exertional chest pain shortness of breath increasing.  Has cardiac cath scheduled for May 7 but does not feel as though he can wait that long.  Stable angina.  No findings of acute ischemic changes here in the ED however given high risk chest pain worsening with exertion will admit on telemetry and obtain cardiac workup during his admission  Amount and/or Complexity of Data Reviewed Labs: ordered. Radiology: ordered.  Risk Decision regarding hospitalization.           Final Clinical Impression(s) / ED Diagnoses Final diagnoses:  Coronary artery disease of native heart with stable angina pectoris, unspecified vessel  or lesion type [facility])    Rx / DC Orders ED Discharge Orders     None         Sallyanne Creamer, DO 02/03/24 0008

## 2024-02-03 NOTE — Progress Notes (Signed)
 Patient seen and examined.  Admitted early morning hours by nighttime hospitalist as unstable angina on heparin  infusion.  Discussed with cardiology service at the bedside.  Patient is currently stable and going for cardiac cath.  Transferring to cardiology service as primary attending.  TR[facility] will not follow-up.  Call if needed.  Same-day admit.  No charge visit.

## 2024-02-03 NOTE — Progress Notes (Signed)
 PHARMACY - ANTICOAGULATION CONSULT NOTE  Pharmacy Consult for IV heparin  Indication: chest pain/ACS  Patient Measurements: Height: 5\' 4"  (162.6 cm) Weight: 85.3 kg (188 lb) IBW/kg (Calculated) : 59.2 HEPARIN  DW (KG): 77.4  Labs: Recent Labs    02/02/24 1937 02/03/24 0000 02/03/24 0400 02/03/24 0548 02/03/24 1200  HGB 13.8  --  12.2*  --   --   HCT 41.3  --  37.3*  --   --   PLT 221  --  197  --   --   APTT  --   --  >200* 54* 55*  HEPARINUNFRC  --   --  >1.10*  --   --   CREATININE 1.12  --  1.18  --   --   TROPONINIHS 9 9 9 9   --   Estimated Creatinine Clearance: 67.2 mL/min (by C-G formula based on SCr of 1.18 mg/dL).   Medical History: Past Medical History:  Diagnosis Date   Acid reflux    Anxiety    Arthritis    CAD S/P percutaneous coronary angioplasty 12/10   RCA/CFX DES   Chest pain 01/2013   Evaluate cardiac catheterization, no obstructive CAD   Diverticulitis    Dysrhythmia    OCC PALPITATION    High cholesterol    History of kidney stones    Hypertension     Assessment: Eric Savage is a 58 y.o. year old male admitted on 02/02/2024 with concern for unstable angina. Xarelto  prior to admission for afib (last dose 4/26 ~6pm). Pharmacy consulted to dose heparin .  -aPPT subtherapeutic at 55 on heparin  1000 units/hr -plans for cardiac cath today  Goal of Therapy:  Heparin  level 66-102 units/ml Monitor platelets by anticoagulation protocol: Yes   Plan:  -With plans for cath today, will not adjust heparin   -Will follow plans post procedure  Baxter Limber, PharmD Clinical Pharmacist **Pharmacist phone directory can now be found on [email or web address] ([email or web address]).  Listed under [facility].

## 2024-02-03 NOTE — Progress Notes (Signed)
 PHARMACY - ANTICOAGULATION CONSULT NOTE  Pharmacy Consult for IV heparin  Indication: chest pain/ACS  Patient Measurements: Height: 5\' 4"  (162.6 cm) Weight: 85.3 kg (188 lb) IBW/kg (Calculated) : 59.2 HEPARIN  DW (KG): 77.4  Labs: Recent Labs    02/02/24 1937 02/03/24 0000 02/03/24 0400  HGB 13.8  --  12.2*  HCT 41.3  --  37.3*  PLT 221  --  197  APTT  --   --  >200*  HEPARINUNFRC  --   --  >1.10*  CREATININE 1.12  --  1.18  TROPONINIHS 9 9 9   Estimated Creatinine Clearance: 67.2 mL/min (by C-G formula based on SCr of 1.18 mg/dL).   Medical History: Past Medical History:  Diagnosis Date   Acid reflux    Anxiety    Arthritis    CAD S/P percutaneous coronary angioplasty 12/10   RCA/CFX DES   Chest pain 01/2013   Evaluate cardiac catheterization, no obstructive CAD   Diverticulitis    Dysrhythmia    OCC PALPITATION    High cholesterol    History of kidney stones    Hypertension     Assessment: Eric Savage is a 58 y.o. year old male admitted on 02/02/2024 with concern for unstable angina. Xarelto  prior to admission for afib (last dose 4/26 ~6pm). Pharmacy consulted to dose heparin  for possible heart cath.  Initial aPTT >200. STAT recheck aPTT which came back at 54, subtherapeutic  No bleeding noted by nurse or issues with infusion.  Goal of Therapy:  Heparin  level 66-102 units/ml Monitor platelets by anticoagulation protocol: Yes   Plan:  Increase heparin  infusion at 1000 units/hr 6 aPTT  Daily aPTT, heparin  level, CBC, and monitoring for bleeding F/u plans for anticoagulation and heart cath  Thank you for allowing pharmacy to participate in this patient's care.  Fonda Hymen, PharmD Emergency Medicine Clinical Pharmacist 02/03/2024,6:13 AM

## 2024-02-03 NOTE — Progress Notes (Signed)
 Patient transferred from ED; skin assessment done with Edsel Grace, RN

## 2024-02-04 ENCOUNTER — Other Ambulatory Visit (HOSPITAL_COMMUNITY): Payer: Self-pay

## 2024-02-04 ENCOUNTER — Inpatient Hospital Stay (HOSPITAL_COMMUNITY)

## 2024-02-04 ENCOUNTER — Telehealth: Payer: Self-pay | Admitting: Cardiology

## 2024-02-04 DIAGNOSIS — R079 Chest pain, unspecified: Secondary | ICD-10-CM

## 2024-02-04 HISTORY — PX: TRANSTHORACIC ECHOCARDIOGRAM: SHX275

## 2024-02-04 LAB — CBC
HCT: 35.9 % — ABNORMAL LOW (ref 39.0–52.0)
Hemoglobin: 12.2 g/dL — ABNORMAL LOW (ref 13.0–17.0)
MCH: 31.4 pg (ref 26.0–34.0)
MCHC: 34 g/dL (ref 30.0–36.0)
MCV: 92.3 fL (ref 80.0–100.0)
Platelets: 182 10*3/uL (ref 150–400)
RBC: 3.89 MIL/uL — ABNORMAL LOW (ref 4.22–5.81)
RDW: 12.1 % (ref 11.5–15.5)
WBC: 6.9 10*3/uL (ref 4.0–10.5)
nRBC: 0 % (ref 0.0–0.2)

## 2024-02-04 LAB — HEPARIN LEVEL (UNFRACTIONATED): Heparin Unfractionated: >1.1 [IU]/mL — ABNORMAL HIGH (ref 0.30–0.70)

## 2024-02-04 LAB — ECHOCARDIOGRAM COMPLETE
AR max vel: 2.26 cm2
AV Area VTI: 2.16 cm2
AV Area mean vel: 2.24 cm2
AV Mean grad: 6 mmHg
AV Peak grad: 13.1 mmHg
Ao pk vel: 1.81 m/s
Area-P 1/2: 2.73 cm2
Height: 65 in
S' Lateral: 2.9 cm
Weight: 3008 [oz_av]

## 2024-02-04 LAB — APTT: aPTT: 35 s (ref 24–36)

## 2024-02-04 LAB — LIPOPROTEIN A (LPA): Lipoprotein (a): 165.2 nmol/L — ABNORMAL HIGH (ref <75.0)

## 2024-02-04 MED ORDER — ROSUVASTATIN CALCIUM 40 MG PO TABS
40.0000 mg | ORAL_TABLET | Freq: Every day | ORAL | 3 refills | Status: DC
  Filled 2024-02-04 – 2024-03-10 (×2): qty 30, 30d supply, fill #0
  Filled 2024-05-12: qty 30, 30d supply, fill #1

## 2024-02-04 MED ORDER — MORPHINE SULFATE (PF) 2 MG/ML IV SOLN
2.0000 mg | Freq: Once | INTRAVENOUS | Status: AC
  Administered 2024-02-04: 2 mg via INTRAVENOUS
  Filled 2024-02-04: qty 1

## 2024-02-04 MED ORDER — ISOSORBIDE MONONITRATE ER 60 MG PO TB24
60.0000 mg | ORAL_TABLET | Freq: Every day | ORAL | Status: DC
  Administered 2024-02-04: 60 mg via ORAL
  Filled 2024-02-04: qty 1

## 2024-02-04 MED ORDER — MAGNESIUM SULFATE 4 GM/100ML IV SOLN
4.0000 g | Freq: Once | INTRAVENOUS | Status: AC
  Administered 2024-02-04: 4 g via INTRAVENOUS
  Filled 2024-02-04: qty 100

## 2024-02-04 MED ORDER — ISOSORBIDE MONONITRATE ER 60 MG PO TB24
60.0000 mg | ORAL_TABLET | Freq: Every day | ORAL | 11 refills | Status: DC
Start: 2024-02-04 — End: 2024-08-14

## 2024-02-04 MED ORDER — ISOSORBIDE MONONITRATE ER 60 MG PO TB24: 60.0000 mg | ORAL_TABLET | Freq: Every day | ORAL | Status: DC

## 2024-02-04 MED ORDER — AMLODIPINE BESYLATE 5 MG PO TABS
5.0000 mg | ORAL_TABLET | Freq: Every day | ORAL | Status: DC
  Administered 2024-02-04: 5 mg via ORAL
  Filled 2024-02-04: qty 1

## 2024-02-04 MED ORDER — ROSUVASTATIN CALCIUM 40 MG PO TABS: 40.0000 mg | ORAL_TABLET | Freq: Every day | ORAL | Status: DC

## 2024-02-04 MED ORDER — ROSUVASTATIN CALCIUM 20 MG PO TABS: 40.0000 mg | ORAL_TABLET | Freq: Every day | ORAL | Status: DC

## 2024-02-04 MED ORDER — AMLODIPINE BESYLATE 5 MG PO TABS
5.0000 mg | ORAL_TABLET | Freq: Every day | ORAL | 11 refills | Status: DC
  Filled 2024-02-04 – 2024-03-10 (×2): qty 30, 30d supply, fill #0

## 2024-02-04 MED ORDER — PERFLUTREN LIPID MICROSPHERE
1.0000 mL | INTRAVENOUS | Status: AC | PRN
  Administered 2024-02-04: 2 mL via INTRAVENOUS

## 2024-02-04 NOTE — Discharge Summary (Addendum)
 Discharge Summary    Patient ID: Eric Savage MRN: [id number]; DOB: January 10, 1966  Admit date: 02/02/2024 Discharge date: 02/04/2024  PCP:  Eric Glimpse, NP   [facility] Providers Cardiologist:  Eric Bustard, MD     Discharge Diagnoses    Principal Problem:   Unstable angina [facility]) Active Problems:   CAD, RCA/CFX DES 12/10 with residual 50-60% LAD.- Cath 05/19/12- and 01/28/13 no ISR- med Rx   Essential hypertension   Hyperlipidemia   Paroxysmal atrial fibrillation (HCC)   History of CAD (coronary artery disease)   Chronic sinus bradycardia   Continuous dependence on cigarette smoking  Diagnostic Studies/Procedures    Cath: 02/03/2024    Prox LAD lesion is 60% stenosed.  (RFR 0.93-NOT a Significant)   Previously placed Prox Cx to Dist Cx stent of unknown type is  widely patent.   -------------------------   Dist Cx lesion is 65% stenosed.  (RFR 0.98-NOT SIGNIFICANT)   -------------------------   Prox RCA lesion is 80% stenosed.   Previously placed Prox RCA to Dist RCA lesion is 100% stenosed-mid stent.  Balloon angioplasty was attempted-aborted; unable to cross lesion with wire   -------------------------   The left ventricular systolic function is normal.  LVEF 50 and 55%.  Moderately elevated LVEDP   There is no aortic valve stenosis.   Dominance: Right  POST-OPERATIVE DIAGNOSIS:  3 Vessel CAD: Proximal RCA 80% proximal edge of previous stent with 100% CTO in the midportion of the stent.  The PDA and PLV with PL were perfused via left-to-right collaterals from the LCx and septal perforators Unable to cross lesion with 2 separate wires Focal eccentric 60 % smooth lesion in the proximal LAD with TIMI-3 flow distally-RFR 0.94 (not significant) Widely patent proximal LCx stent with 65 % stenosis just distal to the stent prior to bifurcation into OM2 (terminal LCx) and OM 3-RFR 0.98 Preserved LVEF, but unable to fully visualize for wall motion abnormality.   Echo pending LVEDP 22 mmHg     RECOMMENDATIONS   In the absence of any other complications or medical issues, we expect the patient to be ready for discharge from a cath perspective on 02/04/2024.   Titrate antianginals: Replace ACE inhibitor with amlodipine  5 mg daily, and increase Imdur  to 30 mg daily.  Low threshold to initiate Ranexa . Reduce/discontinue beta-blocker in setting of bradycardia   Recommend to resume Rivaroxaban , at currently prescribed dose and frequency.   Recommend concurrent antiplatelet therapy of Aspirin  81 mg daily for 6 months. _____________   History of Present Illness     Eric Savage is a 58 y.o. male with a hx of x of early onset coronary artery disease (stents to the LCx and RCA in 2010, patent by cath in 2014, low risk nuclear stress test 2020), smoking, hypercholesterolemia, paroxysmal atrial fibrillation, excessive alcohol use who was seen for the evaluation of chest pain at the request of hospital medicine. Eric Savage reported that with walking 100 feet, he would get chest pain, pain in his right neck, head pain, and shortness of breath. His chest pain had been getting worse. He reports "I have not done anything this weekend except sitting in his chair".  He was scheduled to have an outpatient heart cath on 5/7, however given the severity of his chest pain and progressive nature, he decided to drive to the emergency room tonight to have it evaluated given expedited heart cath.   He was still smoking, but was down from 2 packs  a day to 4-8 cigarettes per day. His wife still smokes 2 packs per day.   Recently he started having tooth decay, and had a tooth removed. He subsequently got a dry socket, and a gum ulcer, that is getting better. There are no plans for any future teeth procedure. His dentist is Eric Savage.    He is taking rosuvastatin  20mg  daily without issues, last dose 4/26 evening at 6pm. He is on rivaroxaban  for atrial fibrillation.  He was  admitted for further evaluation with concerns for unstable angina.  Hospital Course     Unstable Angina CAD -- High-sensitivity troponin negative x 2, underwent cardiac catheterization 4/28 with proximal RCA disease of 80% at edge of previous stent with 100% CTO midportion of stent.  PDA and PLV perfused via left-to-right collaterals from circumflex.  Unable to cross lesion with 2 separate wires.  Focal 60% lesion of the proximal LAD with TIMI-3 flow and RFR 0.94, widely patent circumflex stent with 65% stenosis distal to stent into bifurcation of OM2 and OM 3 with RFR of 0.98.  Recommendations for continued medical therapy. -- Echo 4/29 with LVEF of 60-65%, no rWMA, g1DD, normal RV, trivial MR -- On aspirin , increase Imdur  from 30 to 60mg  daily, switch lisinopril  to norvasc  5mg  daily. Continue Toprol  XL 25mg  daily     HLD -- FLP 4/22 LDL 53, HDL 38, LP(a) 165  -- increase Crestor  from 20 to 40 mg daily  Paroxysmal atrial fibrillation -- Sinus rhythm during admission, home Xarelto  resumed post cath  Tobacco use -- Cessation advised  General: Well developed, well nourished, male appearing in no acute distress. Head: Normocephalic, atraumatic.  Neck: Supple without bruits, JVD. Lungs:  Resp regular and unlabored, CTA. Heart: RRR, S1, S2, or murmur; no rub. Abdomen: Soft, non-tender, non-distended with normoactive bowel sounds. Extremities: No clubbing, cyanosis, edema. Distal pedal pulses are 2+ bilaterally. Right radial cath site stable without bruising or hematoma Neuro: Alert and oriented X 3. Moves all extremities spontaneously. Psych: Normal affect.  Patient was seen by myself and Eric Savage and deemed stable for discharge home.  Follow-up arranged in the office.  Medication sent to [facility] pharmacy.  Educated by [employer].D. prior to discharge.  Did the patient have an acute coronary syndrome (MI, NSTEMI, STEMI, etc) this admission?:  No                               Did the patient  have a percutaneous coronary intervention (stent / angioplasty)?:  No.     The patient will be scheduled for a TOC follow up appointment in 10-14 days.  A message has been sent to the [facility] and Scheduling Pool at the office where the patient should be seen for follow up.  _____________  Discharge Vitals Blood pressure (!) 146/77, pulse 66, temperature 97.6 F (36.4 C), temperature source Oral, resp. rate 18, height 5\' 5"  (1.651 m), weight 85.3 kg, SpO2 94%.  Filed Weights   02/02/24 1932  Weight: 85.3 kg    Labs & Radiologic Studies    CBC Recent Labs    02/03/24 0400 02/04/24 0445  WBC 6.8 6.9  HGB 12.2* 12.2*  HCT 37.3* 35.9*  MCV 95.2 92.3  PLT 197 182   Basic Metabolic Panel Recent Labs    78/29/56 1937 02/03/24 0400 02/03/24 0548  NA 141 141  --   K 3.6 3.6  --   CL  107 107  --   CO2 23 24  --   GLUCOSE 138* 112*  --   BUN 9 9  --   CREATININE 1.12 1.18  --   CALCIUM  9.0 8.6*  --   MG  --   --  1.5*  PHOS  --   --  3.7   Liver Function Tests Recent Labs    02/03/24 0400  AST 16  ALT 21  ALKPHOS 70  BILITOT 0.4  PROT 5.2*  ALBUMIN 3.1*   No results for input(s): "LIPASE", "AMYLASE" in the last 72 hours. High Sensitivity Troponin:   Recent Labs  Lab 02/02/24 1937 02/03/24 0000 02/03/24 0400 02/03/24 0548  TROPONINIHS 9 9 9 9     BNP Invalid input(s): "POCBNP" D-Dimer No results for input(s): "DDIMER" in the last 72 hours. Hemoglobin A1C No results for input(s): "HGBA1C" in the last 72 hours. Fasting Lipid Panel No results for input(s): "CHOL", "HDL", "LDLCALC", "TRIG", "CHOLHDL", "LDLDIRECT" in the last 72 hours. Thyroid  Function Tests No results for input(s): "TSH", "T4TOTAL", "T3FREE", "THYROIDAB" in the last 72 hours.  Invalid input(s): "FREET3" _____________  DG Chest 2 View Result Date: 02/02/2024 CLINICAL DATA:  Chronic chest pain worsening today EXAM: CHEST - 2 VIEW COMPARISON:  09/12/2022 FINDINGS: Frontal and lateral views  of the chest demonstrate an unremarkable cardiac silhouette. Linear densities at the lung bases consistent with subsegmental atelectasis or scarring. No acute airspace disease, effusion, or pneumothorax. No acute bony abnormalities. IMPRESSION: 1. Bibasilar subsegmental atelectasis or scarring. No acute airspace disease. Electronically Signed   By: Bobbye Burrow M.D.   On: 02/02/2024 20:48   Disposition   Pt is being discharged home today in good condition.  Follow-up Plans & Appointments     Discharge Instructions     Call MD for:  difficulty breathing, headache or visual disturbances   Complete by: As directed    Call MD for:  persistant dizziness or light-headedness   Complete by: As directed    Call MD for:  redness, tenderness, or signs of infection (pain, swelling, redness, odor or green/yellow discharge around incision site)   Complete by: As directed    Diet - low sodium heart healthy   Complete by: As directed    Discharge instructions   Complete by: As directed    Radial Site Care Refer to this sheet in the next few weeks. These instructions provide you with information on caring for yourself after your procedure. Your caregiver may also give you more specific instructions. Your treatment has been planned according to current medical practices, but problems sometimes occur. Call your caregiver if you have any problems or questions after your procedure. HOME CARE INSTRUCTIONS You may shower the day after the procedure. Remove the bandage (dressing) and gently wash the site with plain soap and water . Gently pat the site dry.  Do not apply powder or lotion to the site.  Do not submerge the affected site in water  for 3 to 5 days.  Inspect the site at least twice daily.  Do not flex or bend the affected arm for 24 hours.  No lifting over 5 pounds (2.3 kg) for 5 days after your procedure.  Do not drive home if you are discharged the same day of the procedure. Have someone else  drive you.  You may drive 24 hours after the procedure unless otherwise instructed by your caregiver.  What to expect: Any bruising will usually fade within 1 to 2 weeks.  Blood  that collects in the tissue (hematoma) may be painful to the touch. It should usually decrease in size and tenderness within 1 to 2 weeks.  SEEK IMMEDIATE MEDICAL CARE IF: You have unusual pain at the radial site.  You have redness, warmth, swelling, or pain at the radial site.  You have drainage (other than a small amount of blood on the dressing).  You have chills.  You have a fever or persistent symptoms for more than 72 hours.  You have a fever and your symptoms suddenly get worse.  Your arm becomes pale, cool, tingly, or numb.  You have heavy bleeding from the site. Hold pressure on the site.   Increase activity slowly   Complete by: As directed         Discharge Medications   Allergies as of 02/04/2024       Reactions   Nsaids Other (See Comments)   Causes irregular heart beat        Medication List     STOP taking these medications    lisinopril  20 MG tablet Commonly known as: ZESTRIL    metoprolol  succinate 25 MG 24 hr tablet Commonly known as: TOPROL -XL   metoprolol  tartrate 25 MG tablet Commonly known as: LOPRESSOR        TAKE these medications    acetaminophen  500 MG tablet Commonly known as: TYLENOL  Take 1,000 mg by mouth in the morning and at bedtime.   ALPRAZolam  0.5 MG tablet Commonly known as: XANAX  Take 0.5-1 mg by mouth at bedtime.   amLODipine  5 MG tablet Commonly known as: NORVASC  Take 1 tablet (5 mg total) by mouth daily.   aspirin  EC 81 MG tablet Take 81 mg by mouth at bedtime.   isosorbide  mononitrate 60 MG 24 hr tablet Commonly known as: IMDUR  Take 1 tablet (60 mg total) by mouth daily. What changed:  medication strength how much to take   omeprazole 40 MG capsule Commonly known as: PRILOSEC Take 40 mg by mouth at bedtime.   QUEtiapine  50 MG  tablet Commonly known as: SEROQUEL  Take 50 mg by mouth at bedtime.   rivaroxaban  20 MG Tabs tablet Commonly known as: Xarelto  Take 1 tablet (20 mg total) by mouth daily with supper.   rosuvastatin  40 MG tablet Commonly known as: CRESTOR  Take 1 tablet (40 mg total) by mouth daily. What changed:  medication strength how much to take           Outstanding Labs/Studies   FLP/LFTs in 8 weeks  Duration of Discharge Encounter: APP Time: 25 minutes   Signed, Johnie Nailer, NP 02/04/2024, 2:13 PM  ATTENDING ATTESTATION  I have seen, examined and evaluated the patient on rounds along with Johnie Nailer, NP after reviewing all the available data and chart, we discussed the patients laboratory, study & physical findings as well as symptoms in detail.  We also discussed general summary of the patient's hospitalization along with pertinent findings, impressions and recommendations.  I agree with her findings, examination, impression recommendations and hospital summary as noted above.  Attending adjustments noted in italics.   We reviewed his cardiac cath films and plans for treatment going forward.  We discussed that it is possible for him to be having symptoms from the occluded RCA but is not a PCI option at this point.  Plan will be to continue aggressive medical management for now starting with initiation of Ranexa  and reduced beta-blocker because of bradycardia.  Exam performed by me.  Total time spent with patient on  day of discharge discussing cath and plan => 30 minutes.    Arleen Lacer, MD, MS Eric Bustard, MD., M.S. Interventional Chartered certified accountant  Pager # [phone]

## 2024-02-04 NOTE — TOC CM/SW Note (Signed)
 Transition of Care [facility]) - Inpatient Brief Assessment   Patient Details  Name: Eric Savage DOBKIN MRN: [id number] Date of Birth: [birth year]  Transition of Care [facility]) CM/SW Contact:    Cosimo Diones, RN Phone Number: 02/04/2024, 3:29 PM   Clinical Narrative: Patient presented for unstable angina. PTA patient was independent from home with spouse. Patient states he has a PCP and gets to appointments without any issues. Spouse and mother at the bedside during the visit. No home needs identified and spouse to transport home.    Transition of Care Asessment: Insurance and Status: Insurance coverage has been reviewed Patient has primary care physician: Yes Home environment has been reviewed: reviewed Prior level of function:: independent Prior/Current Home Services: No current home services Social Drivers of Health Review: SDOH reviewed no interventions necessary Readmission risk has been reviewed: Yes Transition of care needs: no transition of care needs at this time

## 2024-02-04 NOTE — Telephone Encounter (Signed)
 Spoke with pt regarding pain medication. Pt was told a prescription for Imdur  was sent to the pharmacy. It was explained to the pt that Imdur  is to help with chest pain. Pt was unaware of the prescription and now plans to pick it up. Pt was told that if he begins to have chest pain, especially pain that radiates down the arm or sob, that he would need to go to the ED. Pt verbalized understanding. All questions if any were answered.

## 2024-02-04 NOTE — Telephone Encounter (Signed)
 Pt c/o medication issue:  1. Name of Medication: Pain medication, unsure of the name  2. How are you currently taking this medication (dosage and times per day)? N/A  3. Are you having a reaction (difficulty breathing--STAT)? No   4. What is your medication issue? Patient states someone called him stating they forgot to give him pain medication before discharge. He also states when someone from the hospital gave him his bag they were unsure of the who contacted him to assist. He is calling wanting to be advised on what to do regarding this since being discharged. Please advise.

## 2024-02-04 NOTE — Plan of Care (Signed)
  Problem: Health Behavior/Discharge Planning: Goal: Ability to manage health-related needs will improve Outcome: Progressing   Problem: Clinical Measurements: Goal: Will remain free from infection Outcome: Progressing Goal: Respiratory complications will improve Outcome: Progressing Goal: Cardiovascular complication will be avoided Outcome: Progressing   Problem: Activity: Goal: Risk for activity intolerance will decrease Outcome: Progressing   Problem: Nutrition: Goal: Adequate nutrition will be maintained Outcome: Progressing   Problem: Safety: Goal: Ability to remain free from injury will improve Outcome: Progressing   Problem: Cardiovascular: Goal: Ability to achieve and maintain adequate cardiovascular perfusion will improve Outcome: Progressing

## 2024-02-05 ENCOUNTER — Telehealth: Payer: Self-pay | Admitting: Cardiology

## 2024-02-05 NOTE — Telephone Encounter (Signed)
 Pt's spouse calling stating that pt needs work note, requesting cb

## 2024-02-05 NOTE — Telephone Encounter (Signed)
 Per discussion with the patient, okay to return to work 1 week post cath => Tuesday, Feb 10, 2023  Randene Bustard, MD

## 2024-02-05 NOTE — Telephone Encounter (Signed)
 Spoke to patient's wife she was calling to get husband a note to return to work.Advised Dr.[name] is out of office.I will send message to him for advice.

## 2024-02-06 ENCOUNTER — Other Ambulatory Visit (HOSPITAL_COMMUNITY): Payer: Self-pay

## 2024-02-06 ENCOUNTER — Ambulatory Visit: Attending: Cardiology

## 2024-02-06 VITALS — BP 136/84 | HR 77 | Resp 20

## 2024-02-06 DIAGNOSIS — I25118 Atherosclerotic heart disease of native coronary artery with other forms of angina pectoris: Secondary | ICD-10-CM | POA: Diagnosis not present

## 2024-02-06 MED ORDER — RANOLAZINE ER 500 MG PO TB12
500.0000 mg | ORAL_TABLET | Freq: Two times a day (BID) | ORAL | 3 refills | Status: DC
  Filled 2024-02-06: qty 60, 30d supply, fill #0
  Filled 2024-03-10: qty 60, 30d supply, fill #1

## 2024-02-06 MED ORDER — METOPROLOL SUCCINATE ER 25 MG PO TB24
25.0000 mg | ORAL_TABLET | Freq: Every day | ORAL | 3 refills | Status: AC
  Filled 2024-02-06: qty 30, 30d supply, fill #0
  Filled 2024-02-06: qty 90, 90d supply, fill #0
  Filled 2024-05-12: qty 30, 30d supply, fill #1
  Filled 2024-09-28: qty 30, 30d supply, fill #0
  Filled 2024-10-27: qty 30, 30d supply, fill #1

## 2024-02-06 NOTE — Telephone Encounter (Signed)
 Called and spoke to patient . To see if he needs a note for work.    Patient states he is in the [place] office on [address] at the present time. Rn informed patient she was calling him form a another location. RN informed patient the [facility] office  should be able to take of his needs. He verbalized understanding.

## 2024-02-06 NOTE — Telephone Encounter (Addendum)
 Patient presented to office with complaints of chest pain persisting post cath (please see 5/1 clinical support note)  During walk in, Dr. Berry Bristol added Metoprol S 25 MG daily and Ranexa  500 MG BID   At the end of walk in patient requested updated return to work letter  Patient would like letter to state his return to work date is undetermined due to persistent symptoms

## 2024-02-06 NOTE — Progress Notes (Signed)
   Nurse Visit   Date of Encounter: 02/06/2024 ID: Eric Savage, DOB 1966-08-17, MRN [id number]  PCP:  Verlene Glimpse, NP   [facility] Providers Cardiologist:  Randene Bustard, MD      Visit Details   VS:  BP 136/84 (BP Location: Right Arm, Patient Position: Sitting, Cuff Size: Normal)   Pulse 77   Resp 20   SpO2 96%  , BMI There is no height or weight on file to calculate BMI.  Wt Readings from Last 3 Encounters:  02/02/24 188 lb (85.3 kg)  01/28/24 188 lb 9.6 oz (85.5 kg)  09/18/22 175 lb (79.4 kg)     Reason for visit: Walked in for Chest pain post cath   Patient had a cath on 4/28 with no Interventions. Was started on Imdur  60 MG daily. Patient reports he is still having chest pain. He states that the Imdur  has helped "stretch out" the chest pain they are not occurring as frequently. He continues to had intermittent chest pain that last 5-10 minutes. When he initially walked into the office he had 4/10 chest pain. This RN advised ED evaluation, patient declined and stated he did not want to be admitted for long, he would like prescription for NTG. After a few minutes patient reported chest pain has resolved. RN consulted with Dr. Berry Bristol who recommended adding Metoprolol  S 25 mg daily and Ranexa  500 MG. RN reviewed Rx instruction/education. Reviewed holding parameters with patient. Patient requested updated return to work letter. Informed patient message will be sent to Dr. Addie Holstein regarding letter request.    Performed today: Vitals, Provider consulted:Dr. Berry Bristol (DOD), and Education Changes (medications, testing, etc.) : Start Metoprolol  S 25 mg daily and Ranexa  500 MG BID  Length of Visit: 30 minutes    ICD-10-CM   1. Coronary artery disease of native artery of native heart with stable angina pectoris (HCC)  I25.118 metoprolol  succinate (TOPROL -XL) 25 MG 24 hr tablet    ranolazine  (RANEXA ) 500 MG 12 hr tablet      Medications Adjustments/Labs and Tests  Ordered: No orders of the defined types were placed in this encounter.  Meds ordered this encounter  Medications   metoprolol  succinate (TOPROL -XL) 25 MG 24 hr tablet    Sig: Take 1 tablet (25 mg total) by mouth daily.    Dispense:  90 tablet    Refill:  3   ranolazine  (RANEXA ) 500 MG 12 hr tablet    Sig: Take 1 tablet (500 mg total) by mouth 2 (two) times daily.    Dispense:  180 tablet    Refill:  3     Signed, Knox Perl, MD  02/06/2024 6:24 PM

## 2024-02-10 NOTE — Telephone Encounter (Signed)
 Pt calling back to f/u on Return to Work note. Pt states that he I supposed to return tomorrow and would like a call as soon as possible. Please advise

## 2024-02-10 NOTE — Telephone Encounter (Signed)
 Spoke to patient he stated he plans to return to work tomorrow 5/6.Stated he needs a note to return to work today.Advised letter sent to [employer] and wife's email.

## 2024-02-10 NOTE — Telephone Encounter (Signed)
 Patient states he haws received work note. I t was emailed x3  to the emails that were given by patient.  Patient sates he will come by and pick the note tomorrow 02/11/24

## 2024-02-10 NOTE — Telephone Encounter (Signed)
 I think he probably is okay to go back to work, but if he needs another day or 2 we can.  The issue would be if he is out too long then we have to go into FMLA issues.  Randene Bustard, MD

## 2024-02-12 ENCOUNTER — Ambulatory Visit (HOSPITAL_COMMUNITY): Admit: 2024-02-12 | Admitting: Cardiology

## 2024-02-17 NOTE — Progress Notes (Unsigned)
 Cardiology Office Note    Patient Name: Eric Savage Date of Encounter: 02/17/2024  Primary Care Provider:  Verlene Glimpse, NP Primary Cardiologist:  Randene Bustard, MD Primary Electrophysiologist: None   Past Medical History    Past Medical History:  Diagnosis Date   Acid reflux    Anxiety    Arthritis    CAD S/P percutaneous coronary angioplasty 12/10   RCA/CFX DES   Chest pain 01/2013   Evaluate cardiac catheterization, no obstructive CAD   Diverticulitis    Dysrhythmia    OCC PALPITATION    High cholesterol    History of kidney stones    Hypertension     History of Present Illness  Eric Savage is a 58 y.o. male with a PMH of CAD s/p RCA/CFX DES 09/2009 patent after LHC in 2013 and CTO of RCA left-to-right collateral from circumflex and septal perforators 01/2024, HTN, HLD, tobacco abuse, atrial fibrillation presents today for posthospital follow-up.  Eric Savage was seen initially by Dr. Addie Savage in 2010 complaining of intermittent chest pain and false negative Myoview .  He he was seen in the ED with unstable angina and underwent LHC with PCI to RCA and circumflex with DES.  Had an outpatient Myoview  in 2012 that was normal.  He was seen in 05/2012 with complaint of substernal chest pain x 2 days.  He was admitted overnight and ruled out for MI.  Underwent outpatient LHC on 05/19/2012 that showed widely patent circumflex and RCA with moderate lesion in the diagonal branches of the ostium with no culprit lesion present.  He was seen on 01/28/2013 with complaint of midsternal chest pain in the setting of running out of all of his medicines after losing his job.  Troponins were negative and patient was admitted and underwent repeat LHC that showed no evidence of restenosis in the RCA or left circumflex with mild 20% LAD narrowing and 60 to 70% smooth mid second diagonal stenosis.  He was diagnosed with AF with RVR on 08/05/2019 and was started on Xarelto  and metoprolol .  He was  seen in the AF clinic for follow-up on 08/09/2021 and was found to be bradycardic and fatigue and metoprolol  decreased to 12.5 mg and Zio patch was ordered for 2 weeks.  Event monitor showed longest episode of AF was hours.  He was last seen on 01/28/2024 by Dr. Addie Savage and endorsed chest pain and shortness of breath over the past 6 to 8 months.  He had LHC ordered for further evaluation and to provide clearance for dental procedure.  He underwent left heart cath on 02/03/2024 showing 60% stenosed proximal LAD with RFR showing nonsignificant, 65% distal circumflex lesion also nonsignificant with 80% proximal RCA stenosis and 20% stenosed mid RCA with attempt of balloon angioplasty but aborted due to unable to cross lesion.  He had antianginal was titrated and was placed on increased dose of Imdur  as well as amlodipine  5 mg.  Decision was made to not start Ranexa  due to history of bradycardia.  TTE was completed on 02/04/2024 with EF of 60-65% and grade 1 DD and no significant valve abnormalitie as a walk-in patient he presented to the office on 02/06/2024 and was seen by Dr. Berry Savage with metoprolol  25 mg and Ranexa  500 mg twice daily added to regimen for chest pain.   Patient denies chest pain, palpitations, dyspnea, PND, orthopnea, nausea, vomiting, dizziness, syncope, edema, weight gain, or early satiety.   Discussed the use of AI scribe software for  clinical note transcription with the patient, who gave verbal consent to proceed.  History of Present Illness    ***Notes:   Review of Systems  Please see the history of present illness.    All other systems reviewed and are otherwise negative except as noted above.  Physical Exam     Wt Readings from Last 3 Encounters:  02/02/24 188 lb (85.3 kg)  01/28/24 188 lb 9.6 oz (85.5 kg)  09/18/22 175 lb (79.4 kg)   WU:JWJXB were no vitals filed for this visit.,There is no height or weight on file to calculate BMI. GEN: Well nourished, well developed in no  acute distress Neck: No JVD; No carotid bruits Pulmonary: Clear to auscultation without rales, wheezing or rhonchi  Cardiovascular: Normal rate. Regular rhythm. Normal S1. Normal S2.   Murmurs: There is no murmur.  ABDOMEN: Soft, non-tender, non-distended EXTREMITIES:  No edema; No deformity   EKG/LABS/ Recent Cardiac Studies   ECG personally reviewed by me today - ***  Risk Assessment/Calculations:   {Does this patient have ATRIAL FIBRILLATION?:[phone]}      Lab Results  Component Value Date   WBC 6.9 02/04/2024   HGB 12.2 (L) 02/04/2024   HCT 35.9 (L) 02/04/2024   MCV 92.3 02/04/2024   PLT 182 02/04/2024   Lab Results  Component Value Date   CREATININE 1.18 02/03/2024   BUN 9 02/03/2024   NA 141 02/03/2024   K 3.6 02/03/2024   CL 107 02/03/2024   CO2 24 02/03/2024   Lab Results  Component Value Date   CHOL 119 01/28/2024   HDL 38 (L) 01/28/2024   LDLCALC 53 01/28/2024   TRIG 165 (H) 01/28/2024   CHOLHDL 3.1 01/28/2024    Lab Results  Component Value Date   HGBA1C 5.2 01/28/2013   Assessment & Plan    Assessment and Plan Assessment & Plan     1.  History of CAD  2.  Paroxysmal atrial fibrillation  3.  Essential hypertension  4.  Hyperlipidemia      Disposition: Follow-up with Randene Bustard, MD or APP in *** months {Are you ordering a CV Procedure (e.g. stress test, cath, DCCV, TEE, etc)?   Press F2        :[id number]}   Signed, Francene Ing, Retha Cast, NP 02/17/2024, 1:22 PM [facility]

## 2024-02-19 ENCOUNTER — Ambulatory Visit: Attending: Nurse Practitioner | Admitting: Nurse Practitioner

## 2024-02-19 ENCOUNTER — Encounter: Payer: Self-pay | Admitting: Nurse Practitioner

## 2024-02-19 VITALS — BP 126/74 | HR 77 | Ht 65.0 in | Wt 192.6 lb

## 2024-02-19 DIAGNOSIS — I25118 Atherosclerotic heart disease of native coronary artery with other forms of angina pectoris: Secondary | ICD-10-CM | POA: Diagnosis not present

## 2024-02-19 DIAGNOSIS — I480 Paroxysmal atrial fibrillation: Secondary | ICD-10-CM | POA: Diagnosis not present

## 2024-02-19 DIAGNOSIS — I10 Essential (primary) hypertension: Secondary | ICD-10-CM | POA: Diagnosis not present

## 2024-02-19 DIAGNOSIS — E785 Hyperlipidemia, unspecified: Secondary | ICD-10-CM | POA: Diagnosis not present

## 2024-02-19 NOTE — Patient Instructions (Signed)
 Medication Instructions:  Your physician recommends that you continue on your current medications as directed. Please refer to the Current Medication list given to you today. *If you need a refill on your cardiac medications before your next appointment, please call your pharmacy*  Lab Work: FASTING LABS DUE THE FIRST WEEK OF JUNE-LIPIDS & LFTS If you have labs (blood work) drawn today and your tests are completely normal, you will receive your results only by: MyChart Message (if you have MyChart) OR A paper copy in the mail If you have any lab test that is abnormal or we need to change your treatment, we will call you to review the results.  Testing/Procedures: ZOXWRU SLEEP STUDY  Follow-Up: At [facility], you and your health needs are our priority.  As part of our continuing mission to provide you with exceptional heart care, our providers are all part of one team.  This team includes your primary Cardiologist (physician) and Advanced Practice Providers or APPs (Physician Assistants and Nurse Practitioners) who all work together to provide you with the care you need, when you need it.  Your next appointment:   3 month(s)  Provider:   Randene Bustard, MD or Charles Connor, NP   We recommend signing up for the patient portal called "MyChart".  Sign up information is provided on this After Visit Summary.  MyChart is used to connect with patients for Virtual Visits (Telemedicine).  Patients are able to view lab/test results, encounter notes, upcoming appointments, etc.  Non-urgent messages can be sent to your provider as well.   To learn more about what you can do with MyChart, go to [email or web address].   Other Instructions

## 2024-03-10 ENCOUNTER — Other Ambulatory Visit (HOSPITAL_COMMUNITY): Payer: Self-pay

## 2024-03-10 ENCOUNTER — Ambulatory Visit: Admitting: Cardiology

## 2024-03-11 ENCOUNTER — Other Ambulatory Visit (HOSPITAL_COMMUNITY): Payer: Self-pay

## 2024-03-13 ENCOUNTER — Telehealth: Payer: Self-pay | Admitting: Cardiology

## 2024-03-13 NOTE — Telephone Encounter (Signed)
 Pt c/o medication issue:  1. Name of Medication:   amLODipine  (NORVASC ) 5 MG tablet    2. How are you currently taking this medication (dosage and times per day)? a  3. Are you having a reaction (difficulty breathing--STAT)? no  4. What is your medication issue? Patient states the medication is causing him some bruising and doesn't want to continue with medication. Please advise

## 2024-03-13 NOTE — Telephone Encounter (Signed)
 Pt called to report that both of his ankles have been swollen, bruised, and painful... he saw his PCP and they thought he had gout but his "labs came back normal"... not sure if he had a Uric Acid level.   He thinks it is the Amlodipine  but does not explain the bruising and pain.... he cannot wear his normal shoes.   I advised him that the best thing is for him to be assessed and since it is the end of the day on Friday evening... he should go to an Urgent Care or try to reach his PCP.... he will try to hold hid Amlodipine  for a couple of days and monitor his BP and see if that helped to improve the edema.   If anything worsens he will go to an Urgent Care or call the MD on call... he will call me back on Monday and let me know if any improvement.   He will also keep them elevated.

## 2024-03-15 NOTE — Telephone Encounter (Signed)
 I do think he needs to be seen but holding amlodipine  make sense.

## 2024-03-15 NOTE — Telephone Encounter (Signed)
 Reasonable to hold amlodipine  for a few days to see if it works

## 2024-03-17 NOTE — Telephone Encounter (Signed)
 Patient is calling back because he states he is still swollen. Please advise

## 2024-03-17 NOTE — Telephone Encounter (Signed)
 Spoke to patient he stated swelling in lower legs and feet  improving.Stated B/P ranging in 140's /70 to 80.Pulse 70's.Advised I will send message to Dr.[name] for advice.

## 2024-03-20 MED ORDER — SPIRONOLACTONE 25 MG PO TABS: 25.0000 mg | ORAL_TABLET | Freq: Every day | ORAL | 3 refills | Status: DC

## 2024-03-20 NOTE — Telephone Encounter (Signed)
 Spoke to patient Dr.[name]'s advice given.He will start Spironolactone 25 mg daily.Advised to call back if B/P continues to be elevated and if still has swelling in lower legs.

## 2024-03-20 NOTE — Telephone Encounter (Signed)
 Lets try starting spironolactone 25 mg daily which will help with blood pressure and reduce some of the swelling.  We can then try to get him back on 2.5 of amlodipine  if he tolerates the spironolactone.  Randene Bustard, MD

## 2024-03-23 ENCOUNTER — Other Ambulatory Visit: Payer: Self-pay

## 2024-03-23 DIAGNOSIS — I10 Essential (primary) hypertension: Secondary | ICD-10-CM

## 2024-03-23 DIAGNOSIS — I480 Paroxysmal atrial fibrillation: Secondary | ICD-10-CM

## 2024-03-23 DIAGNOSIS — I25118 Atherosclerotic heart disease of native coronary artery with other forms of angina pectoris: Secondary | ICD-10-CM

## 2024-03-23 DIAGNOSIS — E785 Hyperlipidemia, unspecified: Secondary | ICD-10-CM

## 2024-04-01 ENCOUNTER — Telehealth: Payer: Self-pay | Admitting: Cardiology

## 2024-04-01 NOTE — Telephone Encounter (Addendum)
 Swelling in legs ever since being in hospital. He reports that since stopping Amlodipine  and starting the spironolactone , it has not helped with the swelling. Swelling midway up to his knee. Swelling goes down overnight, then comes back during the day.   Asked if he could start keeping a daily weight. Feels like he can get a scale and start weighing every morning.   Recommended he decrease sodium intake, get some compression socks from medical supply store to wear daily, cut back on alcohol intake.   He reports that he Does Not have any new shortness of breath- it is the same as it has been, no worsening.   Informed him that I will send this information to his provider to see if he would like to make any other changes.

## 2024-04-01 NOTE — Telephone Encounter (Signed)
 Pt c/o swelling/edema: STAT if pt has developed SOB within 24 hours  If swelling, where is the swelling located? Legs, very painful  How much weight have you gained and in what time span? Has not checked  Have you gained 2 pounds in a day or 5 pounds in a week? Not sure  Do you have a log of your daily weights (if so, list)? no  Are you currently taking a fluid pill? yes  Are you currently SOB? yes  Have you traveled recently in a car or plane for an extended period of time? no

## 2024-04-02 NOTE — Telephone Encounter (Signed)
 Probably needs to be evaluated.  Would probably end up being an APP. I would suggest Lasix 20 mg and take 40 mg for 4 days then reduce to 20 mg daily.  Take additional dose as needed for weight gain more than 3 pounds or worsening swelling  New Rx will be Lasix 20 mg daily as directed; dispense #40 tabs, 12 refills => if this becomes a chronic medication we can change to 90 days.  DH

## 2024-04-03 MED ORDER — FUROSEMIDE 20 MG PO TABS
20.0000 mg | ORAL_TABLET | Freq: Two times a day (BID) | ORAL | 12 refills | Status: AC
  Filled 2024-09-28: qty 40, 20d supply, fill #0
  Filled 2024-10-27: qty 40, 20d supply, fill #1

## 2024-04-03 NOTE — Telephone Encounter (Signed)
 Went over the information from Dr Genice previous note. He verbalized understanding. He has an appt with Dr Anner 05/15/24. He will be notified if Dr Anner would like him seen by an APP before that scheduled appt.  RX sent to pt's pharmacy

## 2024-04-07 NOTE — Telephone Encounter (Signed)
 If he is truly doing better, we can wait/see him in the clinic, but otherwise we should get him into see an APP.

## 2024-04-15 ENCOUNTER — Telehealth: Payer: Self-pay | Admitting: *Deleted

## 2024-04-15 DIAGNOSIS — I25118 Atherosclerotic heart disease of native coronary artery with other forms of angina pectoris: Secondary | ICD-10-CM

## 2024-04-15 MED ORDER — RANOLAZINE ER 500 MG PO TB12: 500.0000 mg | ORAL_TABLET | Freq: Two times a day (BID) | ORAL | 2 refills | Status: DC

## 2024-04-15 NOTE — Telephone Encounter (Signed)
 Pt's wife walked in today stating pt needs a refill for his ranexa  sent into [facility] - advised I will send in as requested.

## 2024-04-28 ENCOUNTER — Encounter (HOSPITAL_BASED_OUTPATIENT_CLINIC_OR_DEPARTMENT_OTHER): Payer: Self-pay | Admitting: *Deleted

## 2024-04-28 ENCOUNTER — Emergency Department (HOSPITAL_BASED_OUTPATIENT_CLINIC_OR_DEPARTMENT_OTHER): Admission: EM | Admit: 2024-04-28 | Discharge: 2024-04-28 | Discharge status: Against Medical Advice

## 2024-04-28 ENCOUNTER — Other Ambulatory Visit: Payer: Self-pay

## 2024-04-28 DIAGNOSIS — J029 Acute pharyngitis, unspecified: Secondary | ICD-10-CM | POA: Diagnosis present

## 2024-04-28 DIAGNOSIS — Z5321 Procedure and treatment not carried out due to patient leaving prior to being seen by health care provider: Secondary | ICD-10-CM | POA: Insufficient documentation

## 2024-04-28 NOTE — ED Triage Notes (Signed)
 Patient to ED reporting Saturday he woke up feeling like something was stuck in his throat and had a sore throat. Since then pain has continued and patient is having trouble swallowing. No airway compromise noted in triage, patient able to speak and able to swallow food, drink and saliva at this time.

## 2024-05-11 ENCOUNTER — Other Ambulatory Visit: Payer: Self-pay | Admitting: Cardiology

## 2024-05-12 ENCOUNTER — Other Ambulatory Visit (HOSPITAL_COMMUNITY): Payer: Self-pay

## 2024-05-15 ENCOUNTER — Encounter: Payer: Self-pay | Admitting: Cardiology

## 2024-05-15 ENCOUNTER — Ambulatory Visit: Attending: Cardiology | Admitting: Cardiology

## 2024-05-15 VITALS — BP 132/68 | HR 60 | Resp 16 | Ht 65.0 in | Wt 184.6 lb

## 2024-05-15 DIAGNOSIS — Z716 Tobacco abuse counseling: Secondary | ICD-10-CM

## 2024-05-15 DIAGNOSIS — I2510 Atherosclerotic heart disease of native coronary artery without angina pectoris: Secondary | ICD-10-CM

## 2024-05-15 DIAGNOSIS — I480 Paroxysmal atrial fibrillation: Secondary | ICD-10-CM | POA: Diagnosis not present

## 2024-05-15 DIAGNOSIS — E785 Hyperlipidemia, unspecified: Secondary | ICD-10-CM | POA: Diagnosis not present

## 2024-05-15 DIAGNOSIS — I10 Essential (primary) hypertension: Secondary | ICD-10-CM

## 2024-05-15 DIAGNOSIS — Z9861 Coronary angioplasty status: Secondary | ICD-10-CM

## 2024-05-15 DIAGNOSIS — I25118 Atherosclerotic heart disease of native coronary artery with other forms of angina pectoris: Secondary | ICD-10-CM

## 2024-05-15 DIAGNOSIS — R600 Localized edema: Secondary | ICD-10-CM

## 2024-05-15 MED ORDER — SPIRONOLACTONE 25 MG PO TABS: 25.0000 mg | ORAL_TABLET | Freq: Every day | ORAL | 3 refills | Status: DC

## 2024-05-15 MED ORDER — RANOLAZINE ER 1000 MG PO TB12: 1000.0000 mg | ORAL_TABLET | Freq: Two times a day (BID) | ORAL | 3 refills | Status: DC

## 2024-05-15 MED ORDER — NITROGLYCERIN 0.4 MG SL SUBL: 0.4000 mg | SUBLINGUAL_TABLET | SUBLINGUAL | 3 refills | Status: DC | PRN

## 2024-05-15 NOTE — Assessment & Plan Note (Signed)
 Well-controlled BP today. -Because of mild lower extremity edema, will reintroduce spironolactone  25 mg daily and continue Toprol  XL 25 mg daily along with Imdur  60 mg daily

## 2024-05-15 NOTE — Assessment & Plan Note (Signed)
 Chronic stable angina in the setting of coronary artery disease with prior stent placement. RCA stent occluded, fills via left to right collaterals. Collateral circulation insufficient during exertion.= > Explained how angina due to supply-demand mismatch in coronary blood flow, exacerbated by exertion.    Additional stenting not advised due to bleeding risks with dual antiplatelet therapy. - Continue Toprol  25 mg daily along with Imdur  60 mg daily for antianginal benefit as his heart rate seems to be stable. - Increase Ranexa  to 1000 mg BID, especially on days of increased exertion.  (For microvascular disease) - Continue aspirin  81 mg for short course, but then eventually stopping just simply continue Xarelto  for atrial fibrillation. - Refill nitroglycerin  for use if chest pain does not resolve with rest.

## 2024-05-15 NOTE — Assessment & Plan Note (Signed)
 Briefly discussed full tobacco cessation.  He is working on it.  Not interested in medications to treat.

## 2024-05-15 NOTE — Assessment & Plan Note (Addendum)
 Atrial fibrillation managed with low-dose Toprol  25 mg daily for rate control and CVA prophylaxis with Xarelto ..  No recent episodes. Continued anticoagulation necessary to prevent stroke risk. - Continue Xarelto  20 mg daily, and eventually stop aspirin  at next visit - Continue low-dose Toprol  for rate control.  As long as he is not having symptoms of fatigue, we will help to try to continue to use low-dose beta-blocker.Eric Savage

## 2024-05-15 NOTE — Assessment & Plan Note (Signed)
 Trivial edema.  This is usually only intermittent. -Restart spironolactone  25 mg daily -Continue Lasix  20 mg PRN for worsening edema/PND, orthopnea, weight gain more than 3 pounds

## 2024-05-15 NOTE — Assessment & Plan Note (Signed)
 Occluded RCA stent but still has an existing Taxus stent in the LCx with some residual distal stenosis distal to the stent.  Is currently now on aspirin  plus Xarelto .  At follow-up visit we will simply stop aspirin  and continue Xarelto 

## 2024-05-15 NOTE — Progress Notes (Signed)
 Cardiology Office Note:  .   Date:  05/15/2024  ID:  Eric Savage, DOB [birth year], MRN [id number] PCP/Referring Provider: Cristopher Suzen HERO, NP  [facility] Providers Cardiologist:  Eric Clay, MD     Chief Complaint  Patient presents with   Follow-up    3 months   Coronary Artery Disease    He does have exertional angina, but only with vigorous exertion    Patient Profile: .     Eric Savage is an obese 58 y.o. male heavy smoker with a PMH notable for early onset CAD (LCx and RCA PCI in 2010) who presents here for 16-month follow-up.  He was originally referred at the request of Eric Suzen HERO, NP.  CAD: RCA and LCx PCI (2010), patent by cath in 2014.  =>  RCA stent occluded as of April 2028 with left to right collaterals. CRF's: HLD, smoking PAF Chronic Alcohol Use     Eric Savage was last seen on 02/19/2024 by Eric Alberts, NP for close post-cath follow-up.  He had actually been seen by [name] on 02/06/2024 (he started back Toprol  25 mg daily and Ranexa  500 mg twice daily.  He did note some ankle swelling at work.  Still smoking 5 cigarettes a day and trying to quit.  Suggested home sleep study.  Subjective  Discussed the use of AI scribe software for clinical note transcription with the patient, who gave verbal consent to proceed.  History of Present Illness  Eric Savage is a 58 year old male with coronary artery disease and atrial fibrillation who presents with exertional chest pain.  He experiences chest pain primarily during physical exertion, such as carrying garbage cans or walking up inclines. The pain subsides with rest. He has a history of coronary artery disease with previous stent placements in the right coronary artery and circumflex artery. One of his stents is closed, but his body has developed collateral circulation to compensate. Despite this, he experiences symptoms when exerting himself beyond a certain point.  He is  currently on several medications, including Eliquis for atrial fibrillation, metoprolol , and Ranexa . He mentions a previous prescription for amlodipine , which he does not recall taking. He also discusses a history of statin use, including atorvastatin  and rosuvastatin , with concerns about memory issues potentially related to these medications.  He reports occasional heart rate irregularities but has not experienced atrial fibrillation symptoms recently. He is on spironolactone  for blood pressure and swelling, which he ran out of a couple of weeks ago, and he notes some ankle swelling.  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 He is able to perform routine activities without significant issues unless engaging in rigorous physical tasks.  No waking up at night due to breathing difficulties and no recent atrial fibrillation episodes.   Cardiovascular ROS: positive for - chest pain, dyspnea on exertion, and this is only with significant exertion-carrying heavy items or overdoing it.  Trivial leg edema with some leg aching. negative for - edema, irregular heartbeat, orthopnea, palpitations, paroxysmal nocturnal dyspnea, rapid heart rate, shortness of breath, or lightheadedness, dizziness or wooziness, syncope or near syncope, TIA or amaurosis fugax.  Claudication     Objective   Medications - Xarelto  20 mg daily - Aspirin  81 mg daily - Ranexa  500 mg twice daily; Imdur  60 mg daily; - Metoprolol  succinate 25 mg daily - Rosuvastatin  40  mg daily - Nitroglycerin -as needed - Spironolactone  25 mg daily, has been out of it for couple weeks) -- Lasix  20 mg PRN -- PRN Seroquel  50 mg nightly sleep; as needed Xanax  0.5-1 mg as needed sleep/anxiety  Studies Reviewed: SABRA        Labs from 03/13/2024: Cr 1.02, NA 141, K+ 4.4.  Normal LFTs.  Unfortunately, no lipid panel LP(a) 165 on  02/03/2019 Lab Results  Component Value Date   CHOL 119 01/28/2024   HDL 38 (L) 01/28/2024   LDLCALC 53 01/28/2024   TRIG 165 (H) 01/28/2024   CHOLHDL 3.1 01/28/2024    ECHO: Normal LV size and function.  EF 65%.  No RWMA.  GR 1 DD.  Normal RV.  Mild LA dilation.  Structurally normal aortic and mitral valves.  Normal RAP.  (02/04/2024) CATH: Proximal LAD 60% (RFR 0.93).  Previously placed proximal to distal LCx stent widely patent.  Distal LCx 65% (RFR 0.98).  Proximal RCA 80% with 100% stenosis mid RCA stent.  Unsuccessful PTCA.  LVEDP 19 mm (02/03/2024) => plan was to replace ACE inhibitor with amlodipine  5 mg daily and increase Imdur  to 30 mg daily.  Low threshold for Ranexa .  Beta-blocker should be reduced or discontinued due to bradycardia.  (02/03/2024)    Myoview  09/18/2022: LOW RISK.  No ischemia or infarction.  EF 64%.  Risk Assessment/Calculations:    CHA2DS2-VASc Score = 2   This indicates a 2.2% annual risk of stroke. The patient's score is based upon: CHF History: 0 HTN History: 1 Diabetes History: 0 Stroke History: 0 Vascular Disease History: 1 Age Score: 0 Gender Score: 0        STOP-Bang Score:  7        Physical Exam:   VS:  BP 132/68 (BP Location: Left Arm, Patient Position: Sitting, Cuff Size: Large)   Pulse 60   Resp 16   Ht 5' 5 (1.651 m)   Wt 184 lb 9.6 oz (83.7 kg)   SpO2 97%   BMI 30.72 kg/m    Wt Readings from Last 3 Encounters:  05/15/24 184 lb 9.6 oz (83.7 kg)  02/19/24 192 lb 9.6 oz (87.4 kg)  02/02/24 188 lb (85.3 kg)    GEN: Well nourished, well groomed in no acute distress; mildly obese but otherwise healthy-appearing.;  Faint smell of cigarette. NECK: No JVD; No carotid bruits CARDIAC: Normal S1, S2; RRR, no murmurs, rubs, gallops RESPIRATORY:  Clear to auscultation without rales, wheezing or rhonchi ; nonlabored, good air movement. ABDOMEN: Soft, non-tender, non-distended EXTREMITIES:  No edema; No deformity      ASSESSMENT AND  PLAN: .    Problem List Items Addressed This Visit       Cardiology Problems   CAD S/P percutaneous coronary angioplasty (Chronic)   Occluded RCA stent but still has an existing Taxus stent in the LCx with some residual distal stenosis distal to the stent.  Is currently now on aspirin  plus Xarelto .  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 RCA stent occluded, fills via left to right collaterals. Collateral circulation insufficient during exertion.= > Explained  how angina due to supply-demand mismatch in coronary blood flow, exacerbated by exertion.    Additional stenting not advised due to bleeding risks with dual antiplatelet therapy. - Continue Toprol  25 mg daily along with Imdur  60 mg daily for antianginal benefit as his heart rate seems to be stable. - Increase Ranexa  to 1000 mg BID, especially on days of increased exertion.  (For microvascular disease) - Continue aspirin  81 mg for short course, but then eventually stopping just simply continue Xarelto  for atrial fibrillation. - Refill nitroglycerin  for use if chest pain does not resolve with rest.      Relevant Medications   spironolactone  (ALDACTONE ) 25 MG tablet   ranolazine  (RANEXA ) 1000 MG SR tablet   nitroGLYCERIN  (NITROSTAT ) 0.4 MG SL tablet   Other Relevant Orders   Hepatic function panel   Lipid panel   Essential hypertension (Chronic)   Well-controlled BP today. -Because of mild lower extremity edema, will reintroduce spironolactone  25 mg daily and continue Toprol  XL 25 mg daily along with Imdur  60 mg daily      Relevant Medications   spironolactone   (ALDACTONE ) 25 MG tablet   ranolazine  (RANEXA ) 1000 MG SR tablet   nitroGLYCERIN  (NITROSTAT ) 0.4 MG SL tablet   Hyperlipidemia with target low density lipoprotein (LDL) cholesterol less than 55 mg/dL (Chronic)   Most recent LDL was 53, pretty much at goal on current dose of rosuvastatin  40 [place] daily.  Unfortunately is now having signs of memory issues.   This was the case previously with atorvastatin .  Has been able to tolerate rosuvastatin  until recently.   Concerns about memory issues potentially related to statin use. Previous switch from atorvastatin  to rosuvastatin  due to similar concerns. - Initiate a one-month statin holiday to assess impact on memory. - Recheck lipid panel in 6 weeks to evaluate cholesterol levels off statin.  If additional therapy is required to avoid statin use, would like to consider potentially choosing Livalo if his insurance will pay for it versus at least initiating pravastatin both of which have a lower tendency for brain penetration.  Unfortunate do not think pravastatin will be as effective with lipid lowering.  In that case, would consider PCSK9 Hamiter or Leqvio.      Relevant Medications   spironolactone  (ALDACTONE ) 25 MG tablet   ranolazine  (RANEXA ) 1000 MG SR tablet   nitroGLYCERIN  (NITROSTAT ) 0.4 MG SL tablet   Other Relevant Orders   Hepatic function panel   Lipid panel   Paroxysmal atrial fibrillation (HCC) (Chronic)   Atrial fibrillation managed with low-dose Toprol  25 mg daily for rate control and CVA prophylaxis with Xarelto ..  No recent episodes. Continued anticoagulation necessary to prevent stroke risk. - Continue Xarelto  20 mg daily, and eventually stop aspirin  at next visit - Continue low-dose Toprol  for rate control.  As long as he is not having symptoms of fatigue, we will help to try to continue to use low-dose beta-blocker..      Relevant Medications   spironolactone  (ALDACTONE ) 25 MG tablet   ranolazine  (RANEXA ) 1000 MG SR  tablet   nitroGLYCERIN  (NITROSTAT ) 0.4 MG SL tablet     Other   Bilateral lower extremity edema (Chronic)   Trivial edema.  This is usually only intermittent. -Restart spironolactone  25 mg daily -Continue Lasix  20 mg PRN for worsening edema/PND, orthopnea, weight gain more than 3 pounds      Tobacco abuse counseling (Chronic)   Briefly discussed full tobacco cessation.  He is working on it.  Not interested in  medications to treat.          Follow-Up: Return in about 3 months (around 08/15/2024) for To discuss test results, [employer].  Total time spent: 33 min spent with patient + 26 min spent charting = 59 min I spent 59 minutes in the care of Eric Savage today including reviewing labs (1 minute), reviewing studies (5 minutes reviewing Echo films and we recheck in Films-), face to face time discussing treatment options (33), reviewing records from precath notes, post-cath discharge summary as well as notes from [name] and Eric Alberts, NP (7 minutes), 13 minutes minutes, and documenting in the encounter.     Signed, Eric MICAEL Clay, MD, MS Eric Savage, M.D., M.S. Interventional Chartered certified accountant  Pager # [phone]

## 2024-05-15 NOTE — Assessment & Plan Note (Signed)
 Most recent LDL was 53, pretty much at goal on current dose of rosuvastatin  40 [place] daily.  Unfortunately is now having signs of memory issues.   This was the case previously with atorvastatin .  Has been able to tolerate rosuvastatin  until recently.   Concerns about memory issues potentially related to statin use. Previous switch from atorvastatin  to rosuvastatin  due to similar concerns. - Initiate a one-month statin holiday to assess impact on memory. - Recheck lipid panel in 6 weeks to evaluate cholesterol levels off statin.  If additional therapy is required to avoid statin use, would like to consider potentially choosing Livalo if his insurance will pay for it versus at least initiating pravastatin both of which have a lower tendency for brain penetration.  Unfortunate do not think pravastatin will be as effective with lipid lowering.  In that case, would consider PCSK9 Hamiter or Leqvio.

## 2024-05-15 NOTE — Patient Instructions (Addendum)
 Medication Instructions:   Restart Spironolactone  daily   Increase Ranexa  to 1000 mg twice Daily   Do not take rouvsartan  for one month  if symptoms go away - call office to have medication change, if symptoms is still present  restart medication  *If you need a refill on your cardiac medications before your next appointment, please call your pharmacy*   Lab Work: fasting  Lipid and Hepatic panel  - 6 weeks    If you have labs (blood work) drawn today and your tests are completely normal, you will receive your results only by: MyChart Message (if you have MyChart) OR A paper copy in the mail If you have any lab test that is abnormal or we need to change your treatment, we will call you to review the results.   Testing/Procedures:  Not needed  Follow-Up: At [facility], you and your health needs are our priority.  As part of our continuing mission to provide you with exceptional heart care, we have created designated Provider Care Teams.  These Care Teams include your primary Cardiologist (physician) and Advanced Practice Providers (APPs -  Physician Assistants and Nurse Practitioners) who all work together to provide you with the care you need, when you need it.     Your next appointment:   3 month(s)  The format for your next appointment:   In Person  Provider:   Alm Clay, MD or E.Wyn NP    Other Instructions

## 2024-06-16 ENCOUNTER — Other Ambulatory Visit: Payer: Self-pay | Admitting: Cardiology

## 2024-07-07 ENCOUNTER — Telehealth: Payer: Self-pay

## 2024-07-07 NOTE — Telephone Encounter (Signed)
 Ordering provider: Jackee Wyn Raddle., NP Associated diagnoses:  Coronary artery disease of native artery of native heart with stable angina pectoris PAF (paroxysmal atrial fibrillation) Hyperlipidemia with target low density lipoprotein (LDL) cholesterol Essential hypertension WatchPAT PA obtained on 07/07/2024 by Lucie DELENA Ku, CMA. Authorization: No prior authorization is required per Generic Worker's Comp Patient notified of PIN (1234) on 07/07/2024 via Notification Method: MyChart message.  Phone note routed to covering staff for follow-up.

## 2024-07-16 NOTE — Telephone Encounter (Signed)
 Received a call back from the patient stating he does not want to do the Itamar. He states he does not have the equipment.   Advised the patient that I would send a message to the sleep coordinators to see what the next step is if there is any. Patient voiced understanding.

## 2024-07-16 NOTE — Telephone Encounter (Signed)
 Left message for the patient requesting that he wear his Itamar within the next 30 days and if he is not interested then just bring the equipment back to the office.

## 2024-07-17 NOTE — Telephone Encounter (Signed)
**Note De-Identified Kentrell Hallahan Obfuscation** I called the pt but got no answer so I left a message on his VM (Ok per [facility]) asking him to call Macario back at [employer] office at [facility] at [phone] to discuss the WatchPAT One-HST that was ordered at his 02/19/24 office visit with [name], NP.

## 2024-07-20 NOTE — Telephone Encounter (Signed)
 That is fine if he does not want to do it.  I do not think he fully understands the reasoning behind doing it.  There are other ways of treating sleep apnea besides using CPAP.  It can be discussed with him that I see him in follow-up.  Alm Clay, MD

## 2024-07-20 NOTE — Telephone Encounter (Signed)
**Note De-Identified Nevyn Bossman Obfuscation** I called the pt to discuss the Itamar-HST that [name], NP ordered at his 02/19/24 office visit. The pt stated that he does not want to do this home sleep study or any other kind of sleep study as he states that even if he has sleep apnea he will not use a CPAP machine.  Forwarding this message to [name], NP and Dr Anner as Eric Savage.

## 2024-08-12 ENCOUNTER — Other Ambulatory Visit: Payer: Self-pay

## 2024-08-12 DIAGNOSIS — Z79899 Other long term (current) drug therapy: Secondary | ICD-10-CM

## 2024-08-12 DIAGNOSIS — E785 Hyperlipidemia, unspecified: Secondary | ICD-10-CM

## 2024-08-13 ENCOUNTER — Ambulatory Visit: Payer: Self-pay | Admitting: Cardiology

## 2024-08-13 DIAGNOSIS — E785 Hyperlipidemia, unspecified: Secondary | ICD-10-CM

## 2024-08-13 DIAGNOSIS — Z79899 Other long term (current) drug therapy: Secondary | ICD-10-CM

## 2024-08-13 DIAGNOSIS — I2510 Atherosclerotic heart disease of native coronary artery without angina pectoris: Secondary | ICD-10-CM

## 2024-08-13 LAB — LIPID PANEL
Chol/HDL Ratio: 5.6 ratio — ABNORMAL HIGH (ref 0.0–5.0)
Cholesterol, Total: 218 mg/dL — ABNORMAL HIGH (ref 100–199)
HDL: 39 mg/dL — ABNORMAL LOW (ref >39)
LDL Chol Calc (NIH): 138 mg/dL — ABNORMAL HIGH (ref 0–99)
Triglycerides: 230 mg/dL — ABNORMAL HIGH (ref 0–149)
VLDL Cholesterol Cal: 41 mg/dL — ABNORMAL HIGH (ref 5–40)

## 2024-08-13 LAB — HEPATIC FUNCTION PANEL
ALT: 15 IU/L (ref 0–44)
AST: 12 IU/L (ref 0–40)
Albumin: 4.2 g/dL (ref 3.8–4.9)
Alkaline Phosphatase: 81 IU/L (ref 47–123)
Bilirubin Total: 0.4 mg/dL (ref 0.0–1.2)
Bilirubin, Direct: 0.18 mg/dL (ref 0.00–0.40)
Total Protein: 6.1 g/dL (ref 6.0–8.5)

## 2024-08-14 ENCOUNTER — Encounter: Payer: Self-pay | Admitting: Cardiology

## 2024-08-14 ENCOUNTER — Ambulatory Visit: Attending: Cardiology | Admitting: Cardiology

## 2024-08-14 VITALS — BP 146/90 | HR 53 | Ht 65.0 in | Wt 194.0 lb

## 2024-08-14 DIAGNOSIS — Z716 Tobacco abuse counseling: Secondary | ICD-10-CM

## 2024-08-14 DIAGNOSIS — I25118 Atherosclerotic heart disease of native coronary artery with other forms of angina pectoris: Secondary | ICD-10-CM | POA: Diagnosis not present

## 2024-08-14 DIAGNOSIS — I480 Paroxysmal atrial fibrillation: Secondary | ICD-10-CM | POA: Diagnosis not present

## 2024-08-14 DIAGNOSIS — E669 Obesity, unspecified: Secondary | ICD-10-CM

## 2024-08-14 DIAGNOSIS — Z9861 Coronary angioplasty status: Secondary | ICD-10-CM

## 2024-08-14 DIAGNOSIS — I209 Angina pectoris, unspecified: Secondary | ICD-10-CM

## 2024-08-14 DIAGNOSIS — E785 Hyperlipidemia, unspecified: Secondary | ICD-10-CM

## 2024-08-14 DIAGNOSIS — I10 Essential (primary) hypertension: Secondary | ICD-10-CM | POA: Diagnosis not present

## 2024-08-14 DIAGNOSIS — R079 Chest pain, unspecified: Secondary | ICD-10-CM

## 2024-08-14 DIAGNOSIS — R600 Localized edema: Secondary | ICD-10-CM

## 2024-08-14 DIAGNOSIS — I2510 Atherosclerotic heart disease of native coronary artery without angina pectoris: Secondary | ICD-10-CM

## 2024-08-14 MED ORDER — ISOSORBIDE MONONITRATE ER 60 MG PO TB24: 60.0000 mg | ORAL_TABLET | Freq: Every day | ORAL | 3 refills | Status: DC

## 2024-08-14 MED ORDER — ROSUVASTATIN CALCIUM 40 MG PO TABS
40.0000 mg | ORAL_TABLET | Freq: Every day | ORAL | 3 refills | Status: AC
  Filled 2024-09-28: qty 90, 90d supply, fill #0
  Filled 2024-09-30: qty 30, 30d supply, fill #0
  Filled 2024-10-27: qty 30, 30d supply, fill #1

## 2024-08-14 MED ORDER — METOPROLOL TARTRATE 50 MG PO TABS
50.0000 mg | ORAL_TABLET | ORAL | 3 refills | Status: AC | PRN
  Filled 2024-09-28: qty 20, 20d supply, fill #0
  Filled 2024-10-27: qty 20, 20d supply, fill #1

## 2024-08-14 MED ORDER — VALSARTAN 40 MG PO TABS
40.0000 mg | ORAL_TABLET | Freq: Every day | ORAL | 3 refills | Status: AC
  Filled 2024-09-28: qty 90, 90d supply, fill #0
  Filled 2024-09-30: qty 30, 30d supply, fill #0
  Filled 2024-10-27: qty 30, 30d supply, fill #1

## 2024-08-14 NOTE — Progress Notes (Signed)
 Cardiology Office Note:  .   Date:  08/16/2024  ID:  Eric Savage, DOB 1966-09-12, MRN [id number] PCP: Cristopher Suzen HERO, NP  [facility] Providers Cardiologist:  Alm Clay, MD     Chief Complaint  Patient presents with   Follow-up   Coronary Artery Disease    Noting exertional angina and dyspnea.  With more than usual exertion.    Patient Profile: .     Eric Savage is a obese 58 y.o. male heavy smoker with a PMH notable for early onset CAD (reviewed below) with CRF's of HLD, HTN and smoking with recent diagnosis of Paroxysmal A-fib who presents for 75-month follow-up with worsening anginal symptoms, and intermittent A-fib.    PMH  CAD: RCA and LCx PCI (2010), patent by cath in 2014.  =>  RCA stent occluded as of April 2025 with left to right collaterals.  Unable to cross with a wire. CRF's: HLD, smoking PAF (new diagnosis) Chronic Alcohol Use     Eric Savage was last seen on 05/15/2024 for routine follow-up.  Having some exertional angina but not significant.  We decided to stop his aspirin  and continue Xarelto .  We reintroduce spironolactone  and continue Toprol  XL 25 mg daily along with Imdur .  He was on Ranexa  500 mg twice daily was increased with thousand 1 g twice daily after his failed attempt at PCI.  As far as he can tell he had not any breakthrough spells of A-fib but was on the beta-blocker and Xarelto .  He was working on smoking cessation but not yet ready to quit.  He is on PRN Lasix  for edema.He was having a lot of memory issues so we decided to do a statin holiday since his lipids were really well-controlled on the 40 mg rosuvastatin .    Subjective  Discussed the use of AI scribe software for clinical note transcription with the patient, who gave verbal consent to proceed.  History of Present Illness Eric Savage is a 58 year old male with atrial fibrillation and coronary artery disease who presents with recurrent AFib episodes and  angina.  He has experienced two episodes of atrial fibrillation since his last visit. The first episode occurred six weeks ago and lasted approximately an hour and a half, while the second episode occurred last Saturday and lasted about an hour. He manages these episodes with metoprolol  tartrate and alprazolam , which usually alleviate the symptoms. Anxiety appears to trigger these episodes.  He denies recent angina symptoms except during AFib episodes, which he manages with nitroglycerin . He experiences chest pain with routine activities such as taking out the garbage and during work duties, which involve walking around intersections. The chest pain resolves with rest or nitroglycerin . No shortness of breath when lying flat or waking up at night, and no history of syncope or dizziness. He reports no swelling in his legs.  He has a history of coronary artery disease and underwent a heart catheterization in April, during which a stent was placed. He is currently on ranolazine  1000 mg twice daily and metoprolol  succinate 25 mg daily. He has been off rosuvastatin  due to memory issues, which have not improved since discontinuation.  He experiences daytime sleepiness and grogginess upon waking, waking up several times a night, and sleeping for only about an hour and a half at a time. He denies falling asleep during conversations or while driving.  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 He continues to smoke. He has a family history of cardiovascular issues, as his father has a blocked carotid artery.   Cardiovascular ROS: positive for - chest pain, dyspnea on exertion, and this is usually with more than usual exertion.  Not every time.  But if he walks a long distance or does more strenuous activity (taking out his garbage).  He has also gained quite a bit of weight  recently. negative for - edema, irregular heartbeat, orthopnea, palpitations, paroxysmal nocturnal dyspnea, rapid heart rate, shortness of breath, or he does get some lightheadedness but no syncope/near syncope or TIA/amaurosis fugax; no claudication.  ROS:  Review of Systems - Negative except symptoms noted above.    Objective   Current Meds  Medication Sig   acetaminophen  (TYLENOL ) 500 MG tablet Take 1,000 mg by mouth as needed for moderate pain (pain score 4-6).   ALPRAZolam  (XANAX ) 0.5 MG tablet Take 0.5-1 mg by mouth as needed for anxiety.   aspirin  EC 81 MG tablet Take 81 mg by mouth at bedtime.   furosemide  (LASIX ) 20 MG tablet Take 40 mg for 4 days then reduce to 20 mg daily.  Take additional dose of 20 mg as needed for weight gain more than 3 pounds or worsening swelling.   metoprolol  succinate (TOPROL -XL) 25 MG 24 hr tablet Take 1 tablet (25 mg total) by mouth daily.   nitroGLYCERIN  (NITROSTAT ) 0.4 MG SL tablet Place 1 tablet (0.4 mg total) under the tongue every 5 (five) minutes as needed for chest pain.   omeprazole (PRILOSEC) 40 MG capsule Take 40 mg by mouth at bedtime.    QUEtiapine  (SEROQUEL ) 50 MG tablet Take 50 mg by mouth at bedtime.   ranolazine  (RANEXA ) 1000 MG SR tablet Take 1 tablet (1,000 mg total) by mouth 2 (two) times daily.   rivaroxaban  (XARELTO ) 20 MG TABS tablet Take 1 tablet (20 mg total) by mouth daily with supper.   spironolactone  (ALDACTONE ) 25 MG tablet Take 1 tablet (25 mg total) by mouth daily.   [Refill] isosorbide  mononitrate (IMDUR ) 60 MG 24 hr tablet Take 1 tablet (60 mg total) by mouth daily.   [On hold-restarted] rosuvastatin  (CRESTOR ) 40 MG tablet Take 1 tablet (40 mg total) by mouth daily.;  Had been on hold.  Not currently taking.  Restarting today.    New Additions  metoprolol  tartrate (LOPRESSOR ) 50 MG tablet Take 1 tablet (50 mg total) by mouth as needed. May use as needed for breakthrough afib.    valsartan (DIOVAN) 40 MG tablet Take 1  tablet (40 mg total) by mouth daily.    Studies Reviewed: Eric Savage   EKG Interpretation Date/Time:  Friday August 14 2024 15:40:25 EST Ventricular Rate:  53 PR Interval:  142 QRS Duration:  78 QT Interval:  480 QTC Calculation: 450 R Axis:   59  Text Interpretation: Sinus bradycardia Nonspecific ST and T wave abnormality When compared with ECG of 03-Feb-2024 16:00, Nonspecific T wave abnormality, worse in Anterior leads Confirmed by Eric Savage ([id number]) on 08/14/2024 4:04:43 PM    Lab Results  Component Value Date   CHOL 218 (H) 08/12/2024   HDL 39 (L) 08/12/2024   LDLCALC 138 (H) 08/12/2024   TRIG 230 (H) 08/12/2024   CHOLHDL 5.6 (H) 08/12/2024  - > Indicates statin holiday. Lab Results  Component Value Date   NA 141 02/03/2024   K 3.6 02/03/2024   CREATININE 1.18 02/03/2024  GFRNONAA >60 02/03/2024   GLUCOSE 112 (H) 02/03/2024   Lab Results  Component Value Date   WBC 6.9 02/04/2024   HGB 12.2 (L) 02/04/2024   HCT 35.9 (L) 02/04/2024   MCV 92.3 02/04/2024   PLT 182 02/04/2024   Lab Results  Component Value Date   HGBA1C 5.2 01/28/2013    Results  DIAGNOSTIC Myoview  09/18/2022: LOW RISK.  No ischemia or infarction.  EF 64%.  Cardiac catheterization:  Proximal LAD 60% (RFR 0.93).  Previously placed proximal to distal LCx stent widely patent.  Distal LCx 65% (RFR 0.98).  Proximal RCA 80% with 100% stenosis mid RCA stent.  Unsuccessful PTCA. (Unable to cross RCA; RFR measurement of LAD nonobstructive) LVEDP 19 mm (02/03/2024) => plan was to replace ACE inhibitor with amlodipine  5 mg daily and increase Imdur  to 30 mg daily.  Low threshold for Ranexa .  Beta-blocker should be reduced or discontinued due to bradycardia.  (02/03/2024)   Echocardiogram: Normal LV size and function.  EF 65%.  No RWMA.  GR 1 DD.  Normal RV.  Mild LA dilation.  Structurally normal aortic and mitral valves.  Normal RAP.  (02/04/2024)   Risk Assessment/Calculations:    CHA2DS2-VASc Score = 2    This indicates a 2.2% annual risk of stroke. The patient's score is based upon: CHF History: 0 HTN History: 1 Diabetes History: 0 Stroke History: 0 Vascular Disease History: 1 Age Score: 0 Gender Score: 0    HYPERTENSION CONTROL Vitals:   08/14/24 1536 08/14/1549  BP: (!) 150/94 (!) 146/90    The patient's blood pressure is elevated above target today.  In order to address the patient's elevated BP: A new medication was prescribed today. (Adding valsartan 40 mg daily)      STOP-Bang Score:  7      -=> Does not necessarily complain of daytime sleepiness.  Physical Exam:   VS:  BP (!) 146/90   Pulse (!) 53   Ht 5' 5 (1.651 m)   Wt 194 lb (88 kg)   SpO2 94%   BMI 32.28 kg/m    Wt Readings from Last 3 Encounters:  08/14/24 194 lb (88 kg)  05/15/24 184 lb 9.6 oz (83.7 kg)  02/19/24 192 lb 9.6 oz (87.4 kg)      GEN: Well nourished, well groomed; in no acute distress, but seems a little bit tired; for the most part healthy-appearing.  Still has a faint smell of cigarettes NECK: No JVD; No carotid bruits CARDIAC: Normal S1, S2; RRR, no murmurs, rubs, gallops RESPIRATORY:  Clear to auscultation without rales, wheezing or rhonchi ; nonlabored, good air movement. ABDOMEN: Soft, non-tender, non-distended EXTREMITIES:  No edema; No deformity      ASSESSMENT AND PLAN: .    Problem List Items Addressed This Visit       Cardiology Problems   Angina, class III (Chronic)   Anginal symptoms seem to be getting worse, more prominent.  Starting to limit his ability to do his job. He is already on Ranexa  1000 mg twice daily, Imdur  60 mg and Toprol  25 mg.  I am adding afterload reduction with valsartan 40 midodrine daily in addition to spironolactone .  Will asked Dr. Jordan to review cath films and extend the possibility of attempted CTO PCI.      Relevant Medications   metoprolol  tartrate (LOPRESSOR ) 50 MG tablet   rosuvastatin  (CRESTOR ) 40 MG tablet   valsartan (DIOVAN) 40  MG tablet   isosorbide  mononitrate (IMDUR ) 60 MG  24 hr tablet   CAD S/P percutaneous coronary angioplasty (Chronic)   RCA stent occluded but the LCx stent is still patent.  Moderate disease in the LAD that was RFR negative. There were a significant mount of left-to-right collaterals to the RCA and I will ask Dr. Jordan to review images to consider the possibility of CTO PCI, since he is now having more anginal symptoms.  - No longer on aspirin  or Plavix  as he is on Xarelto  for A-fib.       Relevant Medications   metoprolol  tartrate (LOPRESSOR ) 50 MG tablet   rosuvastatin  (CRESTOR ) 40 MG tablet   valsartan (DIOVAN) 40 MG tablet   isosorbide  mononitrate (IMDUR ) 60 MG 24 hr tablet   Other Relevant Orders   EKG 12-Lead (Completed)   Coronary artery disease of native heart with stable angina pectoris - Primary (Chronic)   Intermittent angina during routine activities-angina Class II- progressing more toward Class III.Eric Savage  Previous catheterization showed 100% RCA and 85% artery blockage. Consider CTO PCI if symptoms worsen. Discussed stenting risks including bleeding due to dual antiplatelet therapy. - Consult Dr. Jordan for potential CTO PCI for RCA. - Continue current antianginal regimen with of Ranexa  1000 g twice daily, Imdur  60 mg daily and Toprol  XL 25 mg daily.  - He has as needed NTG, which he uses sparingly  - If nitroglycerin  needed, double isosorbide  mononitrate for two days. -His usual PRN management is taking his existing 25 mg metoprolol  tartrate tablets x 2 along with his alprazolam  and that usually helps his angina to go away.  - Will provide PRN prescription of metoprolol  tartrate 50 mg for either breakthrough A-fib or breakthrough spells of angina. -Add valsartan 40 mL daily for additional afterload reduction since he is having some symptoms to suggest diastolic dysfunction with mediated dyspnea on exertion  - Continue standing dose of furosemide  with as needed dosing for weight  gain or worsening dyspnea.      Relevant Medications   metoprolol  tartrate (LOPRESSOR ) 50 MG tablet   rosuvastatin  (CRESTOR ) 40 MG tablet   valsartan (DIOVAN) 40 MG tablet   isosorbide  mononitrate (IMDUR ) 60 MG 24 hr tablet   Other Relevant Orders   EKG 12-Lead (Completed)   Essential hypertension (Chronic)   Interestingly we have had to go up and down on meds in the past.  His blood pressure has gotten higher of late and he is having symptoms of some diastolic dysfunction/diastolic heart failure. Currently only on Toprol  XL 25 mg along with spironolactone  25 mg recently added. - Continue current meds and add valsartan 40 mg daily - Encouraged use of furosemide  daily if necessary and for worsening edema.      Relevant Medications   metoprolol  tartrate (LOPRESSOR ) 50 MG tablet   rosuvastatin  (CRESTOR ) 40 MG tablet   valsartan (DIOVAN) 40 MG tablet   isosorbide  mononitrate (IMDUR ) 60 MG 24 hr tablet   Hyperlipidemia with target low density lipoprotein (LDL) cholesterol less than 55 mg/dL (Chronic)   Lipids were well-controlled when he was on his rosuvastatin  but now having held it with a statin holiday, repeat labs showed significant worsening of lipids.  He did not have any improvement of his memory issues, therefore not likely be related to statin. - Restart rosuvastatin  40 mg daily.  - Recheck labs in roughly 3 months.      Relevant Medications   metoprolol  tartrate (LOPRESSOR ) 50 MG tablet   rosuvastatin  (CRESTOR ) 40 MG tablet   valsartan (DIOVAN) 40 MG tablet  isosorbide  mononitrate (IMDUR ) 60 MG 24 hr tablet   Other Relevant Orders   EKG 12-Lead (Completed)   Paroxysmal atrial fibrillation (HCC) (Chronic)   Increased frequency of AFib episodes, associated with anxiety. Considering sleep apnea as a factor. Valsartan added for BP and AFib management.  - Continue metoprolol  succinate 25 mg daily.  - Prescribed metoprolol  tartrate 50 mg for breakthrough AFib, 20 tablets with 3  refills. - Started valsartan 40 mg for BP control and AFib management. - Consider sleep study if AFib persists.      Relevant Medications   metoprolol  tartrate (LOPRESSOR ) 50 MG tablet   rosuvastatin  (CRESTOR ) 40 MG tablet   valsartan (DIOVAN) 40 MG tablet   isosorbide  mononitrate (IMDUR ) 60 MG 24 hr tablet   Other Relevant Orders   EKG 12-Lead (Completed)     Other   Bilateral lower extremity edema (Chronic)   He does not have much edema.  We restarted spironolactone  last visit and gave PRN Lasix .  I recommend that he take it for couple days and then go back to using PRN based on weight changes of 3 pounds.      Obesity (BMI 30-39.9) (Chronic)   Unfortunately he has gained some weight back.  So it may be volume and send may just be due to dietary indiscretion and less activity.  He is no longer as active with work.  The patient understands the need to lose weight with diet and exercise. We have discussed specific strategies for this.      Tobacco abuse counseling (Chronic)   Smoking cessation instruction/counseling given:  counseled patient on the dangers of tobacco use, advised patient to stop smoking, and reviewed strategies to maximize success             Informed Consent   Shared Decision Making/Informed Consent The risks [stroke (1 in 1000), death (1 in 1000), kidney failure [usually temporary] (1 in 500), bleeding (1 in 200), allergic reaction [possibly serious] (1 in 200)], benefits (diagnostic support and management of coronary artery disease) and alternatives of a cardiac catheterization were discussed in detail with Mr. Stellmach and he is willing to proceed if Dr. Jordan feels that he may be able to perform CTO PCI of the RCA.      Follow-Up: Return in about 2 months (around 10/14/2024).  I spent 50 minutes in the care of Eric Savage today including reviewing labs (2 minutes), reviewing studies (I reviewed his recent echocardiogram as well as cath films-5  minutes), face to face time discussing treatment options (29 minutes discussing his symptoms as well as ongoing management.  We discussed the potential for cardiac catheterization.  We also talked about possible OSA evaluation.), reviewing records from last clinic notes (2 minutes), 11 minutes dictating, with clinical decision making, and documenting in the encounter.      Signed, Alm MICAEL Clay, MD, MS Alm Clay, M.D., M.S. Interventional Cardiologist  [facility] Pager # [phone]

## 2024-08-14 NOTE — Patient Instructions (Addendum)
 Medication Instructions:   Restart Rosuvastatin  40 mg daily    Start Valsartan 40 mg daily    May use Metoprolol  tartrate 50 mg  as need for breakthrough  Afib.  *If you need a refill on your cardiac medications before your next appointment, please call your pharmacy*   Lab Work: Not needed    Testing/Procedures: Not needed   Follow-Up: At [facility], you and your health needs are our priority.  As part of our continuing mission to provide you with exceptional heart care, we have created designated Provider Care Teams.  These Care Teams include your primary Cardiologist (physician) and Advanced Practice Providers (APPs -  Physician Assistants and Nurse Practitioners) who all work together to provide you with the care you need, when you need it.     Your next appointment:   2 month(s)  The format for your next appointment:   In Person  Provider:   Alm Clay, MD   Other Instructions

## 2024-08-14 NOTE — Progress Notes (Deleted)
 Cardiology Office Note:  .   Date:  08/14/2024  ID:  Eric Savage, DOB [birth year], MRN [id number] PCP: Cristopher Suzen HERO, NP  [facility] Providers Cardiologist:  Alm Clay, MD { Click to update primary MD,subspecialty MD or APP then REFRESH:1}    No chief complaint on file.   Patient Profile: .     Eric Savage is a *** 58 y.o. male *** with a PMH notable for *** who presents here for *** at the request of Cristopher Suzen HERO, NP.  Eric Savage is an obese 58 y.o. male heavy smoker with a PMH notable for early onset CAD (LCx and RCA PCI in 2010) who presents here for 14-month follow-up.  He was originally referred at the request of Cristopher Suzen HERO, NP.  CAD: RCA and LCx PCI (2010), patent by cath in 2014.  =>  RCA stent occluded as of April 2028 with left to right collaterals. CRF's: HLD, smoking PAF Chronic Alcohol Use        Eric Savage was last seen on ***  Subjective  Discussed the use of AI scribe software for clinical note transcription with the patient, who gave verbal consent to proceed.  History of Present Illness      Cardiovascular ROS: {roscv:310661}  ROS:  Review of Systems - {ros master:310782}    Objective    Studies Reviewed: .        Results  ECHO: *** CATH: *** MONITOR: *** CT: ***  Risk Assessment/Calculations:   {Does this patient have ATRIAL FIBRILLATION?:[id number]} The patient's 1st BP is elevated (>139/89)*** Repeat BP and {Click to enter a 2nd BP Refresh Note  :1}   STOP-Bang Score:  7  { Consider Dx Sleep Disordered Breathing or Sleep Apnea  ICD G47.33          :1}      Physical Exam:   VS:  BP (!) 150/94   Pulse (!) 53   Ht 5' 5 (1.651 m)   Wt 194 lb (88 kg)   SpO2 94%   BMI 32.28 kg/m    Wt Readings from Last 3 Encounters:  08/14/24 194 lb (88 kg)  05/15/24 184 lb 9.6 oz (83.7 kg)  02/19/24 192 lb 9.6 oz (87.4 kg)    Physical Exam    GEN: Well nourished, well developed in  no acute distress; *** NECK: No JVD; No carotid bruits CARDIAC: Normal S1, S2; RRR, no murmurs, rubs, gallops RESPIRATORY:  Clear to auscultation without rales, wheezing or rhonchi ; nonlabored, good air movement. ABDOMEN: Soft, non-tender, non-distended EXTREMITIES:  No edema; No deformity      ASSESSMENT AND PLAN: .    Problem List Items Addressed This Visit       Cardiology Problems   CAD S/P percutaneous coronary angioplasty (Chronic)   Relevant Orders   EKG 12-Lead (Completed)   Coronary artery disease of native heart with stable angina pectoris (Chronic)   Relevant Orders   EKG 12-Lead (Completed)   Paroxysmal atrial fibrillation (HCC) (Chronic)   Relevant Orders   EKG 12-Lead (Completed)   Other Visit Diagnoses       Dyslipidemia, goal LDL below 70    -  Primary   Relevant Orders   EKG 12-Lead (Completed)     Chest pain of uncertain etiology       Relevant Orders   EKG 12-Lead (Completed)       Assessment and Plan Assessment & Plan        {  Are you ordering a CV Procedure (e.g. stress test, cath, DCCV, TEE, etc)?   Press F2        :[id number]}   Follow-Up: No follow-ups on file.  I spent *** minutes in the care of Eric Savage today including {CHL AMB CAR Time Based Billing Options STW (Optional):[phone]::documenting in the encounter.}      Signed, Alm MICAEL Clay, MD, MS Alm Clay, M.D., M.S. Interventional Cardiologist  [facility] Pager # [phone]

## 2024-08-16 ENCOUNTER — Encounter: Payer: Self-pay | Admitting: Cardiology

## 2024-08-16 DIAGNOSIS — I209 Angina pectoris, unspecified: Secondary | ICD-10-CM | POA: Insufficient documentation

## 2024-08-16 NOTE — Assessment & Plan Note (Signed)
 Lipids were well-controlled when he was on his rosuvastatin  but now having held it with a statin holiday, repeat labs showed significant worsening of lipids.  He did not have any improvement of his memory issues, therefore not likely be related to statin. - Restart rosuvastatin  40 mg daily.  - Recheck labs in roughly 3 months.

## 2024-08-16 NOTE — Assessment & Plan Note (Signed)
 RCA stent occluded but the LCx stent is still patent.  Moderate disease in the LAD that was RFR negative. There were a significant mount of left-to-right collaterals to the RCA and I will ask Dr. Jordan to review images to consider the possibility of CTO PCI, since he is now having more anginal symptoms.  - No longer on aspirin  or Plavix  as he is on Xarelto  for A-fib.

## 2024-08-16 NOTE — Assessment & Plan Note (Signed)
 Intermittent angina during routine activities-angina Class II- progressing more toward Class III.SABRA  Previous catheterization showed 100% RCA and 85% artery blockage. Consider CTO PCI if symptoms worsen. Discussed stenting risks including bleeding due to dual antiplatelet therapy. - Consult Dr. Jordan for potential CTO PCI for RCA. - Continue current antianginal regimen with of Ranexa  1000 g twice daily, Imdur  60 mg daily and Toprol  XL 25 mg daily.  - He has as needed NTG, which he uses sparingly  - If nitroglycerin  needed, double isosorbide  mononitrate for two days. -His usual PRN management is taking his existing 25 mg metoprolol  tartrate tablets x 2 along with his alprazolam  and that usually helps his angina to go away.  - Will provide PRN prescription of metoprolol  tartrate 50 mg for either breakthrough A-fib or breakthrough spells of angina. -Add valsartan 40 mL daily for additional afterload reduction since he is having some symptoms to suggest diastolic dysfunction with mediated dyspnea on exertion  - Continue standing dose of furosemide  with as needed dosing for weight gain or worsening dyspnea.

## 2024-08-16 NOTE — Assessment & Plan Note (Signed)
 Interestingly we have had to go up and down on meds in the past.  His blood pressure has gotten higher of late and he is having symptoms of some diastolic dysfunction/diastolic heart failure. Currently only on Toprol  XL 25 mg along with spironolactone  25 mg recently added. - Continue current meds and add valsartan 40 mg daily - Encouraged use of furosemide  daily if necessary and for worsening edema.

## 2024-08-16 NOTE — Assessment & Plan Note (Signed)
 Anginal symptoms seem to be getting worse, more prominent.  Starting to limit his ability to do his job. He is already on Ranexa  1000 mg twice daily, Imdur  60 mg and Toprol  25 mg.  I am adding afterload reduction with valsartan 40 midodrine daily in addition to spironolactone .  Will asked Dr. Jordan to review cath films and extend the possibility of attempted CTO PCI.

## 2024-08-16 NOTE — Assessment & Plan Note (Signed)
 He does not have much edema.  We restarted spironolactone  last visit and gave PRN Lasix .  I recommend that he take it for couple days and then go back to using PRN based on weight changes of 3 pounds.

## 2024-08-16 NOTE — Assessment & Plan Note (Signed)
 Increased frequency of AFib episodes, associated with anxiety. Considering sleep apnea as a factor. Valsartan added for BP and AFib management.  - Continue metoprolol  succinate 25 mg daily.  - Prescribed metoprolol  tartrate 50 mg for breakthrough AFib, 20 tablets with 3 refills. - Started valsartan 40 mg for BP control and AFib management. - Consider sleep study if AFib persists.

## 2024-08-16 NOTE — Assessment & Plan Note (Signed)
 Unfortunately he has gained some weight back.  So it may be volume and send may just be due to dietary indiscretion and less activity.  He is no longer as active with work.  The patient understands the need to lose weight with diet and exercise. We have discussed specific strategies for this.

## 2024-08-16 NOTE — Assessment & Plan Note (Signed)
 Smoking cessation instruction/counseling given:  counseled patient on the dangers of tobacco use, advised patient to stop smoking, and reviewed strategies to maximize success

## 2024-08-18 ENCOUNTER — Telehealth: Payer: Self-pay

## 2024-08-18 NOTE — Telephone Encounter (Signed)
 Spoke to patient appointment scheduled with Dr.Jordan 11/21 at 10:00 am to discuss CTO.

## 2024-08-19 NOTE — Progress Notes (Unsigned)
 Patient ID: Eric Savage                 DOB: [birth year]                    MRN: [id number]      HPI: Eric Savage is a 58 y.o. male patient referred to lipid clinic by  Dr. Anner. PMH is significant for CAD s/p PCI to RCA and Lcx (2010), tobacco use, HLD, paroxysmal Afib, HTN, chronic alcohol use.  Patient was last seen in office by Dr. Anner for routine follow-up in 08/14/2024. Previously, patient was having a lot of memory issues, which resulted in a statin holiday since his lipids were well-controlled on rosuvastatin  40 mg daily. LDL 53 on 01/28/24 (on rosuvastatin ) and last LDL was 138 on 08/12/2024 (not on rosuvastatin ). Lp(a) was also elevated at 165.2 on 02/03/24. During this visit, rosuvastatin  40 mg was restarted. Patient was referred to PharmD lipid clinic.  Patient presented to clinic today and is doing well. Patient is currently on rosuvastatin  40 mg once daily. He stated he stopped taking rosuvastatin  prior to his last visit with Dr. Anner due to memory loss, which did not get better once he stopped the rosuvastatin . Patient stated that he thinks his memory got worse once he stopped the rosuvastatin . Patient stated he is tolerating the rosuvastatin  well. Discussed mechanisms of action, dosing, side effects and potential decreases in LDL cholesterol.  Also recommended smoking cessation, which the patient stated that he was willing to quit. Prescription for Chantix and nicotine  gum was sent to the pharmacy.  Current Medications: rosuvastatin  40 mg once daily Intolerances: in the past has taken atorvastatin  40mg  (muscle pain and memory loss), rosuvastatin  20 mg, simvastatin  20 mg,  Risk Factors: smoking, age, HTN LDL-C goal: <55  Diet: Country cooking Mashed potatoes, vegetables, sweet potatoes, grilled pork chops Exercise:  Working at home, works with for [employer]. Stated he would start walking around [place] Family History:   Social History:   Tobacco: Current smoker (10-21 cigarettes/day) for 30 years  - Patient willing to quit during visit with PharmD today. Sent Rx for Chantix and nicotine  gum  Labs: Lp(a) 165.2 (02/03/24) Lipid Panel     Component Value Date/Time   CHOL 218 (H) 08/12/2024 0841   TRIG 230 (H) 08/12/2024 0841   HDL 39 (L) 08/12/2024 0841   CHOLHDL 5.6 (H) 08/12/2024 0841   CHOLHDL 5.2 12/18/2016 1034   VLDL 19 12/18/2016 1034   LDLCALC 138 (H) 08/12/2024 0841   LABVLDL 41 (H) 08/12/2024 0841    Past Medical History:  Diagnosis Date   Acid reflux    Anxiety    Arthritis    CAD S/P percutaneous coronary angioplasty 09/2009   2010: RCA and LCx PCI (3.0 x 33 Cypher in RCA, 275 x 33 Taxus-LCx), patent by cath in 2014.  => RCA stent under percent occluded as of April 2028.  Left left collaterals.  Smooth 56% proximal LAD RFR negative, focal 60% LCx after stent RFR negative.   Chest pain 01/2013   Evaluate cardiac catheterization, no obstructive CAD   Diverticulitis    Dysrhythmia    OCC PALPITATION    High cholesterol    History of kidney stones    Hypertension     Current Outpatient Medications on File Prior to Visit  Medication Sig Dispense Refill   acetaminophen  (TYLENOL ) 500 MG tablet Take 1,000 mg by mouth as needed  for moderate pain (pain score 4-6).     ALPRAZolam  (XANAX ) 0.5 MG tablet Take 0.5-1 mg by mouth as needed for anxiety.     aspirin  EC 81 MG tablet Take 81 mg by mouth at bedtime.     furosemide  (LASIX ) 20 MG tablet Take 40 mg for 4 days then reduce to 20 mg daily.  Take additional dose of 20 mg as needed for weight gain more than 3 pounds or worsening swelling. 40 tablet 12   isosorbide  mononitrate (IMDUR ) 60 MG 24 hr tablet Take 1 tablet (60 mg total) by mouth daily. May take an extra 120 mg,if you use sublingual NTG as needed 110 tablet 3   metoprolol  succinate (TOPROL -XL) 25 MG 24 hr tablet Take 1 tablet (25 mg total) by mouth daily. 90 tablet 3   metoprolol  tartrate (LOPRESSOR ) 50  MG tablet Take 1 tablet (50 mg total) by mouth as needed. May use as needed for breakthrough afib. 20 tablet 3   nitroGLYCERIN  (NITROSTAT ) 0.4 MG SL tablet Place 1 tablet (0.4 mg total) under the tongue every 5 (five) minutes as needed for chest pain. 90 tablet 3   omeprazole (PRILOSEC) 40 MG capsule Take 40 mg by mouth at bedtime.      QUEtiapine  (SEROQUEL ) 50 MG tablet Take 50 mg by mouth at bedtime.     ranolazine  (RANEXA ) 1000 MG SR tablet Take 1 tablet (1,000 mg total) by mouth 2 (two) times daily. 180 tablet 3   rivaroxaban  (XARELTO ) 20 MG TABS tablet Take 1 tablet (20 mg total) by mouth daily with supper. 90 tablet 2   rosuvastatin  (CRESTOR ) 40 MG tablet Take 1 tablet (40 mg total) by mouth daily. 90 tablet 3   spironolactone  (ALDACTONE ) 25 MG tablet Take 1 tablet (25 mg total) by mouth daily. 90 tablet 3   valsartan (DIOVAN) 40 MG tablet Take 1 tablet (40 mg total) by mouth daily. 90 tablet 3   No current facility-administered medications on file prior to visit.    Allergies  Allergen Reactions   Atorvastatin  Other (See Comments)    Legs ache; memory loss   Nsaids Other (See Comments)    Causes irregular heart beat    Assessment/Plan:  1. Hyperlipidemia -  Hyperlipidemia with target low density lipoprotein (LDL) cholesterol less than 55 mg/dL Assessment Patient's LDL on rosuvastatin  40 mg was below goal LDL <55 at 53 (4/33/25) LDL off of the rosuvastatin  on 08/12/24 was above goal at 138 Patient is tolerating rosuvastatin  well; Patient was taken off the rosuvastatin  due to memory loss but patient stated memory got worse once rosuvastatin  was stopped Patient is a current smoker of 10-12 cigarettes/day, but is willing to try Chantix + nicotine  gum Patient states his wife cooks country foods, but he is willing to incorporate more vegetables and lean proteins Patient walks at work, but has a pond that home that he can start walking around several days a week Plan: Continue  rosuvastatin  40 mg once daily Start Chantix 2 weeks prior to quitting smoking Start nicotine  gum up to 24 pieces/day on quit date Limit fried/greasy foods and increase exercise to 150 minutes/week as tolerated Repeat lipid panel in 3 months   Thank you,  B. Amon Rocher, PharmD PGY-1 Pharmacy Resident [facility] 08/20/2024 4:18 PM   Melissa D Geraldine, Pharm.JONETTA SARAN, CPP [facility] Division of [facility] [address] [place], [place] [address]  Phone: [phone]; Fax: [phone]

## 2024-08-20 ENCOUNTER — Ambulatory Visit: Attending: Internal Medicine | Admitting: Pharmacist

## 2024-08-20 DIAGNOSIS — Z716 Tobacco abuse counseling: Secondary | ICD-10-CM | POA: Diagnosis not present

## 2024-08-20 DIAGNOSIS — E785 Hyperlipidemia, unspecified: Secondary | ICD-10-CM | POA: Diagnosis not present

## 2024-08-20 MED ORDER — VARENICLINE TARTRATE (STARTER) 0.5 MG X 11 & 1 MG X 42 PO TBPK: ORAL_TABLET | ORAL | 0 refills | Status: DC

## 2024-08-20 MED ORDER — NICOTINE POLACRILEX 2 MG MT GUM: 2.0000 mg | CHEWING_GUM | OROMUCOSAL | 1 refills | Status: AC | PRN

## 2024-08-20 MED ORDER — VARENICLINE TARTRATE 1 MG PO TABS: 1.0000 mg | ORAL_TABLET | Freq: Two times a day (BID) | ORAL | 1 refills | Status: DC

## 2024-08-20 NOTE — Patient Instructions (Addendum)
 Start Chantix at least 2 weeks prior to quit date You may chew up to 24 pieces of gum per day. Chew the gum until it tingles and then park it in your cheek until it stops tingling then chew again and repeat Continue rosuvastatin  40mg  daily Decrease your intake of fried foods and fatty cuts of meat Increase your intake of fruits and vegetables

## 2024-08-20 NOTE — Assessment & Plan Note (Signed)
 Assessment Patient's LDL on rosuvastatin  40 mg was below goal LDL <55 at 53 (4/33/25) LDL off of the rosuvastatin  on 08/12/24 was above goal at 138 Patient is tolerating rosuvastatin  well; Patient was taken off the rosuvastatin  due to memory loss but patient stated memory got worse once rosuvastatin  was stopped Patient is a current smoker of 10-12 cigarettes/day, but is willing to try Chantix + nicotine  gum Patient states his wife cooks country foods, but he is willing to incorporate more vegetables and lean proteins Patient walks at work, but has a pond that home that he can start walking around several days a week Plan: Continue rosuvastatin  40 mg once daily Start Chantix 2 weeks prior to quitting smoking Start nicotine  gum up to 24 pieces/day on quit date Limit fried/greasy foods and increase exercise to 150 minutes/week as tolerated Repeat lipid panel in 3 months

## 2024-08-21 NOTE — Assessment & Plan Note (Signed)
 Assessment Patient current smokes 10-12 cigarettes/day Patient initially expressed not wanting to quit smoking due to it being habit for many years, but as we talked more he stated he was interested in using medications to help him quit smoking Plan Start Chantix 2 weeks prior to quitting smoking Start nicotine  gum up to 24 pieces/day on quit date

## 2024-08-24 NOTE — Progress Notes (Signed)
 Cardiology Office Note   Date:  08/24/2024  ID:  Eric Savage, DOB 12-Nov-1965, MRN [id number] PCP: Cristopher Suzen HERO, NP  [facility] Providers Cardiologist:  Alm Clay, MD     History of Present Illness Eric Savage is a 58 y.o. male seen at the request of Dr Clay to consider CTO PCI. He has a history of HTN, HLD, tobacco abuse and obesity. He is s/p stenting of the RCA (3.0 x 33 mm Cypher stent post dilated to 3.7) and LCx in 2010 ( 2.75 x 15 mm Xience). Subsequent Myoview  in 2012 was normal. Heart caths in 2013 and 2014 were OK. In 2020 he was diagnosed with paroxysmal Afib with RVR. He was  seen on 01/28/2024 by Dr. Clay and endorsed chest pain and shortness of breath over the past 6 to 8 months.  He had LHC ordered for further evaluation and to provide clearance for dental procedure.  He underwent left heart cath on 02/03/2024 showing 60% stenosed proximal LAD with RFR showing nonsignificant, 65% distal circumflex lesion also nonsignificant with 80% proximal RCA stenosis and 100% stenosed mid RCA with attempt of balloon angioplasty but aborted due to unable to cross lesion.  He had antianginal was titrated but he has continued to have angina. TTE was completed on 02/04/2024 with EF of 60-65% and grade 1 DD and no significant valve abnormalities.  He tells me that he continues to have chest pain with exertion such as taking out the trash. Relieved with rest and sl Ntg but sometimes takes more than a few minutes. He is on nitrates, beta blocker and Ranexa .   ROS: as noted in HPI. No bleeding issues.  Studies Reviewed      Echo 02/04/24: IMPRESSIONS     1. Left ventricular ejection fraction, by estimation, is 60 to 65%. The  left ventricle has normal function. The left ventricle has no regional  wall motion abnormalities. Left ventricular diastolic parameters are  consistent with Grade I diastolic  dysfunction (impaired relaxation).   2. Right ventricular systolic  function is normal. The right ventricular  size is normal.   3. Left atrial size was mildly dilated.   4. The mitral valve is normal in structure. Trivial mitral valve  regurgitation. No evidence of mitral stenosis.   5. The aortic valve is tricuspid. Aortic valve regurgitation is not  visualized. No aortic stenosis is present.   6. The inferior vena cava is normal in size with greater than 50%  respiratory variability, suggesting right atrial pressure of 3 mmHg.   Comparison(s): No significant change from prior study. Prior images  reviewed side by side. Mitral insufficiency is less prominent on the  current study.   Cardiac cath 02/03/24:  LEFT HEART CATH AND CORONARY ANGIOGRAPHY  CORONARY PRESSURE/FFR STUDY   Conclusion      Prox LAD lesion is 60% stenosed.  (RFR 0.93-NOT a Significant)   Previously placed Prox Cx to Dist Cx stent of unknown type is  widely patent.   -------------------------   Dist Cx lesion is 65% stenosed.  (RFR 0.98-NOT SIGNIFICANT)   -------------------------   Prox RCA lesion is 80% stenosed.   Previously placed Prox RCA to Dist RCA lesion is 100% stenosed-mid stent.  Balloon angioplasty was attempted-aborted; unable to cross lesion with wire; L-R collaterals fill rPDA   Unsuccessful Balloon angioplasty was performed.   -------------------------   The left ventricular systolic function is normal.  LVEF 50 and 55%.  Moderately elevated LVEDP  There is no aortic valve stenosis.   Dominance: Right  POST-OPERATIVE DIAGNOSIS:  3 Vessel CAD: Proximal RCA 80% proximal edge of previous stent with 100% CTO in the midportion of the stent.  The PDA and PLV with PL were perfused via left-to-right collaterals from the LCx and septal perforators Unable to cross lesion with 2 separate wires Focal eccentric 60 % smooth lesion in the proximal LAD with TIMI-3 flow distally-RFR 0.94 (not significant) Widely patent proximal LCx stent with 65 % stenosis just distal to the  stent prior to bifurcation into OM2 (terminal LCx) and OM 3-RFR 0.98 Preserved LVEF, but unable to fully visualize for wall motion abnormality.  Echo pending LVEDP 22 mmHg     RECOMMENDATIONS   In the absence of any other complications or medical issues, we expect the patient to be ready for discharge from a cath perspective on 02/04/2024.   Titrate antianginals: Replace ACE inhibitor with amlodipine  5 mg daily, and increase Imdur  to 30 mg daily.  Low threshold to initiate Ranexa . Reduce/discontinue beta-blocker in setting of bradycardia   Recommend to resume Rivaroxaban , at currently prescribed dose and frequency.   Recommend concurrent antiplatelet therapy of Aspirin  81 mg daily for 6 months.    Risk Assessment/Calculations  CHA2DS2-VASc Score = 2   This indicates a 2.2% annual risk of stroke. The patient's score is based upon: CHF History: 0 HTN History: 1 Diabetes History: 0 Stroke History: 0 Vascular Disease History: 1 Age Score: 0 Gender Score: 0     Physical Exam VS:  There were no vitals taken for this visit.       Wt Readings from Last 3 Encounters:  08/14/24 194 lb (88 kg)  05/15/24 184 lb 9.6 oz (83.7 kg)  02/19/24 192 lb 9.6 oz (87.4 kg)    GEN: Well nourished, well developed in no acute distress NECK: No JVD; No carotid bruits CARDIAC: RRR, no murmurs, rubs, gallops RESPIRATORY:  Clear to auscultation without rales, wheezing or rhonchi  ABDOMEN: Soft, non-tender, non-distended EXTREMITIES:  No edema; No deformity   ASSESSMENT AND PLAN CAD with remote stent of LCx and RCA in 2010 with DES. 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 Cardiac cath in April showed CTO of the mid RCA within the stent. LCA without obstructive disease. It appears reasonable to attempt CTO PCI. Will update labs. Plan procedure on Dec 3. Will initiate Plavix  prior to procedure. Stop omeprazole and switch to Protonix . Hold Xarelto  2 days prior. Will need triple  therapy with ASA, Plavix  and Xarelto  post procedure. Plan to stop ASA at one month and continue plavix  for 6 months. Extensive discussion with patient and mother today. Included description of procedure with need for dual arterial access. The procedure and risks were reviewed including but not limited to death, myocardial infarction, stroke, arrythmias, bleeding, perforation, transfusion, emergency surgery, dye allergy, or renal dysfunction. The patient voices understanding and is agreeable to proceed.     Informed Consent   Shared Decision Making/Informed Consent The risks [stroke (1 in 1000), death (1 in 1000), kidney failure [usually temporary] (1 in 500), bleeding (1 in 200), allergic reaction [possibly serious] (1 in 200)], benefits (diagnostic support and management of coronary artery disease) and alternatives of a cardiac catheterization were discussed in detail with Eric Savage and he is willing to proceed.       Signed, Eric Dobbins, MD

## 2024-08-24 NOTE — H&P (View-Only) (Signed)
 Cardiology Office Note   Date:  08/24/2024  ID:  Eric Savage, DOB 12-Nov-1965, MRN [id number] PCP: Cristopher Suzen HERO, NP  [facility] Providers Cardiologist:  Eric Clay, MD     History of Present Illness Eric Savage is a 58 y.o. male seen at the request of Dr Savage to consider CTO PCI. He has a history of HTN, HLD, tobacco abuse and obesity. He is s/p stenting of the RCA (3.0 x 33 mm Cypher stent post dilated to 3.7) and LCx in 2010 ( 2.75 x 15 mm Xience). Subsequent Myoview  in 2012 was normal. Heart caths in 2013 and 2014 were OK. In 2020 he was diagnosed with paroxysmal Afib with RVR. He was  seen on 01/28/2024 by Dr. Clay and endorsed chest pain and shortness of breath over the past 6 to 8 months.  He had LHC ordered for further evaluation and to provide clearance for dental procedure.  He underwent left heart cath on 02/03/2024 showing 60% stenosed proximal LAD with RFR showing nonsignificant, 65% distal circumflex lesion also nonsignificant with 80% proximal RCA stenosis and 100% stenosed mid RCA with attempt of balloon angioplasty but aborted due to unable to cross lesion.  He had antianginal was titrated but he has continued to have angina. TTE was completed on 02/04/2024 with EF of 60-65% and grade 1 DD and no significant valve abnormalities.  He tells me that he continues to have chest pain with exertion such as taking out the trash. Relieved with rest and sl Ntg but sometimes takes more than a few minutes. He is on nitrates, beta blocker and Ranexa .   ROS: as noted in HPI. No bleeding issues.  Studies Reviewed      Echo 02/04/24: IMPRESSIONS     1. Left ventricular ejection fraction, by estimation, is 60 to 65%. The  left ventricle has normal function. The left ventricle has no regional  wall motion abnormalities. Left ventricular diastolic parameters are  consistent with Grade I diastolic  dysfunction (impaired relaxation).   2. Right ventricular systolic  function is normal. The right ventricular  size is normal.   3. Left atrial size was mildly dilated.   4. The mitral valve is normal in structure. Trivial mitral valve  regurgitation. No evidence of mitral stenosis.   5. The aortic valve is tricuspid. Aortic valve regurgitation is not  visualized. No aortic stenosis is present.   6. The inferior vena cava is normal in size with greater than 50%  respiratory variability, suggesting right atrial pressure of 3 mmHg.   Comparison(s): No significant change from prior study. Prior images  reviewed side by side. Mitral insufficiency is less prominent on the  current study.   Cardiac cath 02/03/24:  LEFT HEART CATH AND CORONARY ANGIOGRAPHY  CORONARY PRESSURE/FFR STUDY   Conclusion      Prox LAD lesion is 60% stenosed.  (RFR 0.93-NOT a Significant)   Previously placed Prox Cx to Dist Cx stent of unknown type is  widely patent.   -------------------------   Dist Cx lesion is 65% stenosed.  (RFR 0.98-NOT SIGNIFICANT)   -------------------------   Prox RCA lesion is 80% stenosed.   Previously placed Prox RCA to Dist RCA lesion is 100% stenosed-mid stent.  Balloon angioplasty was attempted-aborted; unable to cross lesion with wire; L-R collaterals fill rPDA   Unsuccessful Balloon angioplasty was performed.   -------------------------   The left ventricular systolic function is normal.  LVEF 50 and 55%.  Moderately elevated LVEDP  There is no aortic valve stenosis.   Dominance: Right  POST-OPERATIVE DIAGNOSIS:  3 Vessel CAD: Proximal RCA 80% proximal edge of previous stent with 100% CTO in the midportion of the stent.  The PDA and PLV with PL were perfused via left-to-right collaterals from the LCx and septal perforators Unable to cross lesion with 2 separate wires Focal eccentric 60 % smooth lesion in the proximal LAD with TIMI-3 flow distally-RFR 0.94 (not significant) Widely patent proximal LCx stent with 65 % stenosis just distal to the  stent prior to bifurcation into OM2 (terminal LCx) and OM 3-RFR 0.98 Preserved LVEF, but unable to fully visualize for wall motion abnormality.  Echo pending LVEDP 22 mmHg     RECOMMENDATIONS   In the absence of any other complications or medical issues, we expect the patient to be ready for discharge from a cath perspective on 02/04/2024.   Titrate antianginals: Replace ACE inhibitor with amlodipine  5 mg daily, and increase Imdur  to 30 mg daily.  Low threshold to initiate Ranexa . Reduce/discontinue beta-blocker in setting of bradycardia   Recommend to resume Rivaroxaban , at currently prescribed dose and frequency.   Recommend concurrent antiplatelet therapy of Aspirin  81 mg daily for 6 months.    Risk Assessment/Calculations  CHA2DS2-VASc Score = 2   This indicates a 2.2% annual risk of stroke. The patient's score is based upon: CHF History: 0 HTN History: 1 Diabetes History: 0 Stroke History: 0 Vascular Disease History: 1 Age Score: 0 Gender Score: 0     Physical Exam VS:  There were no vitals taken for this visit.       Wt Readings from Last 3 Encounters:  08/14/24 194 lb (88 kg)  05/15/24 184 lb 9.6 oz (83.7 kg)  02/19/24 192 lb 9.6 oz (87.4 kg)    GEN: Well nourished, well developed in no acute distress NECK: No JVD; No carotid bruits CARDIAC: RRR, no murmurs, rubs, gallops RESPIRATORY:  Clear to auscultation without rales, wheezing or rhonchi  ABDOMEN: Soft, non-tender, non-distended EXTREMITIES:  No edema; No deformity   ASSESSMENT AND PLAN CAD with remote stent of LCx and RCA in 2010 with DES. 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 Cardiac cath in April showed CTO of the mid RCA within the stent. LCA without obstructive disease. It appears reasonable to attempt CTO PCI. Will update labs. Plan procedure on Dec 3. Will initiate Plavix  prior to procedure. Stop omeprazole and switch to Protonix . Hold Xarelto  2 days prior. Will need triple  therapy with ASA, Plavix  and Xarelto  post procedure. Plan to stop ASA at one month and continue plavix  for 6 months. Extensive discussion with patient and mother today. Included description of procedure with need for dual arterial access. The procedure and risks were reviewed including but not limited to death, myocardial infarction, stroke, arrythmias, bleeding, perforation, transfusion, emergency surgery, dye allergy, or renal dysfunction. The patient voices understanding and is agreeable to proceed.     Informed Consent   Shared Decision Making/Informed Consent The risks [stroke (1 in 1000), death (1 in 1000), kidney failure [usually temporary] (1 in 500), bleeding (1 in 200), allergic reaction [possibly serious] (1 in 200)], benefits (diagnostic support and management of coronary artery disease) and alternatives of a cardiac catheterization were discussed in detail with Mr. Wenrick and he is willing to proceed.       Signed, Cherene Dobbins, MD

## 2024-08-28 ENCOUNTER — Ambulatory Visit: Attending: Cardiology | Admitting: Cardiology

## 2024-08-28 ENCOUNTER — Other Ambulatory Visit: Payer: Self-pay | Admitting: Cardiology

## 2024-08-28 VITALS — BP 130/76 | HR 55 | Ht 65.0 in | Wt 196.0 lb

## 2024-08-28 DIAGNOSIS — E785 Hyperlipidemia, unspecified: Secondary | ICD-10-CM

## 2024-08-28 DIAGNOSIS — I480 Paroxysmal atrial fibrillation: Secondary | ICD-10-CM

## 2024-08-28 DIAGNOSIS — I25118 Atherosclerotic heart disease of native coronary artery with other forms of angina pectoris: Secondary | ICD-10-CM

## 2024-08-28 DIAGNOSIS — I10 Essential (primary) hypertension: Secondary | ICD-10-CM

## 2024-08-28 DIAGNOSIS — I209 Angina pectoris, unspecified: Secondary | ICD-10-CM

## 2024-08-28 MED ORDER — CLOPIDOGREL BISULFATE 75 MG PO TABS
75.0000 mg | ORAL_TABLET | Freq: Every day | ORAL | 3 refills | Status: AC
  Filled 2024-09-28: qty 30, 30d supply, fill #0
  Filled 2024-10-27: qty 30, 30d supply, fill #1

## 2024-08-28 MED ORDER — PANTOPRAZOLE SODIUM 40 MG PO TBEC: 40.0000 mg | DELAYED_RELEASE_TABLET | Freq: Every day | ORAL | 11 refills | Status: DC

## 2024-08-28 NOTE — Patient Instructions (Addendum)
 Medication Instructions:  Start Plavix  75 mg daily  1 week before Cath Stop Omeprazole  Start Protonix  40 mg daily Continue all other medications *If you need a refill on your cardiac medications before your next appointment, please call your pharmacy*  Lab Work: Bmet,cbc to be done Wed 11/26  Testing/Procedures: Cardiac Cath CTO scheduled at [facility] hospital Wed 12/3 Arrive at 6:30 am   Follow instructions below   Follow-Up: At [facility], you and your health needs are our priority.  As part of our continuing mission to provide you with exceptional heart care, our providers are all part of one team.  This team includes your primary Cardiologist (physician) and Advanced Practice Providers or APPs (Physician Assistants and Nurse Practitioners) who all work together to provide you with the care you need, when you need it.  Your next appointment:  After Procedure    Wed 12/17 at 2:20 pm    Provider:  Dr.Jordan    [facility] A DEPT OF [facility] AT [address] A DEPT OF THE [place] [address] [place] [address] Dept: [phone] Loc: [phone]  AMY BELLOSO  08/28/2024  You are scheduled for a Cardiac Catheterization on Wednesday, December 3 with Dr. Peter Jordan.  1. Please arrive at the [facility] (Main Entrance A) at [facility]: [address] [place], [place] [address] at 6:30 am (This time is 2 hour(s) before your procedure to ensure your preparation).   Free valet parking service is available. You will check in at ADMITTING. The support person will be asked to wait in the waiting room.  It is OK to have someone drop you off and come back when you are ready to be discharged.    Special note: Every effort is made to have your procedure done on time. Please understand that emergencies sometimes delay scheduled procedures.  2. Diet: Nothing to eat after midnight.   3. Hydration: You need  to be well hydrated before your procedure. On December 3, you may drink approved liquids (see below) until 2 hours before the procedure, with 16 oz of water  as your last intake.   List of approved liquids water , clear juice, clear tea, black coffee, fruit juices, non-citric and without pulp, carbonated beverages, Gatorade, Kool -Aid, plain Jello-O and plain ice popsicles.  4. Labs: You will need to have blood drawn on ,  Wed 11/26 at Dr.Jordan's office  1st Floor   You do not need to be fasting.  5. Medication instructions:      Hold Xarelto  2 days before   Hold Lasix  morning of     On the morning of your procedure, take your Aspirin  81 mg and Plavix  75 mg any morning medicines NOT listed above.  You may use sips of water .  6. Plan to go home the same day, you will only stay overnight if medically necessary. 7. Bring a current list of your medications and current insurance cards. 8. You MUST have a responsible person to drive you home. 9. Someone MUST be with you the first 24 hours after you arrive home or your discharge will be delayed. 10. Please wear clothes that are easy to get on and off and wear slip-on shoes.  Thank you for allowing us  to care for you!   -- [facility]   We recommend signing up for the patient portal called MyChart.  Sign up information  is provided on this After Visit Summary.  MyChart is used to connect with patients for Virtual Visits (Telemedicine).  Patients are able to view lab/test results, encounter notes, upcoming appointments, etc.  Non-urgent messages can be sent to your provider as well.   To learn more about what you can do with MyChart, go to [email or web address].

## 2024-09-02 LAB — CBC WITH DIFFERENTIAL/PLATELET

## 2024-09-03 ENCOUNTER — Ambulatory Visit: Payer: Self-pay | Admitting: Cardiology

## 2024-09-03 LAB — BASIC METABOLIC PANEL WITH GFR
BUN/Creatinine Ratio: 9 (ref 9–20)
BUN: 12 mg/dL (ref 6–24)
CO2: 27 mmol/L (ref 20–29)
Calcium: 9.3 mg/dL (ref 8.7–10.2)
Chloride: 98 mmol/L (ref 96–106)
Creatinine, Ser: 1.32 mg/dL — ABNORMAL HIGH (ref 0.76–1.27)
Glucose: 107 mg/dL — ABNORMAL HIGH (ref 70–99)
Potassium: 4.2 mmol/L (ref 3.5–5.2)
Sodium: 140 mmol/L (ref 134–144)
eGFR: 63 mL/min/1.73 (ref >59)

## 2024-09-03 LAB — CBC WITH DIFFERENTIAL/PLATELET
Basos: 0 %
EOS (ABSOLUTE): 0 x10E3/uL (ref 0.0–0.2)
Eos: 2 %
Hematocrit: 45.3 % (ref 37.5–51.0)
Hemoglobin: 15.4 g/dL (ref 13.0–17.7)
Immature Granulocytes: 0 %
Immature Granulocytes: 0 x10E3/uL (ref 0.0–0.1)
Lymphs: 22 %
MCH: 32.3 pg (ref 26.6–33.0)
MCHC: 34 g/dL (ref 31.5–35.7)
MCV: 95 fL (ref 79–97)
Monocytes Absolute: 0.2 x10E3/uL (ref 0.0–0.4)
Monocytes Absolute: 0.8 x10E3/uL (ref 0.1–0.9)
Monocytes: 8 %
Neutrophils Absolute: 2.3 x10E3/uL (ref 0.7–3.1)
Neutrophils Absolute: 7 x10E3/uL (ref 1.4–7.0)
Neutrophils: 68 %
Platelets: 279 x10E3/uL (ref 150–450)
RBC: 4.77 x10E6/uL (ref 4.14–5.80)
RDW: 12.9 % (ref 11.6–15.4)
WBC: 10.4 x10E3/uL (ref 3.4–10.8)

## 2024-09-07 ENCOUNTER — Telehealth: Payer: Self-pay | Admitting: *Deleted

## 2024-09-07 NOTE — Telephone Encounter (Signed)
 CTO scheduled at [facility] for: Wednesday September 09, 2024 8:30 AM Arrival time [facility] Main Entrance A at: 6:30 AM  Diet: -Nothing to eat after midnight.  Hydration: -May drink clear liquids until 2 hours before the procedure.  Approved liquids: Water , clear tea, black coffee, fruit juices-non-citric and without pulp,Gatorade, plain Jello/popsicles.   -Please drink 16 oz of water  2 hours before procedure.  Medication instructions: -Hold:  Xarelto -none 09/07/24 until post procedure  Lasix /Spironolactone -PM prior to procedure -Other usual morning medications can be taken including aspirin  81 mg and Plavix  75 mg  Plan to go home the same day, you will only stay overnight if medically necessary.  You must have responsible adult to drive you home.  Someone must be with you the first 24 hours after you arrive home.  Reviewed procedure instructions with patient.

## 2024-09-09 ENCOUNTER — Ambulatory Visit (HOSPITAL_COMMUNITY)
Admission: RE | Admit: 2024-09-09 | Discharge: 2024-09-10 | Disposition: A | Attending: Cardiology | Admitting: Cardiology

## 2024-09-09 ENCOUNTER — Encounter (HOSPITAL_COMMUNITY): Admission: RE | Disposition: A | Payer: Self-pay | Source: Home / Self Care | Attending: Cardiology

## 2024-09-09 ENCOUNTER — Other Ambulatory Visit: Payer: Self-pay

## 2024-09-09 ENCOUNTER — Other Ambulatory Visit: Payer: Self-pay | Admitting: Cardiology

## 2024-09-09 ENCOUNTER — Encounter (HOSPITAL_COMMUNITY): Payer: Self-pay | Admitting: Cardiology

## 2024-09-09 DIAGNOSIS — I2582 Chronic total occlusion of coronary artery: Secondary | ICD-10-CM | POA: Insufficient documentation

## 2024-09-09 DIAGNOSIS — I25118 Atherosclerotic heart disease of native coronary artery with other forms of angina pectoris: Secondary | ICD-10-CM

## 2024-09-09 DIAGNOSIS — I2510 Atherosclerotic heart disease of native coronary artery without angina pectoris: Secondary | ICD-10-CM

## 2024-09-09 DIAGNOSIS — I25119 Atherosclerotic heart disease of native coronary artery with unspecified angina pectoris: Secondary | ICD-10-CM | POA: Diagnosis not present

## 2024-09-09 DIAGNOSIS — I480 Paroxysmal atrial fibrillation: Secondary | ICD-10-CM | POA: Insufficient documentation

## 2024-09-09 DIAGNOSIS — I10 Essential (primary) hypertension: Secondary | ICD-10-CM | POA: Insufficient documentation

## 2024-09-09 DIAGNOSIS — I209 Angina pectoris, unspecified: Secondary | ICD-10-CM | POA: Diagnosis present

## 2024-09-09 DIAGNOSIS — Z7901 Long term (current) use of anticoagulants: Secondary | ICD-10-CM | POA: Diagnosis not present

## 2024-09-09 DIAGNOSIS — Z955 Presence of coronary angioplasty implant and graft: Secondary | ICD-10-CM | POA: Diagnosis not present

## 2024-09-09 HISTORY — PX: CORONARY CTO INTERVENTION: CATH118236

## 2024-09-09 LAB — POCT ACTIVATED CLOTTING TIME
Activated Clotting Time: 281 s
Activated Clotting Time: 317 s
Activated Clotting Time: 260 s
Activated Clotting Time: 271 s
Activated Clotting Time: 399 s

## 2024-09-09 SURGERY — CORONARY CTO INTERVENTION
Anesthesia: LOCAL

## 2024-09-09 MED ORDER — CLOPIDOGREL BISULFATE 75 MG PO TABS
75.0000 mg | ORAL_TABLET | Freq: Every day | ORAL | Status: DC
  Administered 2024-09-10: 75 mg via ORAL
  Filled 2024-09-09: qty 1

## 2024-09-09 MED ORDER — FENTANYL CITRATE (PF) 100 MCG/2ML IJ SOLN
INTRAMUSCULAR | Status: AC
  Filled 2024-09-09: qty 2

## 2024-09-09 MED ORDER — MIDAZOLAM HCL 2 MG/2ML IJ SOLN
INTRAMUSCULAR | Status: AC
  Filled 2024-09-09: qty 2

## 2024-09-09 MED ORDER — IRBESARTAN 75 MG PO TABS
37.5000 mg | ORAL_TABLET | Freq: Every day | ORAL | Status: DC
  Administered 2024-09-10: 37.5 mg via ORAL
  Filled 2024-09-09: qty 1

## 2024-09-09 MED ORDER — ALPRAZOLAM 0.5 MG PO TABS: 0.5000 mg | ORAL_TABLET | Freq: Three times a day (TID) | ORAL | Status: DC | PRN

## 2024-09-09 MED ORDER — ROSUVASTATIN CALCIUM 20 MG PO TABS
40.0000 mg | ORAL_TABLET | Freq: Every day | ORAL | Status: DC
  Administered 2024-09-09 – 2024-09-10 (×2): 40 mg via ORAL
  Filled 2024-09-09 (×2): qty 2

## 2024-09-09 MED ORDER — LIDOCAINE HCL (PF) 1 % IJ SOLN
INTRAMUSCULAR | Status: AC
  Filled 2024-09-09: qty 30

## 2024-09-09 MED ORDER — LIDOCAINE HCL (PF) 1 % IJ SOLN
INTRAMUSCULAR | Status: DC | PRN
  Administered 2024-09-09: 5 mL via INTRADERMAL

## 2024-09-09 MED ORDER — SODIUM CHLORIDE 0.9% FLUSH
3.0000 mL | Freq: Two times a day (BID) | INTRAVENOUS | Status: DC
  Administered 2024-09-09 – 2024-09-10 (×3): 3 mL via INTRAVENOUS

## 2024-09-09 MED ORDER — ACETAMINOPHEN 325 MG PO TABS: 650.0000 mg | ORAL_TABLET | ORAL | Status: DC | PRN

## 2024-09-09 MED ORDER — ASPIRIN 81 MG PO CHEW: 81.0000 mg | CHEWABLE_TABLET | ORAL | Status: DC

## 2024-09-09 MED ORDER — ISOSORBIDE MONONITRATE ER 60 MG PO TB24
60.0000 mg | ORAL_TABLET | Freq: Every day | ORAL | Status: DC
  Administered 2024-09-09 – 2024-09-10 (×2): 60 mg via ORAL
  Filled 2024-09-09 (×2): qty 1

## 2024-09-09 MED ORDER — METOPROLOL SUCCINATE ER 25 MG PO TB24
25.0000 mg | ORAL_TABLET | Freq: Every day | ORAL | Status: DC
  Administered 2024-09-10: 25 mg via ORAL
  Filled 2024-09-09: qty 1

## 2024-09-09 MED ORDER — HEPARIN SODIUM (PORCINE) 1000 UNIT/ML IJ SOLN
INTRAMUSCULAR | Status: DC | PRN
  Administered 2024-09-09: 3000 [IU] via INTRAVENOUS
  Administered 2024-09-09: 8000 [IU] via INTRAVENOUS
  Administered 2024-09-09: 3000 [IU] via INTRAVENOUS
  Administered 2024-09-09: 2000 [IU] via INTRAVENOUS

## 2024-09-09 MED ORDER — SODIUM CHLORIDE 0.9% FLUSH: 3.0000 mL | INTRAVENOUS | Status: DC | PRN

## 2024-09-09 MED ORDER — HEPARIN (PORCINE) IN NACL 1000-0.9 UT/500ML-% IV SOLN
INTRAVENOUS | Status: DC | PRN
  Administered 2024-09-09: 2000 mL
  Administered 2024-09-09: 500 mL
  Administered 2024-09-09: 1000 mL

## 2024-09-09 MED ORDER — QUETIAPINE FUMARATE 25 MG PO TABS
25.0000 mg | ORAL_TABLET | Freq: Every day | ORAL | Status: DC
  Administered 2024-09-09: 25 mg via ORAL
  Filled 2024-09-09: qty 1

## 2024-09-09 MED ORDER — SODIUM CHLORIDE 0.9 % IV SOLN
INTRAVENOUS | Status: AC | PRN
  Administered 2024-09-09: 10 mL/h via INTRAVENOUS

## 2024-09-09 MED ORDER — SODIUM CHLORIDE 0.9% FLUSH: 3.0000 mL | Freq: Two times a day (BID) | INTRAVENOUS | Status: DC

## 2024-09-09 MED ORDER — FREE WATER
500.0000 mL | Freq: Once | Status: AC
  Administered 2024-09-09: 500 mL via ORAL

## 2024-09-09 MED ORDER — ONDANSETRON HCL 4 MG/2ML IJ SOLN: 4.0000 mg | Freq: Four times a day (QID) | INTRAMUSCULAR | Status: DC | PRN

## 2024-09-09 MED ORDER — MORPHINE SULFATE (PF) 2 MG/ML IV SOLN
2.0000 mg | Freq: Once | INTRAVENOUS | Status: AC
  Administered 2024-09-09: 2 mg via INTRAVENOUS
  Filled 2024-09-09: qty 1

## 2024-09-09 MED ORDER — MORPHINE SULFATE (PF) 2 MG/ML IV SOLN
INTRAVENOUS | Status: AC
  Filled 2024-09-09: qty 1

## 2024-09-09 MED ORDER — LABETALOL HCL 5 MG/ML IV SOLN: 10.0000 mg | INTRAVENOUS | Status: AC | PRN

## 2024-09-09 MED ORDER — ASPIRIN 81 MG PO CHEW
81.0000 mg | CHEWABLE_TABLET | ORAL | Status: AC
  Administered 2024-09-09: 81 mg via ORAL
  Filled 2024-09-09: qty 1

## 2024-09-09 MED ORDER — NITROGLYCERIN 1 MG/10 ML FOR IR/CATH LAB
INTRA_ARTERIAL | Status: AC
  Filled 2024-09-09: qty 10

## 2024-09-09 MED ORDER — SODIUM CHLORIDE 0.9 % IV SOLN: 250.0000 mL | INTRAVENOUS | Status: DC | PRN

## 2024-09-09 MED ORDER — HYDRALAZINE HCL 20 MG/ML IJ SOLN: 10.0000 mg | INTRAMUSCULAR | Status: AC | PRN

## 2024-09-09 MED ORDER — HEPARIN SODIUM (PORCINE) 1000 UNIT/ML IJ SOLN
INTRAMUSCULAR | Status: AC
  Filled 2024-09-09: qty 10

## 2024-09-09 MED ORDER — CLOPIDOGREL BISULFATE 75 MG PO TABS
75.0000 mg | ORAL_TABLET | Freq: Once | ORAL | Status: AC
  Administered 2024-09-09: 75 mg via ORAL
  Filled 2024-09-09: qty 1

## 2024-09-09 MED ORDER — FENTANYL CITRATE (PF) 100 MCG/2ML IJ SOLN
INTRAMUSCULAR | Status: DC | PRN
  Administered 2024-09-09 (×3): 25 ug via INTRAVENOUS
  Administered 2024-09-09: 50 ug via INTRAVENOUS
  Administered 2024-09-09 (×4): 25 ug via INTRAVENOUS

## 2024-09-09 MED ORDER — FUROSEMIDE 20 MG PO TABS
20.0000 mg | ORAL_TABLET | Freq: Every day | ORAL | Status: DC
  Filled 2024-09-09 (×2): qty 1

## 2024-09-09 MED ORDER — PANTOPRAZOLE SODIUM 40 MG PO TBEC
40.0000 mg | DELAYED_RELEASE_TABLET | Freq: Every day | ORAL | Status: DC
  Administered 2024-09-09 – 2024-09-10 (×2): 40 mg via ORAL
  Filled 2024-09-09 (×2): qty 1

## 2024-09-09 MED ORDER — ONDANSETRON HCL 4 MG/2ML IJ SOLN
INTRAMUSCULAR | Status: DC | PRN
  Administered 2024-09-09: 4 mg via INTRAVENOUS

## 2024-09-09 MED ORDER — ONDANSETRON HCL 4 MG/2ML IJ SOLN
INTRAMUSCULAR | Status: AC
  Filled 2024-09-09: qty 2

## 2024-09-09 MED ORDER — MIDAZOLAM HCL (PF) 2 MG/2ML IJ SOLN
INTRAMUSCULAR | Status: DC | PRN
  Administered 2024-09-09: 2 mg via INTRAVENOUS
  Administered 2024-09-09 (×7): 1 mg via INTRAVENOUS

## 2024-09-09 MED ORDER — ASPIRIN 81 MG PO TBEC
81.0000 mg | DELAYED_RELEASE_TABLET | Freq: Every day | ORAL | Status: DC
  Administered 2024-09-10: 81 mg via ORAL
  Filled 2024-09-09: qty 1

## 2024-09-09 MED ORDER — NITROGLYCERIN 1 MG/10 ML FOR IR/CATH LAB
INTRA_ARTERIAL | Status: DC | PRN
  Administered 2024-09-09 (×3): 200 ug via INTRACORONARY

## 2024-09-09 MED ORDER — MORPHINE SULFATE (PF) 2 MG/ML IV SOLN
INTRAVENOUS | Status: DC | PRN
  Administered 2024-09-09 (×2): 2 mg via INTRAVENOUS

## 2024-09-09 SURGICAL SUPPLY — 34 items
BALLOON EMERGE MR 2.0X20 (BALLOONS) IMPLANT
BALLOON WOLVERINE 3.00X10 (BALLOONS) IMPLANT
BALLOON ~~LOC~~ EMERGE MR 2.0X12 (BALLOONS) IMPLANT
BALLOON ~~LOC~~ EMERGE MR 3.5X15 (BALLOONS) IMPLANT
BALLOON ~~LOC~~ EUPHORA RX 3.5X8 (BALLOONS) IMPLANT
CATH CROSSBOSS CTO (CATHETERS) IMPLANT
CATH GUIDELINER 8F (CATHETERS) IMPLANT
CATH GUIDELINER COAST 7F (CATHETERS) IMPLANT
CATH INFINITI 5FR JL4 (CATHETERS) IMPLANT
CATH IV OPTICROSS HD 3FRX135 (CATHETERS) IMPLANT
CATH MACH 1 8FR AL1 SH 90CM (CATHETERS) IMPLANT
CATH MACH1 8FR AL.75 90CM (CATHETERS) IMPLANT
CATH MAMBA 135 (CATHETERS) IMPLANT
CLOSURE PERCLOSE PROSTYLE (Vascular Products) IMPLANT
DRAPE IVUS SLED (BAG) IMPLANT
GUIDE CATH MACH 1 7F AL.75 (CATHETERS) IMPLANT
KIT ENCORE 26 ADVANTAGE (KITS) IMPLANT
KIT HEMO VALVE WATCHDOG (MISCELLANEOUS) IMPLANT
KIT MICROPUNCTURE NIT STIFF (SHEATH) IMPLANT
PACK CARDIAC CATHETERIZATION (CUSTOM PROCEDURE TRAY) ×1 IMPLANT
SET ATX-X65L (MISCELLANEOUS) IMPLANT
SHEATH PINNACLE 25CM (SHEATH) IMPLANT
SHEATH PINNACLE 5F 10CM (SHEATH) IMPLANT
SHEATH PROBE COVER 6X72 (BAG) IMPLANT
STENT MEGATRON 3.5X24 (Permanent Stent) IMPLANT
STENT SYNERGY XD 2.25X16 (Permanent Stent) IMPLANT
STENT SYNERGY XD 2.50X48 (Permanent Stent) IMPLANT
STENT SYNERGY XD 2.75X38 (Permanent Stent) IMPLANT
TRANSDUCER W/STOPCOCK (MISCELLANEOUS) IMPLANT
TUBING CIL FLEX 10 FLL-RA (TUBING) IMPLANT
WIRE ASAHI MIRACLEBROS-3 300CM (WIRE) IMPLANT
WIRE ASAHI PROWATER 180CM (WIRE) IMPLANT
WIRE ASAHI PROWATER 300CM (WIRE) IMPLANT
WIRE EMERALD 3MM-J .035X150CM (WIRE) IMPLANT

## 2024-09-09 NOTE — Interval H&P Note (Signed)
 History and Physical Interval Note:  09/09/2024 8:48 AM  Eric Savage  has presented today for surgery, with the diagnosis of CAD.  The various methods of treatment have been discussed with the patient and family. After consideration of risks, benefits and other options for treatment, the patient has consented to  Procedure(s): CORONARY CTO INTERVENTION (N/A) as a surgical intervention.  The patient's history has been reviewed, patient examined, no change in status, stable for surgery.  I have reviewed the patient's chart and labs.  Questions were answered to the patient's satisfaction.   Cath Lab Visit (complete for each Cath Lab visit)  Clinical Evaluation Leading to the Procedure:   ACS: No.  Non-ACS:    Anginal Classification: CCS III  Anti-ischemic medical therapy: Maximal Therapy (2 or more classes of medications)  Non-Invasive Test Results: No non-invasive testing performed  Prior CABG: No previous CABG        [facility] 09/09/2024 8:48 AM

## 2024-09-09 NOTE — Discharge Instructions (Signed)

## 2024-09-10 ENCOUNTER — Encounter (HOSPITAL_COMMUNITY): Payer: Self-pay | Admitting: Cardiology

## 2024-09-10 ENCOUNTER — Other Ambulatory Visit (HOSPITAL_COMMUNITY): Payer: Self-pay

## 2024-09-10 DIAGNOSIS — I2582 Chronic total occlusion of coronary artery: Secondary | ICD-10-CM | POA: Diagnosis not present

## 2024-09-10 DIAGNOSIS — I25119 Atherosclerotic heart disease of native coronary artery with unspecified angina pectoris: Secondary | ICD-10-CM | POA: Diagnosis not present

## 2024-09-10 DIAGNOSIS — I480 Paroxysmal atrial fibrillation: Secondary | ICD-10-CM | POA: Diagnosis not present

## 2024-09-10 DIAGNOSIS — I10 Essential (primary) hypertension: Secondary | ICD-10-CM | POA: Diagnosis not present

## 2024-09-10 LAB — BASIC METABOLIC PANEL WITH GFR
Anion gap: 7 (ref 5–15)
BUN: 12 mg/dL (ref 6–20)
CO2: 30 mmol/L (ref 22–32)
Calcium: 8.6 mg/dL — ABNORMAL LOW (ref 8.9–10.3)
Chloride: 101 mmol/L (ref 98–111)
Creatinine, Ser: 1.25 mg/dL — ABNORMAL HIGH (ref 0.61–1.24)
GFR, Estimated: >60 mL/min (ref >60)
Glucose, Bld: 149 mg/dL — ABNORMAL HIGH (ref 70–99)
Potassium: 3.9 mmol/L (ref 3.5–5.1)
Sodium: 138 mmol/L (ref 135–145)

## 2024-09-10 LAB — CBC
HCT: 37.9 % — ABNORMAL LOW (ref 39.0–52.0)
Hemoglobin: 12.8 g/dL — ABNORMAL LOW (ref 13.0–17.0)
MCH: 31.9 pg (ref 26.0–34.0)
MCHC: 33.8 g/dL (ref 30.0–36.0)
MCV: 94.5 fL (ref 80.0–100.0)
Platelets: 200 K/uL (ref 150–400)
RBC: 4.01 MIL/uL — ABNORMAL LOW (ref 4.22–5.81)
RDW: 12.1 % (ref 11.5–15.5)
WBC: 8.8 K/uL (ref 4.0–10.5)
nRBC: 0 % (ref 0.0–0.2)

## 2024-09-10 MED ORDER — ASPIRIN EC 81 MG PO TBEC
81.0000 mg | DELAYED_RELEASE_TABLET | Freq: Every day | ORAL | 0 refills | Status: AC
  Filled 2024-09-10: qty 30, 30d supply, fill #0

## 2024-09-10 NOTE — Discharge Summary (Addendum)
 Discharge Summary   Patient ID: Eric Savage MRN: [id number]; DOB: September 30, 1966  Admit date: 09/09/2024 Discharge date: 09/10/2024  PCP:  Cristopher Suzen HERO, NP   [facility] Providers Cardiologist:  Alm Clay, MD    Discharge Diagnoses  Principal Problem:   Angina pectoris   Diagnostic Studies/Procedures   Cath: 09/09/2024    Prox RCA lesion is 80% stenosed.   Prox RCA to Dist RCA lesion is 100% stenosed.   RPDA lesion is 95% stenosed.   Non-stenotic Prox Cx to Dist Cx lesion was previously treated.   A drug-eluting stent was successfully placed using a STENT SYNERGY XD [id number].   A drug-eluting stent was successfully placed using a STENT SYNERGY XD [id number].   A drug-eluting stent was successfully placed using a STENT MEGATRON 3.5X24.   A drug-eluting stent was successfully placed using a STENT SYNERGY XD 2.25X16.   Post intervention, there is a 0% residual stenosis.   Post intervention, there is a 0% residual stenosis.   Post intervention, there is a 0% residual stenosis.   Recommend to resume Rivaroxaban , at currently prescribed dose and frequency on 09/10/2024.   Recommend concurrent antiplatelet therapy of Aspirin  81 mg for 1 month and Clopidogrel  75mg  daily for 6 months .   Successful complex PCI of CTO of the RCA with IVUS guidance, multiple stents x 4   Plan: will observe overnight. Plan ASA for one month. May resume Xarelto  tomorrow. Plan Plavix  for a minimum of 6 months but would consider long term if no bleeding given significant number and layers of stent in RCA  Diagnostic Dominance: Right  Intervention   _____________   History of Present Illness   Eric Savage is a 58 y.o. male with a history of HTN, HLD, tobacco abuse and obesity. He is s/p stenting of the RCA (3.0 x 33 mm Cypher stent post dilated to 3.7) and LCx in 2010 ( 2.75 x 15 mm Xience). Subsequent Myoview  in 2012 was normal. Heart caths in 2013 and 2014 were OK. In 2020 he was  diagnosed with paroxysmal Afib with RVR. He was seen on 01/28/2024 by Dr. Clay and endorsed chest pain and shortness of breath over the past 6 to 8 months.  He had LHC ordered for further evaluation and to provide clearance for dental procedure.  He underwent left heart cath on 02/03/2024 showing 60% stenosed proximal LAD with RFR showing nonsignificant, 65% distal circumflex lesion also nonsignificant with 80% proximal RCA stenosis and 100% stenosed mid RCA with attempt of balloon angioplasty but aborted due to unable to cross lesion.  His antianginal therapy was titrated but he had continued to have angina. TTE was completed on 02/04/2024 with EF of 60-65% and grade 1 DD and no significant valve abnormalities. Referred to Dr. Jordan for consideration of CTO PCI. Seen in the office office on 11/21 and felt to be a reasonable candidate to proceed.    Hospital Course    Underwent successful but complex PCI of CTO of RCA with IVUS guidance and DES x 4.  Recommendations for triple therapy for 1 month with aspirin , Plavix  and Xarelto .  Then stop aspirin  with continuation of Plavix  and Xarelto .  Would consider long-term Plavix  if no bleeding given significant number of stents and layering within the RCA.  No complications noted overnight.  Able to ambulate.  General: Well developed, well nourished, male appearing in no acute distress. Head: Normocephalic, atraumatic.  Neck: Supple without bruits, JVD. Lungs:  Resp regular  and unlabored, CTA. Heart: RRR, S1, S2, no S3, S4, or murmur; no rub. Abdomen: Soft, non-tender, non-distended with normoactive bowel sounds.  Extremities: No clubbing, cyanosis, edema. Distal pedal pulses are 2+ bilaterally. L/R femoral cath site stable mild bruising but no hematoma Neuro: Alert and oriented X 3. Moves all extremities spontaneously. Psych: Normal affect.  Patient was seen by myself and Dr. Wendel and deemed stable for discharge home. Follow up arranged in the office.  Follow up arranged in the office.   Did the patient have an acute coronary syndrome (MI, NSTEMI, STEMI, etc) this admission?:  Yes                               AHA/ACC ACS Clinical Performance & Quality Measures: Aspirin  prescribed? - Yes ADP Receptor Inhibitor (Plavix /Clopidogrel , Brilinta/Ticagrelor or Effient/Prasugrel) prescribed (includes medically managed patients)? - Yes Beta Blocker prescribed? - Yes High Intensity Statin (Lipitor 40-80mg  or Crestor  20-40mg ) prescribed? - Yes EF assessed during THIS hospitalization? - Yes For EF <40%, was ACEI/ARB prescribed? - Not Applicable (EF >/= 40%) For EF <40%, Aldosterone Antagonist (Spironolactone  or Eplerenone) prescribed? - Not Applicable (EF >/= 40%) Cardiac Rehab Phase II ordered (including medically managed patients)? - Yes   The patient will be scheduled for a TOC follow up appointment in 10-14 days.  A message has been sent to the [facility] and Scheduling Pool at the office where the patient should be seen for follow up.  _____________  Discharge Vitals Blood pressure 132/74, pulse 60, temperature 98.4 F (36.9 C), resp. rate 16, height 5' 5 (1.651 m), weight 83.9 kg, SpO2 95%.  Filed Weights   09/09/24 0700  Weight: 83.9 kg    Labs & Radiologic Studies  CBC Recent Labs    09/10/24 0457  WBC 8.8  HGB 12.8*  HCT 37.9*  MCV 94.5  PLT 200   Basic Metabolic Panel Recent Labs    87/95/74 0457  NA 138  K 3.9  CL 101  CO2 30  GLUCOSE 149*  BUN 12  CREATININE 1.25*  CALCIUM  8.6*   Liver Function Tests No results for input(s): AST, ALT, ALKPHOS, BILITOT, PROT, ALBUMIN in the last 72 hours. No results for input(s): LIPASE, AMYLASE in the last 72 hours. High Sensitivity Troponin:   No results for input(s): TROPONINIHS in the last 720 hours.  No results for input(s): TRNPT in the last 720 hours.  BNP Invalid input(s): POCBNP No results for input(s): PROBNP in the last 72 hours.  No results  for input(s): BNP in the last 72 hours.  D-Dimer No results for input(s): DDIMER in the last 72 hours. Hemoglobin A1C No results for input(s): HGBA1C in the last 72 hours. Fasting Lipid Panel No results for input(s): CHOL, HDL, LDLCALC, TRIG, CHOLHDL, LDLDIRECT in the last 72 hours. Lipoprotein (a)  Date/Time Value Ref Range Status  02/03/2024 04:00 AM 165.2 (H) <75.0 nmol/L Final    Comment:    (NOTE) Note:  Values greater than or equal to 75.0 nmol/L may       indicate an independent risk factor for CHD,       but must be evaluated with caution when applied       to non-Caucasian populations due to the       influence of genetic factors on Lp(a) across       ethnicities. Performed At: [facility] [address] [place], [place] [id number] Nagendra Sanjai  MD Ey:[phone]     Thyroid  Function Tests No results for input(s): TSH, T4TOTAL, T3FREE, THYROIDAB in the last 72 hours.  Invalid input(s): FREET3 _____________  CARDIAC CATHETERIZATION Result Date: 09/09/2024   Prox RCA lesion is 80% stenosed.   Prox RCA to Dist RCA lesion is 100% stenosed.   RPDA lesion is 95% stenosed.   Non-stenotic Prox Cx to Dist Cx lesion was previously treated.   A drug-eluting stent was successfully placed using a STENT SYNERGY XD [id number].   A drug-eluting stent was successfully placed using a STENT SYNERGY XD [id number].   A drug-eluting stent was successfully placed using a STENT MEGATRON 3.5X24.   A drug-eluting stent was successfully placed using a STENT SYNERGY XD 2.25X16.   Post intervention, there is a 0% residual stenosis.   Post intervention, there is a 0% residual stenosis.   Post intervention, there is a 0% residual stenosis.   Recommend to resume Rivaroxaban , at currently prescribed dose and frequency on 09/10/2024.   Recommend concurrent antiplatelet therapy of Aspirin  81 mg for 1 month and Clopidogrel  75mg  daily for 6 months . Successful complex PCI of CTO of the  RCA with IVUS guidance, multiple stents x 4 Plan: will observe overnight. Plan ASA for one month. May resume Xarelto  tomorrow. Plan Plavix  for a minimum of 6 months but would consider long term if no bleeding given significant number and layers of stent in RCA    Disposition Pt is being discharged home today in good condition.  Follow-up Plans & Appointments  Discharge Instructions     AMB Referral to Cardiac Rehabilitation - Phase II   Complete by: As directed    Diagnosis: Coronary Stents   After initial evaluation and assessments completed: Virtual Based Care may be provided alone or in conjunction with Phase 2 Cardiac Rehab based on patient barriers.: Yes   Intensive Cardiac Rehabilitation (ICR) [facility] location only OR Traditional Cardiac Rehabilitation (TCR) *If criteria for ICR are not met will enroll in TCR [facility] only): Yes   Call MD for:  difficulty breathing, headache or visual disturbances   Complete by: As directed    Call MD for:  persistant dizziness or light-headedness   Complete by: As directed    Call MD for:  redness, tenderness, or signs of infection (pain, swelling, redness, odor or green/yellow discharge around incision site)   Complete by: As directed    Diet - low sodium heart healthy   Complete by: As directed    Discharge instructions   Complete by: As directed    Groin Site Care Refer to this sheet in the next few weeks. These instructions provide you with information on caring for yourself after your procedure. Your caregiver may also give you more specific instructions. Your treatment has been planned according to current medical practices, but problems sometimes occur. Call your caregiver if you have any problems or questions after your procedure. HOME CARE INSTRUCTIONS You may shower 24 hours after the procedure. Remove the bandage (dressing) and gently wash the site with plain soap and water . Gently pat the site dry.  Do not apply powder or lotion to the site.   Do not sit in a bathtub, swimming pool, or whirlpool for 5 to 7 days.  No bending, squatting, or lifting anything over 10 pounds (4.5 kg) as directed by your caregiver.  Inspect the site at least twice daily.  Do not drive home if you are discharged the same day of the procedure. Have someone else  drive you.  You may drive 24 hours after the procedure unless otherwise instructed by your caregiver.  What to expect: Any bruising will usually fade within 1 to 2 weeks.  Blood that collects in the tissue (hematoma) may be painful to the touch. It should usually decrease in size and tenderness within 1 to 2 weeks.  SEEK IMMEDIATE MEDICAL CARE IF: You have unusual pain at the groin site or down the affected leg.  You have redness, warmth, swelling, or pain at the groin site.  You have drainage (other than a small amount of blood on the dressing).  You have chills.  You have a fever or persistent symptoms for more than 72 hours.  You have a fever and your symptoms suddenly get worse.  Your leg becomes pale, cool, tingly, or numb.  You have heavy bleeding from the site. Hold pressure on the site. SABRA  PLEASE DO NOT MISS ANY DOSES OF YOUR PLAVIX !!!!! Also keep a log of you blood pressures and bring back to your follow up appt. Please call the office with any questions.   Patients taking blood thinners should generally stay away from medicines like ibuprofen, Advil, Motrin, naproxen, and Aleve due to risk of stomach bleeding. You may take Tylenol  as directed or talk to your primary doctor about alternatives.  PLEASE ENSURE THAT YOU DO NOT RUN OUT OF YOUR PLAVIX . This medication is very important to remain on for at least 6months. IF you have issues obtaining this medication due to cost please CALL the office 3-5 business days prior to running out in order to prevent missing doses of this medication.   Increase activity slowly   Complete by: As directed        Discharge Medications Allergies as of  09/10/2024       Reactions   Atorvastatin  Other (See Comments)   Legs ache; memory loss   Nsaids Other (See Comments)   Causes irregular heart beat        Medication List     TAKE these medications    acetaminophen  500 MG tablet Commonly known as: TYLENOL  Take 1,000 mg by mouth every 6 (six) hours as needed for moderate pain (pain score 4-6).   ALPRAZolam  0.5 MG tablet Commonly known as: XANAX  Take 0.5 mg by mouth 3 (three) times daily as needed for anxiety.   aspirin  EC 81 MG tablet Take 1 tablet (81 mg total) by mouth at bedtime.   clopidogrel  75 MG tablet Commonly known as: PLAVIX  Take 1 tablet (75 mg total) by mouth daily.   furosemide  20 MG tablet Commonly known as: Lasix  Take 40 mg for 4 days then reduce to 20 mg daily.  Take additional dose of 20 mg as needed for weight gain more than 3 pounds or worsening swelling.   isosorbide  mononitrate 60 MG 24 hr tablet Commonly known as: IMDUR  Take 1 tablet (60 mg total) by mouth daily. May take an extra 120 mg,if you use sublingual NTG as needed   metoprolol  succinate 25 MG 24 hr tablet Commonly known as: TOPROL -XL Take 1 tablet (25 mg total) by mouth daily.   metoprolol  tartrate 50 MG tablet Commonly known as: LOPRESSOR  Take 1 tablet (50 mg total) by mouth as needed. May use as needed for breakthrough afib.   nicotine  polacrilex 2 MG gum Commonly known as: NICORETTE  Take 1 each (2 mg total) by mouth as needed for smoking cessation.   nitroGLYCERIN  0.4 MG SL tablet Commonly known as: NITROSTAT  Place  1 tablet (0.4 mg total) under the tongue every 5 (five) minutes as needed for chest pain.   pantoprazole  40 MG tablet Commonly known as: PROTONIX  Take 1 tablet (40 mg total) by mouth daily.   QUEtiapine  50 MG tablet Commonly known as: SEROQUEL  Take 50 mg by mouth at bedtime as needed (insomnia).   ranolazine  1000 MG SR tablet Commonly known as: RANEXA  Take 1 tablet (1,000 mg total) by mouth 2 (two) times  daily.   rivaroxaban  20 MG Tabs tablet Commonly known as: Xarelto  Take 1 tablet (20 mg total) by mouth daily with supper.   rosuvastatin  40 MG tablet Commonly known as: CRESTOR  Take 1 tablet (40 mg total) by mouth daily.   spironolactone  25 MG tablet Commonly known as: ALDACTONE  Take 1 tablet (25 mg total) by mouth daily.   valsartan  40 MG tablet Commonly known as: Diovan  Take 1 tablet (40 mg total) by mouth daily.   Varenicline  Tartrate (Starter) 0.5 MG X 11 & 1 MG X 42 Tbpk Commonly known as: Chantix  Starting Month Pak Days 1 to 3: 0.5 mg once daily. Days 4 to 7: 0.5 mg twice daily. Dday 8 and later: 1 mg twice daily   varenicline  1 MG tablet Commonly known as: Chantix  Take 1 tablet (1 mg total) by mouth 2 (two) times daily.         Outstanding Labs/Studies  N/a   Duration of Discharge Encounter: APP Time: 20 minutes   Signed, Manuelita Rummer, NP 09/10/2024, 9:16 AM  ATTENDING ATTESTATION:  After conducting a review of all available clinical information with the care team, interviewing the patient, and performing a physical exam, I agree with the findings and plan described in this note.   GEN: No acute distress, AO x 3 HEENT:  MMM, no JVD, no scleral icterus Cardiac: RRR, no murmurs, rubs, or gallops.  Respiratory: Clear to auscultation bilaterally. GI: Soft, nontender, non-distended  MS: No edema; No deformity. Neuro:  Nonfocal  Vasc:  +2 radial pulses  Patient is doing well after CTO PCI of the right coronary artery with 4 drug-eluting stents.  I reviewed the patient's coronary angiography study demonstrating excellent final angiographic result.  Patient's bilateral groin sites are stable.  Will resume Xarelto  today due to a history of paroxysmal atrial fibrillation.  Will plan on triple therapy for 1 month and then Plavix  for 6 months followed by indefinite Xarelto .  Discharge today with close hospital follow-up  APP discharge time:20 MD discharge  time:21  Lurena Red, MD Pager [phone]

## 2024-09-10 NOTE — Progress Notes (Signed)
 CARDIAC REHAB PHASE I     Post stent education including site care, restrictions, risk factors, exercise guidelines, NTG use, antiplatelet therapy importance, heart healthy diet, smoking cessation and CRP2 reviewed. All questions and concerns addressed. Will refer to [facility] for CRP2. Plan for home later today.    [id number] Eric Asberry Hacking, RN BSN 09/10/2024 9:19 AM

## 2024-09-10 NOTE — Plan of Care (Signed)

## 2024-09-14 ENCOUNTER — Telehealth (HOSPITAL_COMMUNITY): Payer: Self-pay

## 2024-09-14 NOTE — Telephone Encounter (Signed)
 Attempted to call patient in regards to Cardiac Rehab - LM on VM

## 2024-09-17 ENCOUNTER — Telehealth (HOSPITAL_COMMUNITY): Payer: Self-pay

## 2024-09-17 NOTE — Telephone Encounter (Signed)
 Patient returned phone call.  He stated that he was not interested in Cardia rehab program.  Closed referral

## 2024-09-18 ENCOUNTER — Telehealth: Payer: Self-pay | Admitting: Cardiology

## 2024-09-18 ENCOUNTER — Telehealth: Payer: Self-pay

## 2024-09-18 MED ORDER — FAMOTIDINE 20 MG PO TABS: 20.0000 mg | ORAL_TABLET | Freq: Every day | ORAL | 6 refills | Status: DC

## 2024-09-18 NOTE — Telephone Encounter (Signed)
 Walk-in request being forwarded to

## 2024-09-18 NOTE — Telephone Encounter (Signed)
 Spoke to patient Dr.Jordan's advice given.Stated he already spoke to a nurse.He was scheduled appointment on Mon 12/15 at 2:00 pm to review all his medications.Stated he understood he can take Pepcid  with Protonix .Advised he should stop Protonix  and start Pepcid  20 mg daily.He will stop Protonix  and take Pepcid  20 mg daily over the weekend. Advised I will send message to Dr.Jordan to clarify.

## 2024-09-18 NOTE — Telephone Encounter (Signed)
 Pt c/o medication issue:  1. Name of Medication:   pantoprazole  (PROTONIX ) 40 MG tablet    2. How are you currently taking this medication (dosage and times per day)?  Take 1 tablet (40 mg total) by mouth daily.       3. Are you having a reaction (difficulty breathing--STAT)? No  4. What is your medication issue? Pt is requesting a callback regarding this medication not working for him. He'd like to speak with a nurse about going back on a medication he was on before. Please advise.

## 2024-09-18 NOTE — Telephone Encounter (Signed)
 Patient walked in, here in [place]. he would like to speak with a nurse about the severe heart burn is he having due to the change of meds to Pantoprazole . He would like to switch back to original medication. Patient is on plavix  which has an interaction with omeprazole so he was switched to pantoprazole  to avoid the interaction. RN consulted w/ Dr. Anner who states he would be fine with switching to Brilinta (180 mg load -then 90 mg BID) & using Omeprazole. Should Brilinta be too expensive, we can use Effient 10 mg - would be 60 mg load. RN discussed this in person w/ patient who states he really just wants to go back to the Xarelto  / Omeprazole combination that he felt worked well. Patient also states he is very confused about what medications he should or should not be taking and would like a comprehensive medication list from us . RN will consult again w/ Dr. Anner about going back to his original medications.

## 2024-09-18 NOTE — Addendum Note (Signed)
 Addended by: CHRISTIANNE CHANNING PARAS on: 09/18/2024 04:56 PM   Modules accepted: Orders

## 2024-09-18 NOTE — Telephone Encounter (Signed)
 Reason for walk-in: Walk-in Reasons: medication refill  If patient is requesting to be seen today, or if patient is having symptoms:  What symptoms are being reported (if any)?  Serve heart burn change and would like to switch from Pantoprazole  back to original meds.  Route to triage pool and ensure Teams message has been sent to the Triage Walk-In chat.  3.   For medication samples, medication refills, HIM requests, appointment requests, lab-related requests, or form/record drop-off, please route to the appropriate pool.

## 2024-09-18 NOTE — Telephone Encounter (Signed)
 Patient went to [employer] and spoke with clinical supervisor, Gore. This was addressed in person, see other phone note from today for details.

## 2024-09-21 ENCOUNTER — Ambulatory Visit

## 2024-09-21 ENCOUNTER — Ambulatory Visit: Admitting: Cardiology

## 2024-09-21 ENCOUNTER — Other Ambulatory Visit (HOSPITAL_COMMUNITY): Payer: Self-pay

## 2024-09-21 MED ORDER — SPIRONOLACTONE 25 MG PO TABS
25.0000 mg | ORAL_TABLET | Freq: Every day | ORAL | 3 refills | Status: AC
  Filled 2024-09-28: qty 90, 90d supply, fill #0
  Filled 2024-09-30: qty 30, 30d supply, fill #0
  Filled 2024-10-27: qty 30, 30d supply, fill #1

## 2024-09-21 MED ORDER — ISOSORBIDE MONONITRATE ER 60 MG PO TB24: 60.0000 mg | ORAL_TABLET | Freq: Every day | ORAL | 11 refills | Status: DC

## 2024-09-21 MED ORDER — QUETIAPINE FUMARATE 50 MG PO TABS
50.0000 mg | ORAL_TABLET | Freq: Every day | ORAL | 5 refills | Status: AC
  Filled 2024-09-28: qty 30, 30d supply, fill #0
  Filled 2024-10-27: qty 30, 30d supply, fill #1

## 2024-09-21 MED ORDER — ALPRAZOLAM 0.5 MG PO TABS
0.5000 mg | ORAL_TABLET | Freq: Three times a day (TID) | ORAL | 5 refills | Status: AC
  Filled 2024-10-13: qty 90, 30d supply, fill #0
  Filled 2024-11-13: qty 90, 30d supply, fill #1

## 2024-09-21 MED ORDER — METOPROLOL SUCCINATE ER 25 MG PO TB24: 25.0000 mg | ORAL_TABLET | Freq: Every day | ORAL | 3 refills | Status: DC

## 2024-09-21 MED ORDER — PANTOPRAZOLE SODIUM 40 MG PO TBEC: 40.0000 mg | DELAYED_RELEASE_TABLET | Freq: Every day | ORAL | 11 refills | Status: AC

## 2024-09-21 NOTE — Progress Notes (Unsigned)
 Cardiology Office Note   Date:  09/23/2024  ID:  Eric [facility], DOB [birth year], MRN [id number] PCP: Cristopher Suzen HERO, NP  [facility] Providers Cardiologist:  Alm Clay, MD     History of Present Illness Eric Savage is a 58 y.o. male seen at the request of Dr Clay to consider CTO PCI. He has a history of HTN, HLD, tobacco abuse and obesity. He is s/p stenting of the RCA (3.0 x 33 mm Cypher stent post dilated to 3.7) and LCx in 2010 ( 2.75 x 15 mm Xience). Subsequent Myoview  in 2012 was normal. Heart caths in 2013 and 2014 were OK. In 2020 he was diagnosed with paroxysmal Afib with RVR. He was  seen on 01/28/2024 by Dr. Clay and endorsed chest pain and shortness of breath over the past 6 to 8 months.  He had LHC ordered for further evaluation and to provide clearance for dental procedure.  He underwent left heart cath on 02/03/2024 showing 60% stenosed proximal LAD with RFR showing nonsignificant, 65% distal circumflex lesion also nonsignificant with 80% proximal RCA stenosis and 100% stenosed mid RCA with attempt of balloon angioplasty but aborted due to unable to cross lesion.  He had antianginal was titrated but he has continued to have angina. TTE was completed on 02/04/2024 with EF of 60-65% and grade 1 DD and no significant valve abnormalities.  He tells me that he continues to have chest pain with exertion such as taking out the trash. Relieved with rest and sl Ntg but sometimes takes more than a few minutes. He is on nitrates, beta blocker and Ranexa .   He did undergo successful CTO PCI on 09/09/24. This was a long complex procedure and he required 4 stents but ultimately successful.   On follow up he notes his anginal symptoms have resolved. He did have a moderate amount of bruising in his groins L>R. Since his procedure he has noted 2 episodes of Afib with RVR lasting 45 minutes. He did not resume Xarelto  post procedure although instructed to. He notes his acid  reflux symptoms are worse since switching from omeprazole to Protonix  due to interaction with Plavix .   ROS: as noted in HPI. No bleeding issues.  Studies Reviewed      Echo 02/04/24: IMPRESSIONS     1. Left ventricular ejection fraction, by estimation, is 60 to 65%. The  left ventricle has normal function. The left ventricle has no regional  wall motion abnormalities. Left ventricular diastolic parameters are  consistent with Grade I diastolic  dysfunction (impaired relaxation).   2. Right ventricular systolic function is normal. The right ventricular  size is normal.   3. Left atrial size was mildly dilated.   4. The mitral valve is normal in structure. Trivial mitral valve  regurgitation. No evidence of mitral stenosis.   5. The aortic valve is tricuspid. Aortic valve regurgitation is not  visualized. No aortic stenosis is present.   6. The inferior vena cava is normal in size with greater than 50%  respiratory variability, suggesting right atrial pressure of 3 mmHg.   Comparison(s): No significant change from prior study. Prior images  reviewed side by side. Mitral insufficiency is less prominent on the  current study.   Cardiac cath 02/03/24:  LEFT HEART CATH AND CORONARY ANGIOGRAPHY  CORONARY PRESSURE/FFR STUDY   Conclusion      Prox LAD lesion is 60% stenosed.  (RFR 0.93-NOT a Significant)   Previously placed Prox Cx to Dist Cx  stent of unknown type is  widely patent.   -------------------------   Dist Cx lesion is 65% stenosed.  (RFR 0.98-NOT SIGNIFICANT)   -------------------------   Prox RCA lesion is 80% stenosed.   Previously placed Prox RCA to Dist RCA lesion is 100% stenosed-mid stent.  Balloon angioplasty was attempted-aborted; unable to cross lesion with wire; L-R collaterals fill rPDA   Unsuccessful Balloon angioplasty was performed.   -------------------------   The left ventricular systolic function is normal.  LVEF 50 and 55%.  Moderately elevated  LVEDP   There is no aortic valve stenosis.   Dominance: Right  POST-OPERATIVE DIAGNOSIS:  3 Vessel CAD: Proximal RCA 80% proximal edge of previous stent with 100% CTO in the midportion of the stent.  The PDA and PLV with PL were perfused via left-to-right collaterals from the LCx and septal perforators Unable to cross lesion with 2 separate wires Focal eccentric 60 % smooth lesion in the proximal LAD with TIMI-3 flow distally-RFR 0.94 (not significant) Widely patent proximal LCx stent with 65 % stenosis just distal to the stent prior to bifurcation into OM2 (terminal LCx) and OM 3-RFR 0.98 Preserved LVEF, but unable to fully visualize for wall motion abnormality.  Echo pending LVEDP 22 mmHg     RECOMMENDATIONS   In the absence of any other complications or medical issues, we expect the patient to be ready for discharge from a cath perspective on 02/04/2024.   Titrate antianginals: Replace ACE inhibitor with amlodipine  5 mg daily, and increase Imdur  to 30 mg daily.  Low threshold to initiate Ranexa . Reduce/discontinue beta-blocker in setting of bradycardia   Recommend to resume Rivaroxaban , at currently prescribed dose and frequency.   Recommend concurrent antiplatelet therapy of Aspirin  81 mg daily for 6 months.    CTO PCI 09/09/24:  CORONARY CTO INTERVENTION   Conclusion      Prox RCA lesion is 80% stenosed.   Prox RCA to Dist RCA lesion is 100% stenosed.   RPDA lesion is 95% stenosed.   Non-stenotic Prox Cx to Dist Cx lesion was previously treated.   A drug-eluting stent was successfully placed using a STENT SYNERGY XD [id number].   A drug-eluting stent was successfully placed using a STENT SYNERGY XD [id number].   A drug-eluting stent was successfully placed using a STENT MEGATRON 3.5X24.   A drug-eluting stent was successfully placed using a STENT SYNERGY XD 2.25X16.   Post intervention, there is a 0% residual stenosis.   Post intervention, there is a 0% residual stenosis.   Post  intervention, there is a 0% residual stenosis.   Recommend to resume Rivaroxaban , at currently prescribed dose and frequency on 09/10/2024.   Recommend concurrent antiplatelet therapy of Aspirin  81 mg for 1 month and Clopidogrel  75mg  daily for 6 months .   Successful complex PCI of CTO of the RCA with IVUS guidance, multiple stents x 4   Plan: will observe overnight. Plan ASA for one month. May resume Xarelto  tomorrow. Plan Plavix  for a minimum of 6 months but would consider long term if no bleeding given significant number and layers of stent in RCA    Risk Assessment/Calculations  CHA2DS2-VASc Score = 2   This indicates a 2.2% annual risk of stroke. The patient's score is based upon: CHF History: 0 HTN History: 1 Diabetes History: 0 Stroke History: 0 Vascular Disease History: 1 Age Score: 0 Gender Score: 0     Physical Exam VS:  BP (!) 90/50   Pulse (!) 59  Ht 5' 5 (1.651 m)   Wt 194 lb 9.6 oz (88.3 kg)   SpO2 93%   BMI 32.38 kg/m        Wt Readings from Last 3 Encounters:  09/23/24 194 lb 9.6 oz (88.3 kg)  09/09/24 185 lb (83.9 kg)  08/28/24 196 lb (88.9 kg)    GEN: Well nourished, well developed in no acute distress NECK: No JVD; No carotid bruits CARDIAC: RRR, no murmurs, rubs, gallops RESPIRATORY:  Clear to auscultation without rales, wheezing or rhonchi  ABDOMEN: Soft, non-tender, non-distended EXTREMITIES:  No edema; some old bruising L>R no significant hematoma. No bruits.   ASSESSMENT AND PLAN CAD with remote stent of LCx and RCA in 2010 with DES. 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 Cardiac cath in April showed CTO of the mid RCA within the stent. LCA without obstructive disease. He is now s/p CTO PCI with DES x 4 in RCA. Anginal symptoms resolved. Recommend continued DAPT for now. Will stop ASA Jan 3. Continue Plavix  for 6 months. Will add Pepcid  for GERD. Would prefer not to switch Plavix  to another P2Y12 due to the fact he is  on anticoagulation. Will stop Imdur  and Ranexa  now.  AFib paroxysmal. Needs to resume Xarelto . Continue metoprolol . If he has more recurrence consider referral for ablation. Follow up with Dr Anner in March.      Signed, Neithan Day, MD

## 2024-09-22 ENCOUNTER — Other Ambulatory Visit: Payer: Self-pay

## 2024-09-23 ENCOUNTER — Encounter: Payer: Self-pay | Admitting: Cardiology

## 2024-09-23 ENCOUNTER — Other Ambulatory Visit: Payer: Self-pay

## 2024-09-23 ENCOUNTER — Telehealth: Payer: Self-pay

## 2024-09-23 ENCOUNTER — Other Ambulatory Visit (HOSPITAL_COMMUNITY): Payer: Self-pay

## 2024-09-23 ENCOUNTER — Ambulatory Visit: Attending: Cardiology | Admitting: Cardiology

## 2024-09-23 VITALS — BP 90/50 | HR 59 | Ht 65.0 in | Wt 194.6 lb

## 2024-09-23 DIAGNOSIS — I2510 Atherosclerotic heart disease of native coronary artery without angina pectoris: Secondary | ICD-10-CM

## 2024-09-23 DIAGNOSIS — I25118 Atherosclerotic heart disease of native coronary artery with other forms of angina pectoris: Secondary | ICD-10-CM | POA: Diagnosis not present

## 2024-09-23 DIAGNOSIS — I480 Paroxysmal atrial fibrillation: Secondary | ICD-10-CM | POA: Diagnosis not present

## 2024-09-23 DIAGNOSIS — I10 Essential (primary) hypertension: Secondary | ICD-10-CM | POA: Diagnosis not present

## 2024-09-23 DIAGNOSIS — Z9861 Coronary angioplasty status: Secondary | ICD-10-CM | POA: Diagnosis not present

## 2024-09-23 DIAGNOSIS — E785 Hyperlipidemia, unspecified: Secondary | ICD-10-CM

## 2024-09-23 MED ORDER — RIVAROXABAN 20 MG PO TABS
20.0000 mg | ORAL_TABLET | Freq: Every day | ORAL | 2 refills | Status: AC
  Filled 2024-09-23 – 2024-10-27 (×2): qty 30, 30d supply, fill #0

## 2024-09-23 MED ORDER — FAMOTIDINE 20 MG PO TABS
20.0000 mg | ORAL_TABLET | Freq: Every day | ORAL | 3 refills | Status: AC
  Filled 2024-09-23: qty 90, 90d supply, fill #0

## 2024-09-23 NOTE — Telephone Encounter (Signed)
 Spoke to patient I called and spoke to [facility] about your medication packaging.She advised a Pharmacist will be calling you  back about medications to be packaged.She stated you will need to pick up Xarelto  and Pepcid  prescriptions.

## 2024-09-23 NOTE — Addendum Note (Signed)
 Addended by: CHRISTIANNE CHANNING PARAS on: 09/23/2024 03:26 PM   Modules accepted: Orders

## 2024-09-23 NOTE — Patient Instructions (Addendum)
 Medication Instructions:  Start taking Xarelto  20 mg every day after supper Stop Aspirin  on 10/10/24 Stop Ranexa  Stop Imdur  Take Pepcid  20 mg daily Continue taking Plavix  and all other medications *If you need a refill on your cardiac medications before your next appointment, please call your pharmacy*  Lab Work: None ordered  Testing/Procedures: None ordered  Follow-Up: At [facility], you and your health needs are our priority.  As part of our continuing mission to provide you with exceptional heart care, our providers are all part of one team.  This team includes your primary Cardiologist (physician) and Advanced Practice Providers or APPs (Physician Assistants and Nurse Practitioners) who all work together to provide you with the care you need, when you need it.  Your next appointment: Friday 3/13 at 9:00 am    Provider:  Dr.[name]   We recommend signing up for the patient portal called MyChart.  Sign up information is provided on this After Visit Summary.  MyChart is used to connect with patients for Virtual Visits (Telemedicine).  Patients are able to view lab/test results, encounter notes, upcoming appointments, etc.  Non-urgent messages can be sent to your provider as well.   To learn more about what you can do with MyChart, go to [email or web address].

## 2024-09-28 ENCOUNTER — Other Ambulatory Visit: Payer: Self-pay

## 2024-09-28 ENCOUNTER — Other Ambulatory Visit (HOSPITAL_COMMUNITY): Payer: Self-pay

## 2024-09-30 ENCOUNTER — Other Ambulatory Visit (HOSPITAL_COMMUNITY): Payer: Self-pay

## 2024-10-13 ENCOUNTER — Other Ambulatory Visit (HOSPITAL_COMMUNITY): Payer: Self-pay

## 2024-10-27 ENCOUNTER — Other Ambulatory Visit (HOSPITAL_COMMUNITY): Payer: Self-pay

## 2024-10-27 ENCOUNTER — Other Ambulatory Visit: Payer: Self-pay

## 2024-11-05 ENCOUNTER — Telehealth: Payer: Self-pay | Admitting: Cardiology

## 2024-11-05 NOTE — Telephone Encounter (Signed)
 Patient switched from omeprazole to famotidine  in Novemebr 2025. Patient states this medication has not been effective at all in treating his heartburn.  Patient states he will take several tablets a day, about 3 tablets daily and this will help some.  Patient is asking for a different medication for heartburn and requests Rx be sent to [facility] on [address]  Will forward to Dr. Jordan and Pharm D to review.

## 2024-11-05 NOTE — Telephone Encounter (Signed)
 Pt c/o medication issue:  1. Name of Medication: famotidine  (PEPCID ) 20 MG tablet   2. How are you currently taking this medication (dosage and times per day)? As written   3. Are you having a reaction (difficulty breathing--STAT)? No  4. What is your medication issue? Pt states this medication is causing heartburn

## 2024-11-06 NOTE — Telephone Encounter (Signed)
 Omeprazole was discontinued due to interaction with Plavix . Patient also said pantoprazole  was ineffective. Can increase famotidine  to 20mg  BID. Recommend GI referral.

## 2024-11-06 NOTE — Telephone Encounter (Signed)
 Discussed recommendation from Pharm D with patient: Pavero, Christopher, [facility]  Pharmacist Pharmacy   Creation Time: 11/06/2024  8:45 AM   Signed     Omeprazole was discontinued due to interaction with Plavix . Patient also said pantoprazole  was ineffective. Can increase famotidine  to 20mg  BID. Recommend GI referral.          Patient states he has been taking Famotidine  20 mg 2-3 times a day already with no improvement in heartburn. Patient is already established with GI, last seen 6 months ago.   Advised patient to contact GI office and discuss alternative, making them aware he is on Plavix .  Will forward to Dr. Jordan again to see if he has any other recommendations.

## 2024-11-10 NOTE — Telephone Encounter (Signed)
 Spoke to patient Dr.Jordan's advice given.Stated he spoke to GI Dr this morning she prescribed Protonix  .

## 2024-11-13 ENCOUNTER — Other Ambulatory Visit (HOSPITAL_COMMUNITY): Payer: Self-pay

## 2024-12-18 ENCOUNTER — Ambulatory Visit: Admitting: Cardiology
